# Patient Record
Sex: Female | Born: 1935 | Race: White | Hispanic: No | Smoking: Former smoker
Health system: Southern US, Community
[De-identification: ages and names within clinical notes are randomized; demographics above are authoritative.]

## PROBLEM LIST (undated history)

## (undated) DIAGNOSIS — H547 Unspecified visual loss: Secondary | ICD-10-CM

## (undated) DIAGNOSIS — K219 Gastro-esophageal reflux disease without esophagitis: Secondary | ICD-10-CM

## (undated) DIAGNOSIS — Z8719 Personal history of other diseases of the digestive system: Secondary | ICD-10-CM

## (undated) DIAGNOSIS — U071 COVID-19: Secondary | ICD-10-CM

## (undated) DIAGNOSIS — I209 Angina pectoris, unspecified: Secondary | ICD-10-CM

## (undated) DIAGNOSIS — H9190 Unspecified hearing loss, unspecified ear: Secondary | ICD-10-CM

## (undated) DIAGNOSIS — H548 Legal blindness, as defined in USA: Secondary | ICD-10-CM

## (undated) DIAGNOSIS — I10 Essential (primary) hypertension: Secondary | ICD-10-CM

## (undated) DIAGNOSIS — J449 Chronic obstructive pulmonary disease, unspecified: Secondary | ICD-10-CM

---

## 2001-05-17 ENCOUNTER — Ambulatory Visit (HOSPITAL_COMMUNITY): Admission: RE | Admit: 2001-05-17 | Discharge: 2001-05-17 | Payer: Self-pay | Admitting: Family Medicine

## 2001-05-17 ENCOUNTER — Encounter: Payer: Self-pay | Admitting: Family Medicine

## 2002-05-09 ENCOUNTER — Ambulatory Visit (HOSPITAL_COMMUNITY): Admission: RE | Admit: 2002-05-09 | Discharge: 2002-05-09 | Payer: Self-pay | Admitting: Ophthalmology

## 2002-06-29 ENCOUNTER — Encounter: Payer: Self-pay | Admitting: Family Medicine

## 2002-06-29 ENCOUNTER — Ambulatory Visit (HOSPITAL_COMMUNITY): Admission: RE | Admit: 2002-06-29 | Discharge: 2002-06-29 | Payer: Self-pay | Admitting: Family Medicine

## 2002-09-06 ENCOUNTER — Ambulatory Visit (HOSPITAL_COMMUNITY): Admission: RE | Admit: 2002-09-06 | Discharge: 2002-09-06 | Payer: Self-pay | Admitting: Family Medicine

## 2002-09-06 ENCOUNTER — Encounter: Payer: Self-pay | Admitting: Family Medicine

## 2003-07-19 ENCOUNTER — Ambulatory Visit (HOSPITAL_COMMUNITY): Admission: RE | Admit: 2003-07-19 | Discharge: 2003-07-19 | Payer: Self-pay | Admitting: Family Medicine

## 2003-07-19 ENCOUNTER — Encounter: Payer: Self-pay | Admitting: Family Medicine

## 2003-07-31 ENCOUNTER — Ambulatory Visit (HOSPITAL_COMMUNITY): Admission: RE | Admit: 2003-07-31 | Discharge: 2003-07-31 | Payer: Self-pay | Admitting: Family Medicine

## 2003-07-31 ENCOUNTER — Encounter: Payer: Self-pay | Admitting: Family Medicine

## 2003-10-09 ENCOUNTER — Ambulatory Visit (HOSPITAL_COMMUNITY): Admission: RE | Admit: 2003-10-09 | Discharge: 2003-10-09 | Payer: Self-pay | Admitting: Family Medicine

## 2004-10-21 ENCOUNTER — Ambulatory Visit (HOSPITAL_COMMUNITY): Admission: RE | Admit: 2004-10-21 | Discharge: 2004-10-21 | Payer: Self-pay | Admitting: Family Medicine

## 2004-12-31 ENCOUNTER — Ambulatory Visit (HOSPITAL_COMMUNITY): Admission: RE | Admit: 2004-12-31 | Discharge: 2004-12-31 | Payer: Self-pay | Admitting: Family Medicine

## 2005-10-28 ENCOUNTER — Ambulatory Visit (HOSPITAL_COMMUNITY): Admission: RE | Admit: 2005-10-28 | Discharge: 2005-10-28 | Payer: Self-pay | Admitting: Family Medicine

## 2005-11-04 ENCOUNTER — Ambulatory Visit (HOSPITAL_COMMUNITY): Payer: Self-pay | Admitting: Oncology

## 2005-11-04 ENCOUNTER — Encounter: Admission: RE | Admit: 2005-11-04 | Discharge: 2005-11-04 | Payer: Self-pay | Admitting: Oncology

## 2005-11-04 ENCOUNTER — Encounter (HOSPITAL_COMMUNITY): Admission: RE | Admit: 2005-11-04 | Discharge: 2005-12-04 | Payer: Self-pay | Admitting: Oncology

## 2005-11-10 ENCOUNTER — Encounter (HOSPITAL_COMMUNITY): Payer: Self-pay | Admitting: Oncology

## 2005-11-13 ENCOUNTER — Ambulatory Visit (HOSPITAL_COMMUNITY): Admission: RE | Admit: 2005-11-13 | Discharge: 2005-11-13 | Payer: Self-pay | Admitting: Oncology

## 2005-12-22 ENCOUNTER — Ambulatory Visit (HOSPITAL_COMMUNITY): Admission: RE | Admit: 2005-12-22 | Discharge: 2005-12-22 | Payer: Self-pay | Admitting: Family Medicine

## 2006-02-08 ENCOUNTER — Ambulatory Visit (HOSPITAL_COMMUNITY): Admission: RE | Admit: 2006-02-08 | Discharge: 2006-02-08 | Payer: Self-pay | Admitting: Family Medicine

## 2006-02-23 ENCOUNTER — Encounter (HOSPITAL_COMMUNITY): Admission: RE | Admit: 2006-02-23 | Discharge: 2006-03-25 | Payer: Self-pay | Admitting: Oncology

## 2006-02-23 ENCOUNTER — Encounter: Admission: RE | Admit: 2006-02-23 | Discharge: 2006-02-23 | Payer: Self-pay | Admitting: Oncology

## 2006-02-23 ENCOUNTER — Ambulatory Visit (HOSPITAL_COMMUNITY): Payer: Self-pay | Admitting: Oncology

## 2006-05-17 ENCOUNTER — Encounter: Admission: RE | Admit: 2006-05-17 | Discharge: 2006-05-17 | Payer: Self-pay | Admitting: Oncology

## 2006-05-17 ENCOUNTER — Ambulatory Visit (HOSPITAL_COMMUNITY): Payer: Self-pay | Admitting: Oncology

## 2006-05-17 ENCOUNTER — Encounter (HOSPITAL_COMMUNITY): Admission: RE | Admit: 2006-05-17 | Discharge: 2006-06-16 | Payer: Self-pay | Admitting: Oncology

## 2006-11-22 ENCOUNTER — Ambulatory Visit (HOSPITAL_COMMUNITY): Payer: Self-pay | Admitting: Oncology

## 2006-11-22 ENCOUNTER — Encounter (HOSPITAL_COMMUNITY): Admission: RE | Admit: 2006-11-22 | Discharge: 2006-12-22 | Payer: Self-pay | Admitting: Oncology

## 2006-12-23 ENCOUNTER — Ambulatory Visit (HOSPITAL_COMMUNITY): Admission: RE | Admit: 2006-12-23 | Discharge: 2006-12-23 | Payer: Self-pay | Admitting: Family Medicine

## 2007-05-18 ENCOUNTER — Encounter (HOSPITAL_COMMUNITY): Admission: RE | Admit: 2007-05-18 | Discharge: 2007-06-17 | Payer: Self-pay | Admitting: Oncology

## 2007-05-18 ENCOUNTER — Ambulatory Visit (HOSPITAL_COMMUNITY): Payer: Self-pay | Admitting: Oncology

## 2007-11-16 ENCOUNTER — Ambulatory Visit (HOSPITAL_COMMUNITY): Payer: Self-pay | Admitting: Oncology

## 2007-11-16 ENCOUNTER — Encounter (HOSPITAL_COMMUNITY): Admission: RE | Admit: 2007-11-16 | Discharge: 2007-12-16 | Payer: Self-pay | Admitting: Oncology

## 2007-12-26 ENCOUNTER — Ambulatory Visit (HOSPITAL_COMMUNITY): Admission: RE | Admit: 2007-12-26 | Discharge: 2007-12-26 | Payer: Self-pay | Admitting: Family Medicine

## 2008-03-06 ENCOUNTER — Ambulatory Visit (HOSPITAL_COMMUNITY): Admission: RE | Admit: 2008-03-06 | Discharge: 2008-03-06 | Payer: Self-pay | Admitting: Family Medicine

## 2008-03-25 ENCOUNTER — Emergency Department (HOSPITAL_COMMUNITY): Admission: EM | Admit: 2008-03-25 | Discharge: 2008-03-26 | Payer: Self-pay | Admitting: Emergency Medicine

## 2008-03-29 ENCOUNTER — Ambulatory Visit (HOSPITAL_COMMUNITY): Admission: RE | Admit: 2008-03-29 | Discharge: 2008-03-29 | Payer: Self-pay | Admitting: Urology

## 2008-04-01 ENCOUNTER — Inpatient Hospital Stay (HOSPITAL_COMMUNITY): Admission: EM | Admit: 2008-04-01 | Discharge: 2008-04-03 | Payer: Self-pay | Admitting: Emergency Medicine

## 2008-04-12 ENCOUNTER — Ambulatory Visit (HOSPITAL_BASED_OUTPATIENT_CLINIC_OR_DEPARTMENT_OTHER): Admission: RE | Admit: 2008-04-12 | Discharge: 2008-04-12 | Payer: Self-pay | Admitting: Urology

## 2008-05-15 ENCOUNTER — Encounter (HOSPITAL_COMMUNITY): Admission: RE | Admit: 2008-05-15 | Discharge: 2008-06-14 | Payer: Self-pay | Admitting: Oncology

## 2008-05-15 ENCOUNTER — Ambulatory Visit (HOSPITAL_COMMUNITY): Payer: Self-pay | Admitting: Oncology

## 2008-10-30 ENCOUNTER — Ambulatory Visit (HOSPITAL_COMMUNITY): Payer: Self-pay | Admitting: Oncology

## 2008-10-30 ENCOUNTER — Encounter (HOSPITAL_COMMUNITY): Admission: RE | Admit: 2008-10-30 | Discharge: 2008-11-29 | Payer: Self-pay | Admitting: Oncology

## 2008-12-31 ENCOUNTER — Ambulatory Visit (HOSPITAL_COMMUNITY): Admission: RE | Admit: 2008-12-31 | Discharge: 2008-12-31 | Payer: Self-pay | Admitting: Family Medicine

## 2009-05-22 ENCOUNTER — Ambulatory Visit (HOSPITAL_COMMUNITY): Admission: RE | Admit: 2009-05-22 | Discharge: 2009-05-22 | Payer: Self-pay | Admitting: Family Medicine

## 2009-05-27 ENCOUNTER — Ambulatory Visit (HOSPITAL_COMMUNITY): Admission: RE | Admit: 2009-05-27 | Discharge: 2009-05-27 | Payer: Self-pay | Admitting: Family Medicine

## 2009-07-03 ENCOUNTER — Encounter (INDEPENDENT_AMBULATORY_CARE_PROVIDER_SITE_OTHER): Payer: Self-pay | Admitting: *Deleted

## 2009-07-11 ENCOUNTER — Encounter (INDEPENDENT_AMBULATORY_CARE_PROVIDER_SITE_OTHER): Payer: Self-pay | Admitting: *Deleted

## 2010-01-02 ENCOUNTER — Ambulatory Visit (HOSPITAL_COMMUNITY): Admission: RE | Admit: 2010-01-02 | Discharge: 2010-01-02 | Payer: Self-pay | Admitting: Urology

## 2010-01-14 ENCOUNTER — Ambulatory Visit (HOSPITAL_COMMUNITY): Admission: RE | Admit: 2010-01-14 | Discharge: 2010-01-14 | Payer: Self-pay | Admitting: Family Medicine

## 2010-04-22 ENCOUNTER — Ambulatory Visit (HOSPITAL_COMMUNITY): Admission: RE | Admit: 2010-04-22 | Discharge: 2010-04-22 | Payer: Self-pay | Admitting: Family Medicine

## 2010-12-29 ENCOUNTER — Other Ambulatory Visit (HOSPITAL_COMMUNITY): Payer: Self-pay | Admitting: Family Medicine

## 2010-12-29 DIAGNOSIS — Z139 Encounter for screening, unspecified: Secondary | ICD-10-CM

## 2011-01-25 LAB — CREATININE, SERUM: Creatinine, Ser: 0.69 mg/dL (ref 0.4–1.2)

## 2011-02-03 ENCOUNTER — Ambulatory Visit (HOSPITAL_COMMUNITY)
Admission: RE | Admit: 2011-02-03 | Discharge: 2011-02-03 | Disposition: A | Discharge status: Home / Self Care | Payer: Medicare Other | Source: Ambulatory Visit | Attending: Family Medicine | Admitting: Family Medicine

## 2011-02-03 DIAGNOSIS — Z1231 Encounter for screening mammogram for malignant neoplasm of breast: Secondary | ICD-10-CM | POA: Insufficient documentation

## 2011-02-03 DIAGNOSIS — Z139 Encounter for screening, unspecified: Secondary | ICD-10-CM

## 2011-02-06 ENCOUNTER — Other Ambulatory Visit: Payer: Self-pay | Admitting: Family Medicine

## 2011-02-06 DIAGNOSIS — R928 Other abnormal and inconclusive findings on diagnostic imaging of breast: Secondary | ICD-10-CM

## 2011-02-11 ENCOUNTER — Other Ambulatory Visit: Payer: Self-pay | Admitting: Family Medicine

## 2011-02-11 DIAGNOSIS — R928 Other abnormal and inconclusive findings on diagnostic imaging of breast: Secondary | ICD-10-CM

## 2011-02-17 ENCOUNTER — Other Ambulatory Visit (HOSPITAL_COMMUNITY): Payer: Self-pay | Admitting: Family Medicine

## 2011-02-17 DIAGNOSIS — R928 Other abnormal and inconclusive findings on diagnostic imaging of breast: Secondary | ICD-10-CM

## 2011-02-18 ENCOUNTER — Ambulatory Visit (HOSPITAL_COMMUNITY)
Admission: RE | Admit: 2011-02-18 | Discharge: 2011-02-18 | Disposition: A | Discharge status: Home / Self Care | Payer: Medicare Other | Source: Ambulatory Visit | Attending: Family Medicine | Admitting: Family Medicine

## 2011-02-18 DIAGNOSIS — R928 Other abnormal and inconclusive findings on diagnostic imaging of breast: Secondary | ICD-10-CM | POA: Insufficient documentation

## 2011-03-17 NOTE — Group Therapy Note (Signed)
[name], ROSELAND                ACCOUNT NO.:  [id number]   MEDICAL RECORD NO.:  [id number]          PATIENT TYPE:  INP   LOCATION:  A312                          FACILITY:  [facility]   PHYSICIAN:  Margaretmary Dys, M.D.DATE OF BIRTH:  Mar 20, 1926   DATE OF PROCEDURE:  04/02/2008  DATE OF DISCHARGE:                                 PROGRESS NOTE   SUBJECTIVE:  Patient remains stable.   She says her cough is much better.  Her shortness of breath is better.  The patient continues to smoke and said she clearly will not be able to  stop smoking.  She has no fevers or chills.   OBJECTIVE:  Conscious, alert, comfortable not in acute distress.  VITAL SIGNS:  Blood pressure is 140/57 with a pulse of 66, respirations  18, temperature 97.5, oxygen saturation was 95% on room air.  HEENT EXAM:  Normocephalic, atraumatic.  Oral mucosa was moist with no  exudates.  NECK:  Supple.  No JVD, no lymphadenopathy.  LUNGS:  Reduced air entry bilaterally with occasional wheezing at the  bases.  HEART:  S1-S2 regular.  No S3, S4, gallops or rubs.  ABDOMEN:  Was soft, nontender.  Bowel sounds positive.  No masses  palpable.  EXTREMITIES:  No pitting pedal edema.  No calf induration or tenderness  was noted.   LABORATORY/DIAGNOSTIC DATA:  White blood cell count 15.3, hemoglobin of  12.1, hematocrit 35.2, platelet count 406 with 91% neutrophils.  Sodium  142, potassium 3.4, chloride 108, CO2 28, glucose 123, BUN 15,  creatinine was 0.90.  Blood cultures and urine cultures remain negative  thus far.   ASSESSMENT/PLAN:  1. Acute chronic obstructive pulmonary disease exacerbation with      probable pneumonia.  The patient continues to improve.  The patient      is not hypoxic.  Will continue nebulizer treatment, Solu-Medrol,      and antibiotics.  2. History of hypertension.  The patient's blood pressure is stable at      this time.  3. History of congestive heart failure.  The patient is not in any  evidence of pulmonary edema.   DISPOSITION:  The patient will likely be discharged home tomorrow on a  rapid taper of steroids and also on oral antibiotic therapy.      Margaretmary Dys, M.D.  Electronically Signed     AM/MEDQ  D:  04/02/2008  T:  04/02/2008  Job:  [id number]

## 2011-03-17 NOTE — Discharge Summary (Signed)
[name], [name]                ACCOUNT NO.:  [id number]   MEDICAL RECORD NO.:  [id number]          PATIENT TYPE:  INP   LOCATION:  A312                          FACILITY:  [facility]   PHYSICIAN:  Margaretmary Dys, M.D.DATE OF BIRTH:  08/19/1926   DATE OF ADMISSION:  03/31/2008  DATE OF DISCHARGE:  06/02/2009LH                               DISCHARGE SUMMARY   DISCHARGE DIAGNOSES:  1. Acute chronic obstructive pulmonary disease exacerbation.  2. Pneumonia, possibly right lower lobe pneumonia.   OTHER DIAGNOSES OF NOTE:  1. Chronic chronic obstructive pulmonary disease.  2. Congestive heart failure.  3. Hypertension.  4. Anemia.  5. Coronary artery disease.  6. Glaucoma.  7. History of kidney stones.  8. Macular degeneration.  9. Osteoporosis.  10.Urinary tract infection.  11.Vitamin B12 deficiency.   DISCHARGE MEDICATIONS:  1. Levaquin 750 mg p.o. once a day for the next 5 days.  2. Albuterol inhaler 2.5 mg q.4 h.  3. Aspirin 81 mg p.o. once a day.  4. Diltiazem 180 mg p.o. once a day.  5. Hydrochlorothiazide 5 mg p.o. once a day.  6. Atrovent 0.5 mg q.4 h. p.r.n. as needed.  7. Alphagan ophthalmic drops 0.15% twice a day.  8. Calcium 600 mg p.o. daily.  9. Clarinex 5 mg daily.  10.Lasix 20 mg daily.  11.Vitamin B12 specialized dosing.  12.Centrum tablet once a day.  13.Betimol 0.5 mg eye drops.  14.Timolol drop 1 b.i.d.   CONSULTATIONS OBTAINED:  None.   PERTINENT LABORATORY DATA ON ADMISSION:  The patient's white blood cell  count is 11.6.  Hemoglobin of 11.5.  Hematocrit of 33.  Platelet count  was 406.  Blood cultures were negative.  Sodium was 135, potassium 3.7.  Chloride was 103.  CO2 was 26.  Glucose 111.  BUN of 17.  Creatinine of  0.9.  Chest x-ray with peribronchial thickening and interstitial  prominence.  There was bibasilar atelectasis on infiltrate, small  bilateral effusions and evidence of chronic COPD   HOSPITAL COURSE:  [name] is a  77 year old female who presented to  the emergency room with complaints of cough and sputum production.  The  patient reports that it has progressively gotten severe over the last  few days.  She denies any fevers or chills.  She had no pleuritic chest  pain.   The patient was subsequently admitted, based on the evaluation and  laboratory data mentioned above for suspected chronic obstructive  pulmonary disease.  The patient had bilateral wheezing with rhonchi.  The patient was given nebulizers and also started on steroids, with  improvement in her symptoms.  I saw the patient on April 02, 2008.  The  patient was doing much better.  White count had remained stable.  She  had no evidence of congestive heart failure.  On November 04, 2007 the  patient was seen and did fairly well, was subsequently discharged home  in satisfactory state.   DISPOSITION:  Discharge home.   FOLLOWUP:  The patient is to follow up with her primary care physician,  Dr. Nobie Putnam in the next  3-4 weeks.      Margaretmary Dys, M.D.  Electronically Signed     AM/MEDQ  D:  05/23/2008  T:  05/23/2008  Job:  [id number]

## 2011-03-17 NOTE — H&P (Signed)
[name], [name]                ACCOUNT NO.:  [id number]   MEDICAL RECORD NO.:  [id number]          PATIENT TYPE:  INP   LOCATION:  A312                          FACILITY:  [facility]   PHYSICIAN:  Skeet Latch, DO    DATE OF BIRTH:  Sep 14, 1926   DATE OF ADMISSION:  03/31/2008  DATE OF DISCHARGE:  LH                              HISTORY & PHYSICAL   PRIMARY CARE PHYSICIAN:  Dr. Nobie Putnam   CHIEF COMPLAINT:  Cough.   HISTORY OF PRESENT ILLNESS:  This is a 77 year old Caucasian female who  presents with complaint of cough.  The patient states for last few days  she started having an off-and-on productive cough.  The patient states  that it has become severe in nature and decided to come to the emergency  room to be evaluated.  The patient denies any chest pain, nausea,  vomiting, diarrhea or abdominal discomfort.  The patient does have a  history of CHF and COPD.   PAST MEDICAL HISTORY:  Includes:  1. CHF.  2. COPD.  3. Hypertension.  4. Anemia.  5. CAD.  6. Glaucoma.  7. Kidney stones.  8. Macular degeneration.  9. Osteoporosis.  10.Urinary tract infections.  11.Vitamin B12 deficiency.   PAST SURGICAL HISTORY:  Positive for breast lumpectomy and lithotripsy.   SOCIAL HISTORY:  Nondrinker, no history of illicit drug use.  The  patient has been a 60-plus-year one-pack-per-day smoker, states that she  cut down to approximately five to six cigarettes per day over the last  few months.  The patient does live alone.   ALLERGIES:  1. PENICILLIN.  2. SULFA.  3. BONIVA.  4. STRAWBERRIES.  5. NITROFURANTOIN.   HOME MEDICATIONS:  1. Albuterol inhaler as needed.  2. Aspirin 81 mg daily.  3. Diltiazem 180 mg daily.  4. Hydrochlorothiazide 25 mg daily.  5. Ipratropium bromide inhaler as needed.  6. Alphagan ophthalmic drops 0.15% twice a day.  7. Calcium 600 mg daily.  8. Clarinex 5 mg daily.  9. Lasix 20 mg daily.  10.Vitamin B12 specialized dosing.  11.Cipro 500 mg twice  a day.  12.PreserVision tablets p.o. daily.  13.Centrum tablet daily.  14.Refresh P.M. at night.  15.Soothe XP.  16.Betimol 0.5% drops.  17.Timolol maleate one drop twice a day.   REVIEW OF SYSTEMS:  CONSTITUTIONAL:  No weight gain, weight loss, fever,  chills.  HEENT:  Unremarkable.  CARDIOVASCULAR:  No palpitations, chest  pain.  RESPIRATORY:  Positive for cough and some shortness of breath.  GASTROINTESTINAL:  No nausea, vomiting, diarrhea, abdominal pain, bloody  stools.  GENITOURINARY:  Unremarkable.  MUSCULOSKELETAL:  Unremarkable.  SKIN:  Unremarkable.  NEUROLOGIC:  Unremarkable.  PSYCHIATRIC:  Unremarkable.   PHYSICAL EXAMINATION:  VITAL SIGNS:  Temperature is 97.6, pulse 83,  respirations 20, blood pressure 127/58, saturating 93% on room air.  GENERAL:  Well-developed, well-nourished, well-hydrated, in no acute  distress.  HEENT:  Head is atraumatic, normocephalic.  Eyes:  PERRL.  EOMI.  Neck:  Soft, supple, nontender, nondistended.  She does have some redness  around both eyes with slight  scleral injection noted.  CARDIOVASCULAR:  Regular rate and rhythm.  No murmurs, rubs, gallops.  RESPIRATORY:  She has rales, positive wheezing bilaterally with rhonchi.  ABDOMEN:  Soft, nontender, nondistended.  Positive bowel sounds.  No  rigidity or guarding.  EXTREMITIES:  No clubbing, cyanosis or edema.  NEUROLOGIC:  Cranial nerves II-XII grossly intact.  SKIN:  Warm, good color.   LABORATORIES:  PTT is 38, PT 15.5, INR is 1.2.  White count 11.6,  hemoglobin 11.5, hematocrit 33.0, platelets 406.  So far, blood cultures  are negative.  Sodium 135, potassium 3.7, chloride 103, CO2 26, glucose  111, BUN 17, creatinine 0.9.   RADIOLOGIC STUDIES:  Chest x-ray shows:  1. Stable cardiomegaly, peribronchial thickening, and interstitial      prominence.  2. Bibasilar atelectasis or infiltrates, right greater than left.  3. Small bilateral effusions.  4. COPD.   ASSESSMENT:  1.  Pneumonia.  2. History chronic obstructive pulmonary disease.  3. History of congestive heart failure.  4. History of hypertension.  5. History of anemia.  6. History of coronary artery disease.  7. History of glaucoma.   PLAN:  1. The patient be admitted to the service of [facility].  2. For her pneumonia, the patient will be placed on IV antibiotics.      Will get sputum cultures and sensitivities.  Will also add blood      cultures x2.  The patient will receive breathing treatments every 4      hours with Xopenex and Atrovent and as needed.  We will place      incentive spirometry next to the patient's bedside.  3. Will add some IV steroids to her regimen, low-dose at this time, to      see if this improves some of her breathing difficulties.  4. For her other medical problems which include her CHF, hypertension      and CAD, the patient will placed on her home medications at this      time.  5. Lastly, the patient will placed on DVT as well as GI prophylaxis.      Skeet Latch, DO  Electronically Signed     SM/MEDQ  D:  04/01/2008  T:  04/01/2008  Job:  [id number]   cc:   Patrica Duel, M.D.  Fax: [phone]

## 2011-03-17 NOTE — Group Therapy Note (Signed)
NAMEMICHELYN, [name]                ACCOUNT NO.:  [id number]   MEDICAL RECORD NO.:  [id number]          PATIENT TYPE:  INP   LOCATION:  A312                          FACILITY:  [facility]   PHYSICIAN:  Lucita Ferrara, MD         DATE OF BIRTH:  1926-04-06   DATE OF PROCEDURE:  04/01/2008  DATE OF DISCHARGE:                                 PROGRESS NOTE   Patient examined by bedside.  Full history and physical examination by  Dr. Skeet Latch reviewed.  The patient was admitted last night with  cough, shortness of breath.  Her symptoms started after she had some  sort of anaphylactic reaction or allergic reaction to Macrodantin.  Note, she has had recent urological procedures for stone.  She had  lithotripsy.  She apparently had some hydronephrosis, she denies any  urinary symptoms as of now.   OBJECTIVE:  VITALS:  Temperature 97.5, pulse 76, respirations 22, blood  pressure 122/59.  HEENT:  Normocephalic, atraumatic.  Sclerae anicteric.  NECK:  Supple, no JVD, no carotid bruits.  CARDIOVASCULAR:  S1, S2, regular rate, rhythm.  No murmurs, rubs or  clicks.  ABDOMEN:  Soft, nontender, nondistended, positive bowel sounds.  LUNGS:  Clear to auscultation bilaterally.  No rhonchi, rales or  wheezes.  EXTREMITIES:  No clubbing, cyanosis or edema.   LABORATORY DATA:  BMET within normal limits.  INR 1.2.  CBC hemoglobin  11.5, hematocrit 33.   ASSESSMENT/PLAN:  Admitted with questionable pneumonia and chronic  obstructive pulmonary disease exacerbation.  Her chest x-ray did show  bilateral atelectasis or infiltrates, right greater than left, there is  peribronchial thickening and interstitial prominence.  Will continue  current treatment with nebulizer treatment, Solu-Medrol, continue  antibiotics.  Sputum cultures and sensitivities pending.  Continue  Xopenex and Solu-Medrol.  It seems that her congestive heart failure is  stable.  Will get beta-natriuretic peptide regardless.  In addition,  will watch her renal function, stable now; per family member, she does  have a hydronephrosis.  Will monitor her renal function and if it  deteriorates,  she is requesting to be transferred to [facility] for  treatment.      Lucita Ferrara, MD  Electronically Signed     RR/MEDQ  D:  04/01/2008  T:  04/01/2008  Job:  [id number]

## 2011-03-17 NOTE — Op Note (Signed)
NAMEJODILYN, [name]                ACCOUNT NO.:  [id number]   MEDICAL RECORD NO.:  [id number]          PATIENT TYPE:  AMB   LOCATION:  NESC                         FACILITY:  [facility]   PHYSICIAN:  Heloise Purpura, MD      DATE OF BIRTH:  1926/03/10   DATE OF PROCEDURE:  04/12/2008  DATE OF DISCHARGE:                               OPERATIVE REPORT   PREOPERATIVE DIAGNOSIS:  Left ureteral calculus.   POSTOPERATIVE DIAGNOSIS:  Left ureteral calculus.   PROCEDURES:  1. Cystoscopy.  2. Left retrograde pyelography.  3. Left ureteroscopy with laser lithotripsy and stone removal.  4. Left ureteral stent placement (6 x 24).   SURGEON:  Heloise Purpura, MD.   ANESTHESIA:  General.   COMPLICATIONS:  None.   INDICATIONS FOR PROCEDURE:  [name] is a 77 year old female who was  found to have a left ureteral calculus on CT imaging after presenting  with a left-sided abdominal pain.  She underwent ESWL of her stone at  the beginning of May.  She has had some persistent pain, although has  passed some fragments.  She underwent a repeat CT scan which  demonstrated at least one small persistent distal left ureteral calculus  and a possible large calcification either within the ureter or adjacent  pelvic vasculature.  After discussing management options, she elected to  proceed with ureteroscopic laser lithotripsy and the above procedures.  Potential risks, complications, and alternative options were discussed  with the patient and informed consent was obtained.  In addition, the  patient was recently admitted to the hospital for pulmonary edema and  possible pneumonia.  She was treated for this and did receive medical  clearance prior to her procedure.   DESCRIPTION OF PROCEDURE:  She was taken to the operating room and a  general anesthetic was administered.  She was given preoperative  antibiotics, placed in the dorsal lithotomy position, and prepped and  draped in the usual sterile fashion.   Next, a preoperative time-out was  performed.  Cystourethroscopy was then performed which demonstrated a  normal bladder with the ureteral orifices in the normal anatomic  position.  There was no evidence for any bladder tumors, stones, or  other mucosal pathology.  Attention then turned to the left ureteral  orifice and a 6-French ureteral catheter was used to intubate the left  ureteral orifice and obturate was injected.  This demonstrated a large  filling defect in the mid ureter with proximal dilation of the ureter  and renal pelvis.  A 0.038 sensor guidewire was then advanced through  the ureteral catheter past the stone and up into the renal pelvis with  relative ease.  The cystoscope was removed and the 6-French ureteroscope  was advanced next to the wire and the filling defect did correspond to a  large mid ureteral calculus.  Holmium laser was then used to fragment  the stone at a setting of 0.8 joules and 8 Hz.  Once the stone was  adequately fragmented, all stone fragments were removed via the nitinol  basket.  The patient's bladder was then emptied  and stenoses the wire  was backloaded over the cystoscope.  A 6 x 24 double-J ureteral stent  was then advanced over the wire using Seldinger technique and  appropriately positioned under fluoroscopic and cystoscopic guidance.  The wire was removed and a  good curl was noted in the renal pelvis as well as in the bladder.  A  string tether was left in place.  The patient appeared to tolerate the  procedure well without complications.  She was able to be awakened and  transferred to the recovery unit in satisfactory condition.      Heloise Purpura, MD  Electronically Signed     LB/MEDQ  D:  04/12/2008  T:  04/12/2008  Job:  [id number]

## 2011-04-07 ENCOUNTER — Inpatient Hospital Stay (HOSPITAL_COMMUNITY)
Admission: EM | Admit: 2011-04-07 | Discharge: 2011-04-09 | DRG: 311 | Disposition: A | Discharge status: Home / Self Care | Payer: Medicare Other | Attending: Internal Medicine | Admitting: Internal Medicine

## 2011-04-07 ENCOUNTER — Emergency Department (HOSPITAL_COMMUNITY): Payer: Medicare Other

## 2011-04-07 DIAGNOSIS — I200 Unstable angina: Principal | ICD-10-CM | POA: Diagnosis present

## 2011-04-07 DIAGNOSIS — I10 Essential (primary) hypertension: Secondary | ICD-10-CM | POA: Diagnosis present

## 2011-04-07 DIAGNOSIS — J449 Chronic obstructive pulmonary disease, unspecified: Secondary | ICD-10-CM | POA: Diagnosis present

## 2011-04-07 DIAGNOSIS — H409 Unspecified glaucoma: Secondary | ICD-10-CM | POA: Diagnosis present

## 2011-04-07 DIAGNOSIS — F17200 Nicotine dependence, unspecified, uncomplicated: Secondary | ICD-10-CM | POA: Diagnosis present

## 2011-04-07 DIAGNOSIS — J4489 Other specified chronic obstructive pulmonary disease: Secondary | ICD-10-CM | POA: Diagnosis present

## 2011-04-07 DIAGNOSIS — I2510 Atherosclerotic heart disease of native coronary artery without angina pectoris: Secondary | ICD-10-CM | POA: Diagnosis present

## 2011-04-07 DIAGNOSIS — M810 Age-related osteoporosis without current pathological fracture: Secondary | ICD-10-CM | POA: Diagnosis present

## 2011-04-07 DIAGNOSIS — H3530 Unspecified macular degeneration: Secondary | ICD-10-CM | POA: Diagnosis present

## 2011-04-07 DIAGNOSIS — J8410 Pulmonary fibrosis, unspecified: Secondary | ICD-10-CM | POA: Diagnosis present

## 2011-04-07 DIAGNOSIS — J189 Pneumonia, unspecified organism: Secondary | ICD-10-CM | POA: Diagnosis present

## 2011-04-07 HISTORY — DX: Essential (primary) hypertension: I10

## 2011-04-07 LAB — CBC
HCT: 41.8 % (ref 36.0–46.0)
MCH: 27.1 pg (ref 26.0–34.0)
MCHC: 32.3 g/dL (ref 30.0–36.0)
MCV: 83.8 fL (ref 78.0–100.0)
Platelets: 419 10*3/uL — ABNORMAL HIGH (ref 150–400)
RDW: 14.9 % (ref 11.5–15.5)

## 2011-04-07 LAB — CARDIAC PANEL(CRET KIN+CKTOT+MB+TROPI)
CK, MB: 3 ng/mL (ref 0.3–4.0)
Relative Index: INVALID (ref 0.0–2.5)
Total CK: 36 U/L (ref 7–177)

## 2011-04-07 LAB — BASIC METABOLIC PANEL
CO2: 23 mEq/L (ref 19–32)
Chloride: 104 mEq/L (ref 96–112)
GFR calc non Af Amer: >60 mL/min (ref >60)
Glucose, Bld: 100 mg/dL — ABNORMAL HIGH (ref 70–99)
Potassium: 3.6 mEq/L (ref 3.5–5.1)
Sodium: 138 mEq/L (ref 135–145)

## 2011-04-07 LAB — DIFFERENTIAL
Eosinophils Relative: 2 % (ref 0–5)
Lymphocytes Relative: 33 % (ref 12–46)
Lymphs Abs: 3 10*3/uL (ref 0.7–4.0)
Neutro Abs: 5.1 10*3/uL (ref 1.7–7.7)

## 2011-04-08 ENCOUNTER — Inpatient Hospital Stay (HOSPITAL_COMMUNITY): Payer: Medicare Other

## 2011-04-08 ENCOUNTER — Encounter (HOSPITAL_COMMUNITY): Payer: Self-pay | Admitting: Radiology

## 2011-04-08 DIAGNOSIS — R079 Chest pain, unspecified: Secondary | ICD-10-CM

## 2011-04-08 DIAGNOSIS — I369 Nonrheumatic tricuspid valve disorder, unspecified: Secondary | ICD-10-CM

## 2011-04-08 LAB — CBC
Hemoglobin: 12.3 g/dL (ref 12.0–15.0)
MCHC: 33 g/dL (ref 30.0–36.0)
RDW: 14.7 % (ref 11.5–15.5)
WBC: 9 10*3/uL (ref 4.0–10.5)

## 2011-04-08 LAB — COMPREHENSIVE METABOLIC PANEL
CO2: 25 mEq/L (ref 19–32)
Calcium: 9.2 mg/dL (ref 8.4–10.5)
Creatinine, Ser: 0.63 mg/dL (ref 0.4–1.2)
GFR calc Af Amer: >60 mL/min (ref >60)
GFR calc non Af Amer: >60 mL/min (ref >60)
Glucose, Bld: 84 mg/dL (ref 70–99)

## 2011-04-08 LAB — CARDIAC PANEL(CRET KIN+CKTOT+MB+TROPI)
Relative Index: INVALID (ref 0.0–2.5)
Relative Index: INVALID (ref 0.0–2.5)
Total CK: 31 U/L (ref 7–177)
Total CK: 32 U/L (ref 7–177)
Troponin I: <0.3 ng/mL (ref <0.30)

## 2011-04-08 LAB — DIFFERENTIAL
Basophils Absolute: 0.1 10*3/uL (ref 0.0–0.1)
Basophils Relative: 1 % (ref 0–1)
Monocytes Absolute: 1 10*3/uL (ref 0.1–1.0)
Neutro Abs: 4.3 10*3/uL (ref 1.7–7.7)
Neutrophils Relative %: 47 % (ref 43–77)

## 2011-04-08 LAB — LIPID PANEL: VLDL: 15 mg/dL (ref 0–40)

## 2011-04-08 LAB — D-DIMER, QUANTITATIVE: D-Dimer, Quant: 1.46 ug/mL-FEU — ABNORMAL HIGH (ref 0.00–0.48)

## 2011-04-08 MED ORDER — IOHEXOL 350 MG/ML SOLN
100.0000 mL | Freq: Once | INTRAVENOUS | Status: AC | PRN
  Administered 2011-04-08: 100 mL via INTRAVENOUS

## 2011-04-09 ENCOUNTER — Inpatient Hospital Stay (HOSPITAL_COMMUNITY): Payer: Medicare Other

## 2011-04-09 ENCOUNTER — Other Ambulatory Visit (HOSPITAL_COMMUNITY): Payer: Medicare Other

## 2011-04-09 DIAGNOSIS — R072 Precordial pain: Secondary | ICD-10-CM

## 2011-04-09 LAB — BASIC METABOLIC PANEL
Chloride: 107 mEq/L (ref 96–112)
GFR calc Af Amer: >60 mL/min (ref >60)
Potassium: 3.4 mEq/L — ABNORMAL LOW (ref 3.5–5.1)
Sodium: 137 mEq/L (ref 135–145)

## 2011-04-09 NOTE — Consult Note (Signed)
NAMEMANDI, [name] NO.:  [id number]  MEDICAL RECORD NO.:  [id number]  LOCATION:  A330                          FACILITY:  [facility]  PHYSICIAN:  Dawn Sidle, MD DATE OF BIRTH:  [birth year]  DATE OF CONSULTATION: DATE OF DISCHARGE:                                CONSULTATION   ADDENDUM:  PRIMARY CARE PHYSICIAN:  [name], M.D.  REQUESTING SERVICE:  [facility] hospitalist team.  Please see the full dictated cardiology consultation by Wende Bushy, [facility].  SUMMARY:  [name] is an 77 year old woman with possible history of coronary artery disease based on limited information, perhaps diagnosed with testing in [place], [place] approximately 10 years ago, although with cardiac catheterization in [place] from the late 1980s demonstrating normal coronary arteries per the patient's daughter. Additional problems include COPD with ongoing tobacco abuse of one-half to one pack per day over the last 60 years, hypertension, osteoporosis, glaucoma, and macular degeneration.  She is admitted to the hospital presenting with a recent episode of chest pain.  She states that she felt a fullness and pulling sensation in her chest after working in the garden, radiating up to the neck.  She has also had some recent coughing, intermittently productive, although no fevers or chills. Chest x-ray from June 5 showed bronchitic changes with bibasilar scarring versus atelectasis, borderline increase in heart size, although no effusions or infiltrates.  ECG shows abnormal findings with inferior and anterolateral T-wave inversions noted on initial tracing, subsequently resolved in followup.  Cardiac markers have been reassuring with troponin-I levels less than 0.30, peak CK-MB of 3.5, BNP was 279. D-dimer was mildly increased at 1.4, and a CT angiogram is pending.  We have been consulted to assist with her management.  ALLERGIES:  PENICILLIN, SULFA DRUGS,  NITROFURANTOIN, AND PREDNISONE.  PRESENT MEDICATIONS: 1. Aspirin 81 mg p.o. daily. 2. Alphagan eyedrops twice daily. 3. Cardizem CD 180 mg p.o. daily. 4. Benadryl 50 mg p.o. daily. 5. Trusopt eyedrops b.i.d. 6. Lovenox 30 mg subcu q.24 h. 7. Nicotine patch 24 mg daily. 8. Timoptic eyedrops b.i.d.  PHYSICAL EXAMINATION:  VITAL SIGNS:  Recent vital signs show temperature 97.8 degrees, heart rate 68 and regular, respirations 18, blood pressure 106/65, ox saturation is 93% on room air. GENERAL:  This is a somewhat frail, chronically ill-appearing elderly woman, in no acute distress without active chest pain. HEENT:  Conjunctiva and lids are normal.  Oropharynx is clear with poor dentition. NECK:  Supple.  No elevated JVP.  No carotid bruits. LUNGS:  Exhibit diminished breath sounds throughout.  No active wheezing or labored breathing. CARDIAC:  Reveals a regular rate and rhythm, soft systolic murmur at the apex, no S3 gallop or pericardial rub. ABDOMEN:  Soft, nontender.  Bowel sounds are present. EXTREMITIES:  Exhibit no pitting edema.  Distal pulses are 1 to 2+.SKIN:  Warm and dry. MUSCULOSKELETAL:  Kyphosis is noted. NEUROPSYCHIATRIC:  The patient is alert and oriented x3.  Affect is grossly appropriate.  LABORATORY DATA:  WBCs 9.0, hemoglobin 12.3, hematocrit 37.3, platelets 350, D-dimer 1.4.  Sodium 139, potassium 3.6, chloride 109, bicarb 25, glucose 84, BUN 17, creatinine 0.6, AST 15, ALT 9.  IMPRESSION: 1. Presentation with recent chest pain, consistent with angina.  The     patient in retrospect has had similar episodes in the past over the     last few years.  Cardiac markers argue against an acute coronary     syndrome.  She does have ECG abnormalities as noted that are     concerning for underlying ischemic heart disease. 2. Hypertension. 3. Chronic obstructive pulmonary disease with longstanding tobacco     abuse history that continues.  RECOMMENDATIONS:   Situation was discussed with the patient and her daughter.  [name] is not inclined to pursue invasive cardiac evaluation, specifically declining cardiac catheterization, and is also concerned about considering a Myoview study as a "chemical" stress evaluation.  After discussing a variety of options, the plan at this point is to proceed with an exercise echocardiogram for risk stratification, with fairly high suspicion that she does have underlying CAD.  It is likely that medical therapy will be pursued long-term, unless she has particularly high risk features, and agrees to proceed with further invasive testing.  Smoking cessation was discussed, although she is not interested in smoking cessation at this time. Follow-up lipid panel is pending.  We will follow with you.     Dawn Sidle, MD     SGM/MEDQ  D:  04/08/2011  T:  04/08/2011  Job:  [id number]  cc:   [facility]  [name], M.D. Fax: [phone]  Electronically Signed by Nona Dell MD on 04/09/2011 08:41:15 AM

## 2011-04-10 NOTE — Discharge Summary (Signed)
[name], [name]                ACCOUNT NO.:  [id number]  MEDICAL RECORD NO.:  [id number]  LOCATION:  A330                          FACILITY:  [facility]  PHYSICIAN:  Isidor Holts, M.D.  DATE OF BIRTH:  05/15/1926  DATE OF ADMISSION:  04/07/2011 DATE OF DISCHARGE:  06/07/2012LH                              DISCHARGE SUMMARY   PRIMARY MD:  Kirk Ruths, MD  DISCHARGE DIAGNOSES: 1. Acute coronary syndrome, status post exercise echocardiogram on     April 09, 2011. 2. Left lower lobe "walking" pneumonia.  Incidental finding on chest     CT angiogram on April 08, 2011. 3. Chronic obstructive pulmonary disease/pulmonary fibrosis. 4. Smoking history. 5. Hypertension. 6. Glaucoma/macular degeneration. 7. Osteoporosis.  DISCHARGE MEDICATIONS: 1. Avelox 400 mg p.o. daily for 7 days. 2. Nitroglycerin 0.4 mg 1 tablet sublingually p.r.n. q.5 minutes x3     for chest pain. 3. Albuterol nebulizer 1 treatment p.r.n. q.i.d. for shortness of     breath. 4. Alphagan ophthalmic solution 1 drop each eye t.i.d. 5. Artificial tears 1 drop each eye p.r.n. for dry eyes. 6. Enteric-coated aspirin 81 mg p.o. daily. 7. Calcium tablets/vitamin D 600 mg 1 tablet p.o. b.i.d. 8. Centrum multivitamins 1 tablet p.o. daily. 9. Clarinex 5 mg p.o. p.r.n. daily for allergies. 10.Diltiazem CD 180 mg p.o. daily. 11.Ipratropium Nebulizer 1 treatment p.r.n. q.i.d. for wheeze. 12.Lasix 20 mg p.o. p.r.n. daily. 13.PreserVision multivitamins 1 tablet p.o. daily. 14.Timolol/dorzolamide 1 drop each eye b.i.d. 15.Vitamin B12 in preadmission dosage.  PROCEDURES: 1. Chest x-ray on April 07, 2011.  This showed bronchitic changes.     There was bibasilar scarring atelectasis borderline heart size. 2. Chest CT angiogram on April 08, 2011.  There were no CT findings for     pulmonary embolism.  There was marked tortuosity ectasia and     atherosclerotic change involving the thoracic aorta, but no focal     aneurysm or  dissection, stable emphysematous changes in areas of     pulmonary fibrosis.  Peribronchial thickening and bronchial debris     in left lower lobe infiltrate.  Extensive coronary artery     calcifications are noted.  CONSULTATIONS:  Dr. Nona Dell, cardiologist.  ADMISSION HISTORY:  See H and P notes of April 07, 2011, dictated by Dr. Lilly Cove.  However, in brief, this is an 77 year old female, with known history of coronary artery disease, previous congestive heart failure, COPD, glaucoma, hypertension, macular degeneration, osteoporosis, history of pernicious anemia/Vitamin B12 deficiency, smoking history, presenting with sudden onset of central chest pain radiating to neck, 4 hours prior to presentation, not associated with diaphoresis, nausea, or dyspnea.  She was subsequently admitted for further evaluation, investigation, and management.  CLINICAL COURSE: 1. Chest pain.  The patient does indeed have risk factors for coronary     artery disease, including hypertension and smoking history.  She was     placed on telemetric monitoring, had no recurrences of chest pain.     Cardiac enzymes were cycled and remained unelevated.  There were no     acute ischemic changes on 12-lead EKG.  D-dimer was found to be  elevated at 1.46, necessitating arranging a chest CT angiogram, which     was carried out on April 08, 2011, showed no evidence of pulmonary     embolism, but did demonstrate left lower lobe infiltrate consistent     with pneumonia.  She also did have extensive coronary artery     calcifications and atherosclerotic aortic disease.  Cardiology     consultation was kindly provided by Dr. Nona Dell, who     persuaded the patient to have an exercise echocardiogram on April 09, 2011. Details of findings were pending at the time of this     dictation, however, per my discussion with Dr. Diona Browner, she     showed no evidence of overt inducible ischemia, although  findings     were somewhat equivocal.  He has recommended continued low-dose     aspirin, calcium-channel blocker and p.r.n. nitroglycerin.  He     will arrange outpatient followup with at his office, in due     course.  2. Left lower lobe pneumonia.  As described above, this was an     incidental finding on chest CT angiogram of April 08, 2011.  The     patient had no pyrexia.  WBC was within normal limits.  She had no     productive cough.  This may be a "walking" pneumonia.  She has been     placed on a 7-day course of oral Avelox.  3. COPD/pulmonary fibrosis.  This was stable.  There were no problems     referable to this.  4. Smoking history.  The patient smokes half packet to one packet of     cigarettes per day.  She has been counseled appropriately, but     seems extremely reluctant to quit.  5. Hypertension.  This was controlled during the course of the     patient's hospitalization.  DISPOSITION:  The patient as of April 09, 2011, was asymptomatic, very keen to be discharged when there is no new issues.  She was therefore discharged accordingly.  ACTIVITY:  As tolerated.  DIET:  Heart healthy.  FOLLOWUP INSTRUCTIONS:  The patient is to follow up routinely with her primary MD, Dr. Karleen Hampshire, per prior scheduled appointment.  She will follow up with Dr. Nona Dell, [facility] and date to be determined, in 3 to 4 weeks.  Dr. Ival Bible office will contact the patient to schedule an appointment.     Isidor Holts, M.D.     CO/MEDQ  D:  04/09/2011  T:  04/10/2011  Job:  [id number]  cc:   Kirk Ruths, M.D. Fax: [phone]  Jonelle Sidle, MD [phone] [address] [place], [place] [address]  Electronically Signed by Isidor Holts M.D. on 04/10/2011 03:05:20 PM

## 2011-04-10 NOTE — H&P (Signed)
NAMELITTLE, BASHORE                ACCOUNT NO.:  [id number]  MEDICAL RECORD NO.:  [id number]  LOCATION:  A330                          FACILITY:  [facility]  PHYSICIAN:  Wilson Singer, M.D.DATE OF BIRTH:  16-Jul-1926  DATE OF ADMISSION:  04/07/2011 DATE OF DISCHARGE:  LH                             HISTORY & PHYSICAL   CHIEF COMPLAINT:  Chest pain.  HISTORY OF PRESENT ILLNESS:  This is a very pleasant 77 year old lady who had sudden onset of central chest pain which radiated to her neck four and half hour ago.  This was at noon today.  The pain was aching in nature, and she describes it as 6/10.  It was not associated with any sweating, nausea, vomiting, or dyspnea.  She denies any injury to her chest.  She was using the hose to water her plants in her yard at that time.  By the time, she presented to the emergency room, approximately 1 hour later, the pain had eased off somewhat.  At this point, an electrocardiogram was done, and I have personally reviewed this and it is entirely normal with no acute ST-T wave changes.  The pain is now gone.  She has not had a cough or fever or any hemoptysis.  She is a smoker.  There is a history of coronary artery disease per patient, but this was very remote, more than 10 years ago.  I cannot find any evidence in eChart regarding her health issues because most of her problems were in [place], [place].  PAST MEDICAL HISTORY:  Apparent coronary artery disease many years ago, previous history of congestive heart failure, COPD, glaucoma, hypertension, macular degeneration, osteoporosis, history of pernicious anemia, history of vitamin B12 deficiency.  SOCIAL HISTORY:  She lives alone.  She had been divorced, and her ex- husband I believe has passed away.  She continues to smoke half to one pack of cigarettes per day.  She does not drink alcohol.  She is retired.  FAMILY HISTORY:  Noncontributory.  ALLERGIES:  PENICILLIN,  SULFA.  MEDICATIONS:  Aspirin 81 mg daily, diltiazem 180 mg daily.  She has several eyedrops and will need to reconcile her medications.  REVIEW OF SYSTEMS:  Apart from the symptoms mentioned above, there are no other symptoms referable to all systems reviewed.  PHYSICAL EXAMINATION:  VITAL SIGNS:  Temperature 97.9, blood pressure 129/66, pulse 69, saturation 99% on room air, respiratory rate 12-14. GENERAL:  She looks systemically well and is not in any acute pain. There is no peripheral or central cyanosis.  She is not clinically anemic.  There is no clubbing.  She is not jaundiced. CARDIOVASCULAR:  Heart sounds are present without a gallop rhythm. There are no murmurs.  Jugular venous pressure is not raised. RESPIRATORY:  Lung fields show bilateral equal air entry with reduced breath sounds.  There are no crackles or wheezes.  There is no bronchial breathing.  There is no pleural rub.  Chest wall; there is a degree of tenderness in the lower chest wall at the site of her original pain, and she is not sure whether this is the same pain or not. ABDOMEN:  Soft, nontender with no  hepatosplenomegaly.  There are no masses felt. NEUROLOGIC:  She is alert and oriented without any focal neurologic signs. SKIN:  There is no obvious skin lesions or rashes.  INVESTIGATIONS:  Electrocardiogram as I mentioned above shows normal sinus rhythm with no acute ST-T wave changes.  Chest x-ray is unremarkable also with some bronchitic changes and bibasilar scarring or atelectasis.  Lab work shows a hemoglobin of 13.5, white blood cell count 9.1, platelets 419.  Sodium 138, potassium 3.6, bicarbonate 23, BUN 18, creatinine 0.61, glucose 100, troponin is less than 0.3.  CPK 46.  Pro BNP is normal at 279.2.  PROBLEM LIST: 1. Chest pain, possibly cardiac by description. 2. Remote history of congestive heart failure, compensated. 3. Hypertension. 4. COPD. 5. Tobacco abuse. 6. Macular  degeneration. 7. Glaucoma. 8. Osteoporosis.  PLAN: 1. Admit to telemetry. 2. Serial cardiac enzymes and ECGs. 3. Cardiology consultation.  I think that this lady may well benefit     from a stress test.  She has a good quality of life and if     cardiology feels cardiac catheterization is appropriate.  I think     this would be also appropriate in the current setting, especially     if the stress test is positive.  Further recommendations will depend on the patient's hospital progress.     Wilson Singer, M.D.     NCG/MEDQ  D:  04/07/2011  T:  04/07/2011  Job:  [id number]  cc:   Kirk Ruths, M.D. Fax: [phone]  Electronically Signed by Lilly Cove M.D. on 04/10/2011 10:08:51 AM

## 2011-07-22 LAB — DIFFERENTIAL
Eosinophils Absolute: 0
Eosinophils Relative: 0
Lymphs Abs: 2.6
Monocytes Relative: 6
Neutrophils Relative %: 69

## 2011-07-22 LAB — CBC
HCT: 41.1
MCV: 84
RBC: 4.9
WBC: 11.1 — ABNORMAL HIGH

## 2011-07-29 LAB — CBC
HCT: 36.2
Hemoglobin: 11.5 — ABNORMAL LOW
Hemoglobin: 12.4
MCHC: 34.3
MCV: 83.3
RDW: 15.3
RDW: 15.8 — ABNORMAL HIGH

## 2011-07-29 LAB — BASIC METABOLIC PANEL
CO2: 26
Calcium: 8.2 — ABNORMAL LOW
Calcium: 8.3 — ABNORMAL LOW
GFR calc Af Amer: >60
GFR calc non Af Amer: >60
Glucose, Bld: 111 — ABNORMAL HIGH
Potassium: 3.6
Sodium: 135
Sodium: 143

## 2011-07-29 LAB — DIFFERENTIAL
Basophils Absolute: 0.1
Basophils Relative: 0
Basophils Relative: 0
Eosinophils Absolute: 1.1 — ABNORMAL HIGH
Eosinophils Absolute: 1.3 — ABNORMAL HIGH
Eosinophils Relative: 10 — ABNORMAL HIGH
Eosinophils Relative: 9 — ABNORMAL HIGH
Lymphs Abs: 2.3
Monocytes Absolute: 1.6 — ABNORMAL HIGH
Monocytes Relative: 14 — ABNORMAL HIGH
Neutro Abs: 6.5
Neutro Abs: 7.9 — ABNORMAL HIGH

## 2011-07-29 LAB — PROTIME-INR: INR: 1.2

## 2011-07-29 LAB — CULTURE, BLOOD (ROUTINE X 2)
Culture: NO GROWTH
Report Status: 6062009

## 2011-07-30 LAB — BASIC METABOLIC PANEL
Chloride: 107
Creatinine, Ser: 0.92
GFR calc Af Amer: >60
Sodium: 139

## 2011-07-30 LAB — DIFFERENTIAL
Basophils Absolute: 0.1
Eosinophils Absolute: 0
Eosinophils Absolute: 0
Lymphocytes Relative: 7 — ABNORMAL LOW
Lymphocytes Relative: 25
Lymphs Abs: 0.9
Lymphs Abs: 1
Monocytes Absolute: 0.6
Monocytes Relative: 2 — ABNORMAL LOW
Monocytes Relative: 3
Neutro Abs: 14 — ABNORMAL HIGH
Neutro Abs: 16 — ABNORMAL HIGH
Neutro Abs: 7.2
Neutrophils Relative %: 91 — ABNORMAL HIGH
Neutrophils Relative %: 92 — ABNORMAL HIGH

## 2011-07-30 LAB — COMPREHENSIVE METABOLIC PANEL
BUN: 15
CO2: 28
Calcium: 9.1
Creatinine, Ser: 0.9
GFR calc non Af Amer: >60
Glucose, Bld: 123 — ABNORMAL HIGH
Total Protein: 6.3

## 2011-07-30 LAB — B-NATRIURETIC PEPTIDE (CONVERTED LAB): Pro B Natriuretic peptide (BNP): <30

## 2011-07-30 LAB — CBC
Hemoglobin: 12.1
Hemoglobin: 12.1
MCHC: 34.2
MCV: 83.5
MCV: 83.9
Platelets: 412 — ABNORMAL HIGH
RBC: 4.14
RBC: 4.2
RDW: 15.5
RDW: 15.6 — ABNORMAL HIGH
WBC: 17.5 — ABNORMAL HIGH

## 2011-07-30 LAB — POCT I-STAT 4, (NA,K, GLUC, HGB,HCT)
HCT: 47 — ABNORMAL HIGH
Hemoglobin: 16 — ABNORMAL HIGH
Potassium: 4
Sodium: 139

## 2011-07-30 LAB — FERRITIN: Ferritin: 53 (ref 10–291)

## 2011-08-06 LAB — DIFFERENTIAL
Basophils Absolute: 0.1 10*3/uL (ref 0.0–0.1)
Lymphocytes Relative: 30 % (ref 12–46)
Monocytes Absolute: 0.8 10*3/uL (ref 0.1–1.0)
Monocytes Relative: 8 % (ref 3–12)
Neutro Abs: 6 10*3/uL (ref 1.7–7.7)

## 2011-08-06 LAB — CBC
Hemoglobin: 13.6 g/dL (ref 12.0–15.0)
RBC: 4.92 MIL/uL (ref 3.87–5.11)
WBC: 10 10*3/uL (ref 4.0–10.5)

## 2011-08-17 LAB — CBC
HCT: 39.1
Hemoglobin: 13.1
MCHC: 33.5
RDW: 14.5 — ABNORMAL HIGH

## 2011-08-17 LAB — DIFFERENTIAL
Basophils Absolute: 0.1
Eosinophils Relative: 1
Lymphocytes Relative: 26
Monocytes Absolute: 0.9 — ABNORMAL HIGH

## 2012-01-11 ENCOUNTER — Other Ambulatory Visit (HOSPITAL_COMMUNITY): Payer: Self-pay | Admitting: Family Medicine

## 2012-01-11 DIAGNOSIS — Z139 Encounter for screening, unspecified: Secondary | ICD-10-CM

## 2012-02-19 ENCOUNTER — Ambulatory Visit (HOSPITAL_COMMUNITY)
Admission: RE | Admit: 2012-02-19 | Discharge: 2012-02-19 | Disposition: A | Discharge status: Home / Self Care | Payer: Medicare Other | Source: Ambulatory Visit | Attending: Family Medicine | Admitting: Family Medicine

## 2012-02-19 DIAGNOSIS — Z139 Encounter for screening, unspecified: Secondary | ICD-10-CM

## 2012-02-19 DIAGNOSIS — N63 Unspecified lump in unspecified breast: Secondary | ICD-10-CM | POA: Insufficient documentation

## 2012-02-19 DIAGNOSIS — Z1231 Encounter for screening mammogram for malignant neoplasm of breast: Secondary | ICD-10-CM | POA: Insufficient documentation

## 2012-02-25 ENCOUNTER — Other Ambulatory Visit: Payer: Self-pay | Admitting: Family Medicine

## 2012-02-25 DIAGNOSIS — R928 Other abnormal and inconclusive findings on diagnostic imaging of breast: Secondary | ICD-10-CM

## 2012-03-09 ENCOUNTER — Ambulatory Visit (HOSPITAL_COMMUNITY)
Admission: RE | Admit: 2012-03-09 | Discharge: 2012-03-09 | Disposition: A | Discharge status: Home / Self Care | Payer: Medicare Other | Source: Ambulatory Visit | Attending: Family Medicine | Admitting: Family Medicine

## 2012-03-09 DIAGNOSIS — R928 Other abnormal and inconclusive findings on diagnostic imaging of breast: Secondary | ICD-10-CM | POA: Insufficient documentation

## 2013-01-25 ENCOUNTER — Other Ambulatory Visit (HOSPITAL_COMMUNITY): Payer: Self-pay | Admitting: Family Medicine

## 2013-01-25 DIAGNOSIS — Z139 Encounter for screening, unspecified: Secondary | ICD-10-CM

## 2013-02-23 ENCOUNTER — Ambulatory Visit (HOSPITAL_COMMUNITY)
Admission: RE | Admit: 2013-02-23 | Discharge: 2013-02-23 | Disposition: A | Discharge status: Home / Self Care | Payer: Medicare Other | Source: Ambulatory Visit | Attending: Family Medicine | Admitting: Family Medicine

## 2013-02-23 DIAGNOSIS — Z139 Encounter for screening, unspecified: Secondary | ICD-10-CM

## 2013-02-23 DIAGNOSIS — Z1231 Encounter for screening mammogram for malignant neoplasm of breast: Secondary | ICD-10-CM | POA: Insufficient documentation

## 2014-02-16 ENCOUNTER — Other Ambulatory Visit (HOSPITAL_COMMUNITY): Payer: Self-pay | Admitting: Family Medicine

## 2014-02-16 DIAGNOSIS — Z1231 Encounter for screening mammogram for malignant neoplasm of breast: Secondary | ICD-10-CM

## 2014-02-26 ENCOUNTER — Ambulatory Visit (HOSPITAL_COMMUNITY): Payer: Medicare Other

## 2014-02-27 ENCOUNTER — Other Ambulatory Visit (HOSPITAL_COMMUNITY): Payer: Self-pay | Admitting: Family Medicine

## 2014-02-27 ENCOUNTER — Ambulatory Visit (HOSPITAL_COMMUNITY)
Admission: RE | Admit: 2014-02-27 | Discharge: 2014-02-27 | Disposition: A | Discharge status: Home / Self Care | Payer: Medicare Other | Source: Ambulatory Visit | Attending: Family Medicine | Admitting: Family Medicine

## 2014-02-27 ENCOUNTER — Encounter (INDEPENDENT_AMBULATORY_CARE_PROVIDER_SITE_OTHER): Payer: Self-pay

## 2014-02-27 DIAGNOSIS — R05 Cough: Secondary | ICD-10-CM | POA: Insufficient documentation

## 2014-02-27 DIAGNOSIS — J449 Chronic obstructive pulmonary disease, unspecified: Secondary | ICD-10-CM

## 2014-02-27 DIAGNOSIS — J209 Acute bronchitis, unspecified: Secondary | ICD-10-CM

## 2014-02-27 DIAGNOSIS — R059 Cough, unspecified: Secondary | ICD-10-CM

## 2014-02-27 DIAGNOSIS — J438 Other emphysema: Secondary | ICD-10-CM | POA: Insufficient documentation

## 2014-02-27 DIAGNOSIS — J4489 Other specified chronic obstructive pulmonary disease: Secondary | ICD-10-CM

## 2014-05-23 ENCOUNTER — Other Ambulatory Visit (INDEPENDENT_AMBULATORY_CARE_PROVIDER_SITE_OTHER): Payer: Self-pay | Admitting: Otolaryngology

## 2014-05-23 DIAGNOSIS — R221 Localized swelling, mass and lump, neck: Secondary | ICD-10-CM

## 2014-05-28 ENCOUNTER — Ambulatory Visit (HOSPITAL_COMMUNITY)
Admission: RE | Admit: 2014-05-28 | Discharge: 2014-05-28 | Disposition: A | Discharge status: Home / Self Care | Payer: Medicare Other | Source: Ambulatory Visit | Attending: Otolaryngology | Admitting: Otolaryngology

## 2014-05-28 DIAGNOSIS — R221 Localized swelling, mass and lump, neck: Secondary | ICD-10-CM

## 2014-05-30 MED ORDER — DIPHENHYDRAMINE HCL 50 MG/ML IJ SOLN: 50.0000 mg | Freq: Once | INTRAMUSCULAR | Status: DC

## 2014-05-30 MED ORDER — HYDROCORTISONE NA SUCCINATE PF 250 MG IJ SOLR
200.0000 mg | Freq: Once | INTRAMUSCULAR | Status: AC
  Administered 2014-06-01: 200 mg via INTRAVENOUS
  Filled 2014-05-30: qty 200

## 2014-06-01 ENCOUNTER — Ambulatory Visit (HOSPITAL_COMMUNITY)
Admission: RE | Admit: 2014-06-01 | Discharge: 2014-06-01 | Disposition: A | Discharge status: Home / Self Care | Payer: Medicare Other | Source: Ambulatory Visit | Attending: Otolaryngology | Admitting: Otolaryngology

## 2014-06-01 ENCOUNTER — Encounter (HOSPITAL_COMMUNITY): Payer: Self-pay

## 2014-06-01 DIAGNOSIS — R220 Localized swelling, mass and lump, head: Secondary | ICD-10-CM | POA: Insufficient documentation

## 2014-06-01 DIAGNOSIS — M47812 Spondylosis without myelopathy or radiculopathy, cervical region: Secondary | ICD-10-CM | POA: Diagnosis not present

## 2014-06-01 DIAGNOSIS — R221 Localized swelling, mass and lump, neck: Secondary | ICD-10-CM

## 2014-06-01 LAB — POCT I-STAT CREATININE: Creatinine, Ser: 0.8 mg/dL (ref 0.50–1.10)

## 2014-06-01 MED ORDER — IOHEXOL 300 MG/ML  SOLN
75.0000 mL | Freq: Once | INTRAMUSCULAR | Status: AC | PRN
  Administered 2014-06-01: 75 mL via INTRAVENOUS

## 2014-06-14 ENCOUNTER — Ambulatory Visit (INDEPENDENT_AMBULATORY_CARE_PROVIDER_SITE_OTHER): Payer: Medicare Other | Admitting: Otolaryngology

## 2014-06-14 ENCOUNTER — Other Ambulatory Visit (INDEPENDENT_AMBULATORY_CARE_PROVIDER_SITE_OTHER): Payer: Self-pay | Admitting: Otolaryngology

## 2014-06-14 DIAGNOSIS — R1312 Dysphagia, oropharyngeal phase: Secondary | ICD-10-CM

## 2014-06-14 DIAGNOSIS — K219 Gastro-esophageal reflux disease without esophagitis: Secondary | ICD-10-CM

## 2014-06-14 DIAGNOSIS — R1310 Dysphagia, unspecified: Secondary | ICD-10-CM

## 2014-06-19 ENCOUNTER — Ambulatory Visit (HOSPITAL_COMMUNITY)
Admission: RE | Admit: 2014-06-19 | Discharge: 2014-06-19 | Disposition: A | Discharge status: Home / Self Care | Payer: Medicare Other | Source: Ambulatory Visit | Attending: Otolaryngology | Admitting: Otolaryngology

## 2014-06-19 DIAGNOSIS — K449 Diaphragmatic hernia without obstruction or gangrene: Secondary | ICD-10-CM | POA: Diagnosis not present

## 2014-06-19 DIAGNOSIS — R1310 Dysphagia, unspecified: Secondary | ICD-10-CM | POA: Diagnosis present

## 2014-06-19 DIAGNOSIS — K224 Dyskinesia of esophagus: Secondary | ICD-10-CM | POA: Insufficient documentation

## 2014-07-26 ENCOUNTER — Ambulatory Visit (INDEPENDENT_AMBULATORY_CARE_PROVIDER_SITE_OTHER): Payer: Medicare Other | Admitting: Otolaryngology

## 2014-07-26 DIAGNOSIS — K219 Gastro-esophageal reflux disease without esophagitis: Secondary | ICD-10-CM

## 2014-07-26 DIAGNOSIS — R490 Dysphonia: Secondary | ICD-10-CM

## 2014-10-18 ENCOUNTER — Ambulatory Visit (INDEPENDENT_AMBULATORY_CARE_PROVIDER_SITE_OTHER): Payer: Medicare Other | Admitting: Otolaryngology

## 2014-10-18 DIAGNOSIS — R490 Dysphonia: Secondary | ICD-10-CM

## 2014-10-18 DIAGNOSIS — R1312 Dysphagia, oropharyngeal phase: Secondary | ICD-10-CM

## 2015-02-28 ENCOUNTER — Ambulatory Visit (INDEPENDENT_AMBULATORY_CARE_PROVIDER_SITE_OTHER): Payer: Medicare Other | Admitting: Otolaryngology

## 2015-02-28 DIAGNOSIS — R490 Dysphonia: Secondary | ICD-10-CM

## 2015-02-28 DIAGNOSIS — R1312 Dysphagia, oropharyngeal phase: Secondary | ICD-10-CM

## 2015-03-08 DIAGNOSIS — H3531 Nonexudative age-related macular degeneration: Secondary | ICD-10-CM | POA: Diagnosis not present

## 2015-05-14 DIAGNOSIS — H4011X3 Primary open-angle glaucoma, severe stage: Secondary | ICD-10-CM | POA: Diagnosis not present

## 2015-05-16 DIAGNOSIS — H4011X3 Primary open-angle glaucoma, severe stage: Secondary | ICD-10-CM | POA: Diagnosis not present

## 2015-07-23 ENCOUNTER — Other Ambulatory Visit (HOSPITAL_COMMUNITY): Payer: Self-pay | Admitting: Family Medicine

## 2015-07-23 DIAGNOSIS — Z713 Dietary counseling and surveillance: Secondary | ICD-10-CM | POA: Diagnosis not present

## 2015-07-23 DIAGNOSIS — Z0000 Encounter for general adult medical examination without abnormal findings: Secondary | ICD-10-CM | POA: Diagnosis not present

## 2015-07-23 DIAGNOSIS — Z681 Body mass index (BMI) 19 or less, adult: Secondary | ICD-10-CM | POA: Diagnosis not present

## 2015-07-23 DIAGNOSIS — Z23 Encounter for immunization: Secondary | ICD-10-CM | POA: Diagnosis not present

## 2015-07-23 DIAGNOSIS — Z1231 Encounter for screening mammogram for malignant neoplasm of breast: Secondary | ICD-10-CM

## 2015-07-23 DIAGNOSIS — J449 Chronic obstructive pulmonary disease, unspecified: Secondary | ICD-10-CM | POA: Diagnosis not present

## 2015-07-23 DIAGNOSIS — R739 Hyperglycemia, unspecified: Secondary | ICD-10-CM | POA: Diagnosis not present

## 2015-07-23 DIAGNOSIS — E782 Mixed hyperlipidemia: Secondary | ICD-10-CM | POA: Diagnosis not present

## 2015-07-23 DIAGNOSIS — Z1389 Encounter for screening for other disorder: Secondary | ICD-10-CM | POA: Diagnosis not present

## 2015-07-23 DIAGNOSIS — I10 Essential (primary) hypertension: Secondary | ICD-10-CM | POA: Diagnosis not present

## 2015-07-29 ENCOUNTER — Ambulatory Visit (HOSPITAL_COMMUNITY)
Admission: RE | Admit: 2015-07-29 | Discharge: 2015-07-29 | Disposition: A | Discharge status: Home / Self Care | Payer: Medicare Other | Source: Ambulatory Visit | Attending: Family Medicine | Admitting: Family Medicine

## 2015-07-29 ENCOUNTER — Other Ambulatory Visit (HOSPITAL_COMMUNITY): Payer: Self-pay | Admitting: Family Medicine

## 2015-07-29 DIAGNOSIS — Z1231 Encounter for screening mammogram for malignant neoplasm of breast: Secondary | ICD-10-CM

## 2015-07-29 DIAGNOSIS — Z0000 Encounter for general adult medical examination without abnormal findings: Secondary | ICD-10-CM

## 2015-07-29 DIAGNOSIS — Z1239 Encounter for other screening for malignant neoplasm of breast: Secondary | ICD-10-CM

## 2015-08-29 ENCOUNTER — Ambulatory Visit (INDEPENDENT_AMBULATORY_CARE_PROVIDER_SITE_OTHER): Payer: Medicare Other | Admitting: Otolaryngology

## 2015-08-29 DIAGNOSIS — R490 Dysphonia: Secondary | ICD-10-CM

## 2015-08-29 DIAGNOSIS — R1312 Dysphagia, oropharyngeal phase: Secondary | ICD-10-CM | POA: Diagnosis not present

## 2015-09-19 DIAGNOSIS — H353130 Nonexudative age-related macular degeneration, bilateral, stage unspecified: Secondary | ICD-10-CM | POA: Diagnosis not present

## 2015-11-18 DIAGNOSIS — H401133 Primary open-angle glaucoma, bilateral, severe stage: Secondary | ICD-10-CM | POA: Diagnosis not present

## 2015-12-09 ENCOUNTER — Other Ambulatory Visit
Admission: RE | Admit: 2015-12-09 | Discharge: 2015-12-09 | Disposition: A | Discharge status: Home / Self Care | Payer: Medicare Other | Source: Ambulatory Visit | Attending: Ophthalmology | Admitting: Ophthalmology

## 2015-12-09 DIAGNOSIS — H16002 Unspecified corneal ulcer, left eye: Secondary | ICD-10-CM | POA: Insufficient documentation

## 2015-12-10 DIAGNOSIS — H16002 Unspecified corneal ulcer, left eye: Secondary | ICD-10-CM | POA: Diagnosis not present

## 2015-12-11 DIAGNOSIS — H16002 Unspecified corneal ulcer, left eye: Secondary | ICD-10-CM | POA: Diagnosis not present

## 2015-12-12 DIAGNOSIS — H16002 Unspecified corneal ulcer, left eye: Secondary | ICD-10-CM | POA: Diagnosis not present

## 2015-12-13 DIAGNOSIS — H16002 Unspecified corneal ulcer, left eye: Secondary | ICD-10-CM | POA: Diagnosis not present

## 2015-12-13 LAB — WOUND CULTURE

## 2015-12-16 DIAGNOSIS — H16002 Unspecified corneal ulcer, left eye: Secondary | ICD-10-CM | POA: Diagnosis not present

## 2015-12-19 DIAGNOSIS — H16002 Unspecified corneal ulcer, left eye: Secondary | ICD-10-CM | POA: Diagnosis not present

## 2015-12-27 DIAGNOSIS — H16002 Unspecified corneal ulcer, left eye: Secondary | ICD-10-CM | POA: Diagnosis not present

## 2016-01-03 DIAGNOSIS — H169 Unspecified keratitis: Secondary | ICD-10-CM | POA: Diagnosis not present

## 2016-01-03 DIAGNOSIS — H02053 Trichiasis without entropian right eye, unspecified eyelid: Secondary | ICD-10-CM | POA: Diagnosis not present

## 2016-01-17 DIAGNOSIS — H16002 Unspecified corneal ulcer, left eye: Secondary | ICD-10-CM | POA: Diagnosis not present

## 2016-01-22 DIAGNOSIS — M546 Pain in thoracic spine: Secondary | ICD-10-CM | POA: Diagnosis not present

## 2016-01-22 DIAGNOSIS — Z1389 Encounter for screening for other disorder: Secondary | ICD-10-CM | POA: Diagnosis not present

## 2016-01-22 DIAGNOSIS — M545 Low back pain: Secondary | ICD-10-CM | POA: Diagnosis not present

## 2016-01-22 DIAGNOSIS — L989 Disorder of the skin and subcutaneous tissue, unspecified: Secondary | ICD-10-CM | POA: Diagnosis not present

## 2016-01-22 DIAGNOSIS — L8990 Pressure ulcer of unspecified site, unspecified stage: Secondary | ICD-10-CM | POA: Diagnosis not present

## 2016-02-06 DIAGNOSIS — Z85828 Personal history of other malignant neoplasm of skin: Secondary | ICD-10-CM | POA: Diagnosis not present

## 2016-02-06 DIAGNOSIS — L820 Inflamed seborrheic keratosis: Secondary | ICD-10-CM | POA: Diagnosis not present

## 2016-02-06 DIAGNOSIS — Z08 Encounter for follow-up examination after completed treatment for malignant neoplasm: Secondary | ICD-10-CM | POA: Diagnosis not present

## 2016-03-03 DIAGNOSIS — H401133 Primary open-angle glaucoma, bilateral, severe stage: Secondary | ICD-10-CM | POA: Diagnosis not present

## 2016-05-18 DIAGNOSIS — H401133 Primary open-angle glaucoma, bilateral, severe stage: Secondary | ICD-10-CM | POA: Diagnosis not present

## 2016-05-25 DIAGNOSIS — H353133 Nonexudative age-related macular degeneration, bilateral, advanced atrophic without subfoveal involvement: Secondary | ICD-10-CM | POA: Diagnosis not present

## 2016-09-07 DIAGNOSIS — Z1389 Encounter for screening for other disorder: Secondary | ICD-10-CM | POA: Diagnosis not present

## 2016-09-07 DIAGNOSIS — R7309 Other abnormal glucose: Secondary | ICD-10-CM | POA: Diagnosis not present

## 2016-09-07 DIAGNOSIS — D509 Iron deficiency anemia, unspecified: Secondary | ICD-10-CM | POA: Diagnosis not present

## 2016-09-07 DIAGNOSIS — I2510 Atherosclerotic heart disease of native coronary artery without angina pectoris: Secondary | ICD-10-CM | POA: Diagnosis not present

## 2016-09-07 DIAGNOSIS — Z23 Encounter for immunization: Secondary | ICD-10-CM | POA: Diagnosis not present

## 2016-09-07 DIAGNOSIS — I10 Essential (primary) hypertension: Secondary | ICD-10-CM | POA: Diagnosis not present

## 2016-09-07 DIAGNOSIS — I509 Heart failure, unspecified: Secondary | ICD-10-CM | POA: Diagnosis not present

## 2016-09-07 DIAGNOSIS — Z0000 Encounter for general adult medical examination without abnormal findings: Secondary | ICD-10-CM | POA: Diagnosis not present

## 2016-09-21 DIAGNOSIS — H353133 Nonexudative age-related macular degeneration, bilateral, advanced atrophic without subfoveal involvement: Secondary | ICD-10-CM | POA: Diagnosis not present

## 2016-09-29 DIAGNOSIS — S0501XD Injury of conjunctiva and corneal abrasion without foreign body, right eye, subsequent encounter: Secondary | ICD-10-CM | POA: Diagnosis not present

## 2017-01-18 DIAGNOSIS — R350 Frequency of micturition: Secondary | ICD-10-CM | POA: Diagnosis not present

## 2017-01-18 DIAGNOSIS — N390 Urinary tract infection, site not specified: Secondary | ICD-10-CM | POA: Diagnosis not present

## 2017-03-08 DIAGNOSIS — H18831 Recurrent erosion of cornea, right eye: Secondary | ICD-10-CM | POA: Diagnosis not present

## 2017-03-16 DIAGNOSIS — H2000 Unspecified acute and subacute iridocyclitis: Secondary | ICD-10-CM | POA: Diagnosis not present

## 2017-04-30 DIAGNOSIS — H353133 Nonexudative age-related macular degeneration, bilateral, advanced atrophic without subfoveal involvement: Secondary | ICD-10-CM | POA: Diagnosis not present

## 2017-08-03 DIAGNOSIS — H18893 Other specified disorders of cornea, bilateral: Secondary | ICD-10-CM | POA: Diagnosis not present

## 2017-08-06 DIAGNOSIS — H18831 Recurrent erosion of cornea, right eye: Secondary | ICD-10-CM | POA: Diagnosis not present

## 2017-08-16 DIAGNOSIS — H18893 Other specified disorders of cornea, bilateral: Secondary | ICD-10-CM | POA: Diagnosis not present

## 2017-08-23 DIAGNOSIS — H18831 Recurrent erosion of cornea, right eye: Secondary | ICD-10-CM | POA: Diagnosis not present

## 2017-08-25 DIAGNOSIS — I10 Essential (primary) hypertension: Secondary | ICD-10-CM | POA: Diagnosis not present

## 2017-08-25 DIAGNOSIS — Z23 Encounter for immunization: Secondary | ICD-10-CM | POA: Diagnosis not present

## 2017-08-25 DIAGNOSIS — Z0000 Encounter for general adult medical examination without abnormal findings: Secondary | ICD-10-CM | POA: Diagnosis not present

## 2017-08-31 DIAGNOSIS — H18831 Recurrent erosion of cornea, right eye: Secondary | ICD-10-CM | POA: Diagnosis not present

## 2017-09-14 DIAGNOSIS — H18893 Other specified disorders of cornea, bilateral: Secondary | ICD-10-CM | POA: Diagnosis not present

## 2017-11-30 DIAGNOSIS — J209 Acute bronchitis, unspecified: Secondary | ICD-10-CM | POA: Diagnosis not present

## 2017-11-30 DIAGNOSIS — Z23 Encounter for immunization: Secondary | ICD-10-CM | POA: Diagnosis not present

## 2017-11-30 DIAGNOSIS — J22 Unspecified acute lower respiratory infection: Secondary | ICD-10-CM | POA: Diagnosis not present

## 2017-11-30 DIAGNOSIS — Z0000 Encounter for general adult medical examination without abnormal findings: Secondary | ICD-10-CM | POA: Diagnosis not present

## 2017-11-30 DIAGNOSIS — Z1389 Encounter for screening for other disorder: Secondary | ICD-10-CM | POA: Diagnosis not present

## 2017-12-16 DIAGNOSIS — H353133 Nonexudative age-related macular degeneration, bilateral, advanced atrophic without subfoveal involvement: Secondary | ICD-10-CM | POA: Diagnosis not present

## 2018-03-01 DIAGNOSIS — R600 Localized edema: Secondary | ICD-10-CM | POA: Diagnosis not present

## 2018-03-01 DIAGNOSIS — Z1389 Encounter for screening for other disorder: Secondary | ICD-10-CM | POA: Diagnosis not present

## 2018-03-01 DIAGNOSIS — I10 Essential (primary) hypertension: Secondary | ICD-10-CM | POA: Diagnosis not present

## 2018-04-15 DIAGNOSIS — H401133 Primary open-angle glaucoma, bilateral, severe stage: Secondary | ICD-10-CM | POA: Diagnosis not present

## 2018-08-16 DIAGNOSIS — H353132 Nonexudative age-related macular degeneration, bilateral, intermediate dry stage: Secondary | ICD-10-CM | POA: Diagnosis not present

## 2018-09-09 DIAGNOSIS — H18893 Other specified disorders of cornea, bilateral: Secondary | ICD-10-CM | POA: Diagnosis not present

## 2018-09-19 DIAGNOSIS — I10 Essential (primary) hypertension: Secondary | ICD-10-CM | POA: Diagnosis not present

## 2018-09-19 DIAGNOSIS — Z0000 Encounter for general adult medical examination without abnormal findings: Secondary | ICD-10-CM | POA: Diagnosis not present

## 2018-09-19 DIAGNOSIS — Z23 Encounter for immunization: Secondary | ICD-10-CM | POA: Diagnosis not present

## 2018-09-19 DIAGNOSIS — Z1389 Encounter for screening for other disorder: Secondary | ICD-10-CM | POA: Diagnosis not present

## 2018-12-08 DIAGNOSIS — H169 Unspecified keratitis: Secondary | ICD-10-CM | POA: Diagnosis not present

## 2018-12-26 DIAGNOSIS — H18893 Other specified disorders of cornea, bilateral: Secondary | ICD-10-CM | POA: Diagnosis not present

## 2019-03-30 DIAGNOSIS — H353134 Nonexudative age-related macular degeneration, bilateral, advanced atrophic with subfoveal involvement: Secondary | ICD-10-CM | POA: Diagnosis not present

## 2019-04-04 DIAGNOSIS — M150 Primary generalized (osteo)arthritis: Secondary | ICD-10-CM | POA: Diagnosis not present

## 2019-04-04 DIAGNOSIS — Z1389 Encounter for screening for other disorder: Secondary | ICD-10-CM | POA: Diagnosis not present

## 2019-04-04 DIAGNOSIS — I10 Essential (primary) hypertension: Secondary | ICD-10-CM | POA: Diagnosis not present

## 2019-04-04 DIAGNOSIS — E441 Mild protein-calorie malnutrition: Secondary | ICD-10-CM | POA: Diagnosis not present

## 2019-04-04 DIAGNOSIS — Z0001 Encounter for general adult medical examination with abnormal findings: Secondary | ICD-10-CM | POA: Diagnosis not present

## 2019-08-01 DIAGNOSIS — R609 Edema, unspecified: Secondary | ICD-10-CM | POA: Diagnosis not present

## 2019-08-01 DIAGNOSIS — Z23 Encounter for immunization: Secondary | ICD-10-CM | POA: Diagnosis not present

## 2019-08-04 DIAGNOSIS — M150 Primary generalized (osteo)arthritis: Secondary | ICD-10-CM | POA: Diagnosis not present

## 2019-08-04 DIAGNOSIS — E441 Mild protein-calorie malnutrition: Secondary | ICD-10-CM | POA: Diagnosis not present

## 2019-09-01 DIAGNOSIS — I110 Hypertensive heart disease with heart failure: Secondary | ICD-10-CM | POA: Diagnosis not present

## 2019-09-01 DIAGNOSIS — Z720 Tobacco use: Secondary | ICD-10-CM | POA: Diagnosis not present

## 2019-09-01 DIAGNOSIS — J449 Chronic obstructive pulmonary disease, unspecified: Secondary | ICD-10-CM | POA: Diagnosis not present

## 2019-09-01 DIAGNOSIS — I509 Heart failure, unspecified: Secondary | ICD-10-CM | POA: Diagnosis not present

## 2019-09-04 DIAGNOSIS — E441 Mild protein-calorie malnutrition: Secondary | ICD-10-CM | POA: Diagnosis not present

## 2019-09-04 DIAGNOSIS — M150 Primary generalized (osteo)arthritis: Secondary | ICD-10-CM | POA: Diagnosis not present

## 2019-10-04 DIAGNOSIS — M150 Primary generalized (osteo)arthritis: Secondary | ICD-10-CM | POA: Diagnosis not present

## 2019-10-04 DIAGNOSIS — E441 Mild protein-calorie malnutrition: Secondary | ICD-10-CM | POA: Diagnosis not present

## 2019-11-04 DIAGNOSIS — E441 Mild protein-calorie malnutrition: Secondary | ICD-10-CM | POA: Diagnosis not present

## 2019-11-04 DIAGNOSIS — M150 Primary generalized (osteo)arthritis: Secondary | ICD-10-CM | POA: Diagnosis not present

## 2019-11-07 DIAGNOSIS — Z79899 Other long term (current) drug therapy: Secondary | ICD-10-CM | POA: Diagnosis not present

## 2019-11-07 DIAGNOSIS — R300 Dysuria: Secondary | ICD-10-CM | POA: Diagnosis not present

## 2019-11-07 DIAGNOSIS — I10 Essential (primary) hypertension: Secondary | ICD-10-CM | POA: Diagnosis not present

## 2019-11-07 DIAGNOSIS — R81 Glycosuria: Secondary | ICD-10-CM | POA: Diagnosis not present

## 2019-11-07 DIAGNOSIS — M40209 Unspecified kyphosis, site unspecified: Secondary | ICD-10-CM | POA: Diagnosis not present

## 2019-11-07 DIAGNOSIS — E7849 Other hyperlipidemia: Secondary | ICD-10-CM | POA: Diagnosis not present

## 2019-11-16 DIAGNOSIS — H353131 Nonexudative age-related macular degeneration, bilateral, early dry stage: Secondary | ICD-10-CM | POA: Diagnosis not present

## 2019-11-16 DIAGNOSIS — H401133 Primary open-angle glaucoma, bilateral, severe stage: Secondary | ICD-10-CM | POA: Diagnosis not present

## 2019-12-05 DIAGNOSIS — E441 Mild protein-calorie malnutrition: Secondary | ICD-10-CM | POA: Diagnosis not present

## 2019-12-05 DIAGNOSIS — M150 Primary generalized (osteo)arthritis: Secondary | ICD-10-CM | POA: Diagnosis not present

## 2019-12-31 DIAGNOSIS — I110 Hypertensive heart disease with heart failure: Secondary | ICD-10-CM | POA: Diagnosis not present

## 2019-12-31 DIAGNOSIS — I509 Heart failure, unspecified: Secondary | ICD-10-CM | POA: Diagnosis not present

## 2019-12-31 DIAGNOSIS — Z720 Tobacco use: Secondary | ICD-10-CM | POA: Diagnosis not present

## 2019-12-31 DIAGNOSIS — J449 Chronic obstructive pulmonary disease, unspecified: Secondary | ICD-10-CM | POA: Diagnosis not present

## 2020-01-02 DIAGNOSIS — M150 Primary generalized (osteo)arthritis: Secondary | ICD-10-CM | POA: Diagnosis not present

## 2020-01-02 DIAGNOSIS — E441 Mild protein-calorie malnutrition: Secondary | ICD-10-CM | POA: Diagnosis not present

## 2020-01-10 DIAGNOSIS — Z1389 Encounter for screening for other disorder: Secondary | ICD-10-CM | POA: Diagnosis not present

## 2020-01-10 DIAGNOSIS — Z0001 Encounter for general adult medical examination with abnormal findings: Secondary | ICD-10-CM | POA: Diagnosis not present

## 2020-02-02 DIAGNOSIS — E441 Mild protein-calorie malnutrition: Secondary | ICD-10-CM | POA: Diagnosis not present

## 2020-02-02 DIAGNOSIS — M150 Primary generalized (osteo)arthritis: Secondary | ICD-10-CM | POA: Diagnosis not present

## 2020-02-05 ENCOUNTER — Other Ambulatory Visit
Admission: RE | Admit: 2020-02-05 | Discharge: 2020-02-05 | Disposition: A | Discharge status: Home / Self Care | Payer: Medicare Other | Source: Ambulatory Visit | Attending: Ophthalmology | Admitting: Ophthalmology

## 2020-02-05 DIAGNOSIS — H16001 Unspecified corneal ulcer, right eye: Secondary | ICD-10-CM | POA: Insufficient documentation

## 2020-02-06 DIAGNOSIS — H16001 Unspecified corneal ulcer, right eye: Secondary | ICD-10-CM | POA: Diagnosis not present

## 2020-02-07 DIAGNOSIS — H16001 Unspecified corneal ulcer, right eye: Secondary | ICD-10-CM | POA: Diagnosis not present

## 2020-02-08 LAB — EYE CULTURE

## 2020-02-09 DIAGNOSIS — T1501XA Foreign body in cornea, right eye, initial encounter: Secondary | ICD-10-CM | POA: Diagnosis not present

## 2020-02-09 DIAGNOSIS — H16001 Unspecified corneal ulcer, right eye: Secondary | ICD-10-CM | POA: Diagnosis not present

## 2020-02-12 DIAGNOSIS — H16001 Unspecified corneal ulcer, right eye: Secondary | ICD-10-CM | POA: Diagnosis not present

## 2020-02-14 DIAGNOSIS — H16001 Unspecified corneal ulcer, right eye: Secondary | ICD-10-CM | POA: Diagnosis not present

## 2020-02-16 DIAGNOSIS — H16001 Unspecified corneal ulcer, right eye: Secondary | ICD-10-CM | POA: Diagnosis not present

## 2020-02-16 DIAGNOSIS — H02053 Trichiasis without entropian right eye, unspecified eyelid: Secondary | ICD-10-CM | POA: Diagnosis not present

## 2020-02-19 DIAGNOSIS — H16001 Unspecified corneal ulcer, right eye: Secondary | ICD-10-CM | POA: Diagnosis not present

## 2020-02-21 DIAGNOSIS — H16001 Unspecified corneal ulcer, right eye: Secondary | ICD-10-CM | POA: Diagnosis not present

## 2020-02-27 DIAGNOSIS — H16001 Unspecified corneal ulcer, right eye: Secondary | ICD-10-CM | POA: Diagnosis not present

## 2020-02-28 LAB — CULTURE, FUNGUS WITHOUT SMEAR

## 2020-03-03 DIAGNOSIS — M150 Primary generalized (osteo)arthritis: Secondary | ICD-10-CM | POA: Diagnosis not present

## 2020-03-03 DIAGNOSIS — E441 Mild protein-calorie malnutrition: Secondary | ICD-10-CM | POA: Diagnosis not present

## 2020-03-05 DIAGNOSIS — H16001 Unspecified corneal ulcer, right eye: Secondary | ICD-10-CM | POA: Diagnosis not present

## 2020-03-14 DIAGNOSIS — H16001 Unspecified corneal ulcer, right eye: Secondary | ICD-10-CM | POA: Diagnosis not present

## 2020-03-20 DIAGNOSIS — H16001 Unspecified corneal ulcer, right eye: Secondary | ICD-10-CM | POA: Diagnosis not present

## 2020-03-28 DIAGNOSIS — H16001 Unspecified corneal ulcer, right eye: Secondary | ICD-10-CM | POA: Diagnosis not present

## 2020-04-03 DIAGNOSIS — E441 Mild protein-calorie malnutrition: Secondary | ICD-10-CM | POA: Diagnosis not present

## 2020-04-03 DIAGNOSIS — M150 Primary generalized (osteo)arthritis: Secondary | ICD-10-CM | POA: Diagnosis not present

## 2020-04-17 DIAGNOSIS — H16001 Unspecified corneal ulcer, right eye: Secondary | ICD-10-CM | POA: Diagnosis not present

## 2020-05-03 DIAGNOSIS — M150 Primary generalized (osteo)arthritis: Secondary | ICD-10-CM | POA: Diagnosis not present

## 2020-05-03 DIAGNOSIS — E441 Mild protein-calorie malnutrition: Secondary | ICD-10-CM | POA: Diagnosis not present

## 2020-05-17 DIAGNOSIS — H401133 Primary open-angle glaucoma, bilateral, severe stage: Secondary | ICD-10-CM | POA: Diagnosis not present

## 2020-06-10 DIAGNOSIS — J22 Unspecified acute lower respiratory infection: Secondary | ICD-10-CM | POA: Diagnosis not present

## 2020-07-02 DIAGNOSIS — I509 Heart failure, unspecified: Secondary | ICD-10-CM | POA: Diagnosis not present

## 2020-07-02 DIAGNOSIS — I110 Hypertensive heart disease with heart failure: Secondary | ICD-10-CM | POA: Diagnosis not present

## 2020-07-02 DIAGNOSIS — Z720 Tobacco use: Secondary | ICD-10-CM | POA: Diagnosis not present

## 2020-07-02 DIAGNOSIS — J449 Chronic obstructive pulmonary disease, unspecified: Secondary | ICD-10-CM | POA: Diagnosis not present

## 2020-07-16 DIAGNOSIS — M1991 Primary osteoarthritis, unspecified site: Secondary | ICD-10-CM | POA: Diagnosis not present

## 2020-07-16 DIAGNOSIS — Z681 Body mass index (BMI) 19 or less, adult: Secondary | ICD-10-CM | POA: Diagnosis not present

## 2020-07-16 DIAGNOSIS — Z23 Encounter for immunization: Secondary | ICD-10-CM | POA: Diagnosis not present

## 2020-07-16 DIAGNOSIS — I10 Essential (primary) hypertension: Secondary | ICD-10-CM | POA: Diagnosis not present

## 2020-07-16 DIAGNOSIS — J449 Chronic obstructive pulmonary disease, unspecified: Secondary | ICD-10-CM | POA: Diagnosis not present

## 2020-07-22 DIAGNOSIS — H353131 Nonexudative age-related macular degeneration, bilateral, early dry stage: Secondary | ICD-10-CM | POA: Diagnosis not present

## 2020-08-12 DIAGNOSIS — H353131 Nonexudative age-related macular degeneration, bilateral, early dry stage: Secondary | ICD-10-CM | POA: Diagnosis not present

## 2020-08-12 DIAGNOSIS — H18893 Other specified disorders of cornea, bilateral: Secondary | ICD-10-CM | POA: Diagnosis not present

## 2020-08-14 ENCOUNTER — Emergency Department (HOSPITAL_COMMUNITY): Payer: Medicare Other

## 2020-08-14 ENCOUNTER — Inpatient Hospital Stay (HOSPITAL_COMMUNITY)
Admission: EM | Admit: 2020-08-14 | Discharge: 2020-08-20 | DRG: 177 | Disposition: A | Discharge status: Home / Self Care | Payer: Medicare Other | Attending: Internal Medicine | Admitting: Internal Medicine

## 2020-08-14 ENCOUNTER — Other Ambulatory Visit: Payer: Self-pay

## 2020-08-14 DIAGNOSIS — Z743 Need for continuous supervision: Secondary | ICD-10-CM | POA: Diagnosis not present

## 2020-08-14 DIAGNOSIS — Z880 Allergy status to penicillin: Secondary | ICD-10-CM

## 2020-08-14 DIAGNOSIS — U071 COVID-19: Secondary | ICD-10-CM | POA: Diagnosis present

## 2020-08-14 DIAGNOSIS — F17210 Nicotine dependence, cigarettes, uncomplicated: Secondary | ICD-10-CM | POA: Diagnosis not present

## 2020-08-14 DIAGNOSIS — L8995 Pressure ulcer of unspecified site, unstageable: Secondary | ICD-10-CM | POA: Diagnosis not present

## 2020-08-14 DIAGNOSIS — R069 Unspecified abnormalities of breathing: Secondary | ICD-10-CM | POA: Diagnosis not present

## 2020-08-14 DIAGNOSIS — Z681 Body mass index (BMI) 19 or less, adult: Secondary | ICD-10-CM

## 2020-08-14 DIAGNOSIS — Z66 Do not resuscitate: Secondary | ICD-10-CM | POA: Diagnosis present

## 2020-08-14 DIAGNOSIS — Z881 Allergy status to other antibiotic agents status: Secondary | ICD-10-CM | POA: Diagnosis not present

## 2020-08-14 DIAGNOSIS — R059 Cough, unspecified: Secondary | ICD-10-CM | POA: Diagnosis not present

## 2020-08-14 DIAGNOSIS — I10 Essential (primary) hypertension: Secondary | ICD-10-CM | POA: Diagnosis present

## 2020-08-14 DIAGNOSIS — B9620 Unspecified Escherichia coli [E. coli] as the cause of diseases classified elsewhere: Secondary | ICD-10-CM | POA: Diagnosis present

## 2020-08-14 DIAGNOSIS — E440 Moderate protein-calorie malnutrition: Secondary | ICD-10-CM | POA: Diagnosis present

## 2020-08-14 DIAGNOSIS — N3000 Acute cystitis without hematuria: Secondary | ICD-10-CM | POA: Diagnosis not present

## 2020-08-14 DIAGNOSIS — R0602 Shortness of breath: Secondary | ICD-10-CM | POA: Diagnosis not present

## 2020-08-14 DIAGNOSIS — L8990 Pressure ulcer of unspecified site, unspecified stage: Secondary | ICD-10-CM | POA: Diagnosis present

## 2020-08-14 DIAGNOSIS — J9601 Acute respiratory failure with hypoxia: Secondary | ICD-10-CM | POA: Diagnosis present

## 2020-08-14 DIAGNOSIS — J1282 Pneumonia due to coronavirus disease 2019: Secondary | ICD-10-CM | POA: Diagnosis not present

## 2020-08-14 DIAGNOSIS — R0902 Hypoxemia: Secondary | ICD-10-CM | POA: Diagnosis not present

## 2020-08-14 DIAGNOSIS — L89102 Pressure ulcer of unspecified part of back, stage 2: Secondary | ICD-10-CM | POA: Diagnosis not present

## 2020-08-14 DIAGNOSIS — Z87440 Personal history of urinary (tract) infections: Secondary | ICD-10-CM

## 2020-08-14 DIAGNOSIS — N390 Urinary tract infection, site not specified: Secondary | ICD-10-CM | POA: Diagnosis present

## 2020-08-14 DIAGNOSIS — Z8249 Family history of ischemic heart disease and other diseases of the circulatory system: Secondary | ICD-10-CM

## 2020-08-14 DIAGNOSIS — H9190 Unspecified hearing loss, unspecified ear: Secondary | ICD-10-CM | POA: Diagnosis present

## 2020-08-14 DIAGNOSIS — H5461 Unqualified visual loss, right eye, normal vision left eye: Secondary | ICD-10-CM | POA: Diagnosis not present

## 2020-08-14 DIAGNOSIS — Z888 Allergy status to other drugs, medicaments and biological substances status: Secondary | ICD-10-CM | POA: Diagnosis not present

## 2020-08-14 DIAGNOSIS — Z91041 Radiographic dye allergy status: Secondary | ICD-10-CM | POA: Diagnosis not present

## 2020-08-14 DIAGNOSIS — R0689 Other abnormalities of breathing: Secondary | ICD-10-CM | POA: Diagnosis not present

## 2020-08-14 DIAGNOSIS — Z882 Allergy status to sulfonamides status: Secondary | ICD-10-CM

## 2020-08-14 DIAGNOSIS — R5381 Other malaise: Secondary | ICD-10-CM | POA: Diagnosis present

## 2020-08-14 DIAGNOSIS — R0600 Dyspnea, unspecified: Secondary | ICD-10-CM | POA: Diagnosis not present

## 2020-08-14 DIAGNOSIS — L8992 Pressure ulcer of unspecified site, stage 2: Secondary | ICD-10-CM | POA: Diagnosis not present

## 2020-08-14 HISTORY — DX: Unspecified hearing loss, unspecified ear: H91.90

## 2020-08-14 HISTORY — DX: Unspecified visual loss: H54.7

## 2020-08-14 NOTE — ED Triage Notes (Signed)
Patient here for breathing problems and generalized weakness. EMS gave a duo-neb and 125 mg of solu-medrol. CBG 93. Patient has been having generalized weakness over the last few days.

## 2020-08-15 ENCOUNTER — Encounter (HOSPITAL_COMMUNITY): Payer: Self-pay | Admitting: Family Medicine

## 2020-08-15 DIAGNOSIS — R0902 Hypoxemia: Secondary | ICD-10-CM | POA: Diagnosis not present

## 2020-08-15 DIAGNOSIS — J9601 Acute respiratory failure with hypoxia: Secondary | ICD-10-CM | POA: Diagnosis present

## 2020-08-15 DIAGNOSIS — N390 Urinary tract infection, site not specified: Secondary | ICD-10-CM | POA: Diagnosis not present

## 2020-08-15 DIAGNOSIS — Z91041 Radiographic dye allergy status: Secondary | ICD-10-CM | POA: Diagnosis not present

## 2020-08-15 DIAGNOSIS — U071 COVID-19: Principal | ICD-10-CM | POA: Diagnosis present

## 2020-08-15 DIAGNOSIS — R0602 Shortness of breath: Secondary | ICD-10-CM | POA: Diagnosis not present

## 2020-08-15 DIAGNOSIS — H9190 Unspecified hearing loss, unspecified ear: Secondary | ICD-10-CM | POA: Diagnosis not present

## 2020-08-15 DIAGNOSIS — N3000 Acute cystitis without hematuria: Secondary | ICD-10-CM | POA: Insufficient documentation

## 2020-08-15 DIAGNOSIS — J1282 Pneumonia due to coronavirus disease 2019: Secondary | ICD-10-CM | POA: Diagnosis present

## 2020-08-15 DIAGNOSIS — F17210 Nicotine dependence, cigarettes, uncomplicated: Secondary | ICD-10-CM | POA: Diagnosis not present

## 2020-08-15 DIAGNOSIS — Z8249 Family history of ischemic heart disease and other diseases of the circulatory system: Secondary | ICD-10-CM | POA: Diagnosis not present

## 2020-08-15 DIAGNOSIS — Z888 Allergy status to other drugs, medicaments and biological substances status: Secondary | ICD-10-CM | POA: Diagnosis not present

## 2020-08-15 DIAGNOSIS — Z881 Allergy status to other antibiotic agents status: Secondary | ICD-10-CM | POA: Diagnosis not present

## 2020-08-15 DIAGNOSIS — R069 Unspecified abnormalities of breathing: Secondary | ICD-10-CM | POA: Diagnosis not present

## 2020-08-15 DIAGNOSIS — Z681 Body mass index (BMI) 19 or less, adult: Secondary | ICD-10-CM | POA: Diagnosis not present

## 2020-08-15 DIAGNOSIS — H5461 Unqualified visual loss, right eye, normal vision left eye: Secondary | ICD-10-CM | POA: Diagnosis present

## 2020-08-15 DIAGNOSIS — R5381 Other malaise: Secondary | ICD-10-CM | POA: Diagnosis not present

## 2020-08-15 DIAGNOSIS — Z880 Allergy status to penicillin: Secondary | ICD-10-CM | POA: Diagnosis not present

## 2020-08-15 DIAGNOSIS — E440 Moderate protein-calorie malnutrition: Secondary | ICD-10-CM | POA: Diagnosis not present

## 2020-08-15 DIAGNOSIS — L8995 Pressure ulcer of unspecified site, unstageable: Secondary | ICD-10-CM | POA: Diagnosis not present

## 2020-08-15 DIAGNOSIS — L8992 Pressure ulcer of unspecified site, stage 2: Secondary | ICD-10-CM | POA: Diagnosis not present

## 2020-08-15 DIAGNOSIS — Z87440 Personal history of urinary (tract) infections: Secondary | ICD-10-CM | POA: Diagnosis not present

## 2020-08-15 DIAGNOSIS — R059 Cough, unspecified: Secondary | ICD-10-CM | POA: Diagnosis not present

## 2020-08-15 DIAGNOSIS — R0600 Dyspnea, unspecified: Secondary | ICD-10-CM | POA: Diagnosis not present

## 2020-08-15 DIAGNOSIS — B9620 Unspecified Escherichia coli [E. coli] as the cause of diseases classified elsewhere: Secondary | ICD-10-CM | POA: Diagnosis present

## 2020-08-15 DIAGNOSIS — Z66 Do not resuscitate: Secondary | ICD-10-CM | POA: Diagnosis present

## 2020-08-15 DIAGNOSIS — L89102 Pressure ulcer of unspecified part of back, stage 2: Secondary | ICD-10-CM | POA: Diagnosis present

## 2020-08-15 DIAGNOSIS — I10 Essential (primary) hypertension: Secondary | ICD-10-CM | POA: Diagnosis not present

## 2020-08-15 DIAGNOSIS — Z882 Allergy status to sulfonamides status: Secondary | ICD-10-CM | POA: Diagnosis not present

## 2020-08-15 LAB — URINALYSIS, COMPLETE (UACMP) WITH MICROSCOPIC
Bilirubin Urine: NEGATIVE
Glucose, UA: NEGATIVE mg/dL
Ketones, ur: NEGATIVE mg/dL
Nitrite: POSITIVE — AB
Protein, ur: 100 mg/dL — AB
Specific Gravity, Urine: 1.01 (ref 1.005–1.030)
WBC, UA: >50 WBC/hpf — ABNORMAL HIGH (ref 0–5)
pH: 6 (ref 5.0–8.0)

## 2020-08-15 LAB — CBC WITH DIFFERENTIAL/PLATELET
Abs Immature Granulocytes: 0.04 10*3/uL (ref 0.00–0.07)
Basophils Absolute: 0 10*3/uL (ref 0.0–0.1)
Basophils Relative: 0 %
Eosinophils Absolute: 0 10*3/uL (ref 0.0–0.5)
Eosinophils Relative: 0 %
HCT: 38 % (ref 36.0–46.0)
Hemoglobin: 12.2 g/dL (ref 12.0–15.0)
Immature Granulocytes: 1 %
Lymphocytes Relative: 6 %
Lymphs Abs: 0.4 10*3/uL — ABNORMAL LOW (ref 0.7–4.0)
MCH: 24.8 pg — ABNORMAL LOW (ref 26.0–34.0)
MCHC: 32.1 g/dL (ref 30.0–36.0)
MCV: 77.2 fL — ABNORMAL LOW (ref 80.0–100.0)
Monocytes Absolute: 0.3 10*3/uL (ref 0.1–1.0)
Monocytes Relative: 5 %
Neutro Abs: 5.4 10*3/uL (ref 1.7–7.7)
Neutrophils Relative %: 88 %
Platelets: 388 10*3/uL (ref 150–400)
RBC: 4.92 MIL/uL (ref 3.87–5.11)
RDW: 17.4 % — ABNORMAL HIGH (ref 11.5–15.5)
WBC: 6.1 10*3/uL (ref 4.0–10.5)
nRBC: 0 % (ref 0.0–0.2)

## 2020-08-15 LAB — COMPREHENSIVE METABOLIC PANEL
ALT: 11 U/L (ref 0–44)
AST: 19 U/L (ref 15–41)
Albumin: 2.9 g/dL — ABNORMAL LOW (ref 3.5–5.0)
Alkaline Phosphatase: 77 U/L (ref 38–126)
Anion gap: 9 (ref 5–15)
BUN: 23 mg/dL (ref 8–23)
CO2: 22 mmol/L (ref 22–32)
Calcium: 7.9 mg/dL — ABNORMAL LOW (ref 8.9–10.3)
Chloride: 103 mmol/L (ref 98–111)
Creatinine, Ser: 0.63 mg/dL (ref 0.44–1.00)
GFR, Estimated: >60 mL/min (ref >60)
Glucose, Bld: 98 mg/dL (ref 70–99)
Potassium: 3.9 mmol/L (ref 3.5–5.1)
Sodium: 134 mmol/L — ABNORMAL LOW (ref 135–145)
Total Bilirubin: 0.5 mg/dL (ref 0.3–1.2)
Total Protein: 6.8 g/dL (ref 6.5–8.1)

## 2020-08-15 LAB — RESPIRATORY PANEL BY RT PCR (FLU A&B, COVID)
Influenza A by PCR: NEGATIVE
Influenza B by PCR: NEGATIVE
SARS Coronavirus 2 by RT PCR: POSITIVE — AB

## 2020-08-15 LAB — C-REACTIVE PROTEIN: CRP: 1.4 mg/dL — ABNORMAL HIGH (ref <1.0)

## 2020-08-15 LAB — D-DIMER, QUANTITATIVE: D-Dimer, Quant: 0.59 ug/mL-FEU — ABNORMAL HIGH (ref 0.00–0.50)

## 2020-08-15 LAB — FIBRINOGEN: Fibrinogen: 481 mg/dL — ABNORMAL HIGH (ref 210–475)

## 2020-08-15 LAB — PROCALCITONIN: Procalcitonin: <0.1 ng/mL

## 2020-08-15 LAB — LACTATE DEHYDROGENASE: LDH: 127 U/L (ref 98–192)

## 2020-08-15 LAB — TROPONIN I (HIGH SENSITIVITY)
Troponin I (High Sensitivity): 6 ng/L (ref <18)
Troponin I (High Sensitivity): 6 ng/L (ref <18)

## 2020-08-15 LAB — AMMONIA: Ammonia: 21 umol/L (ref 9–35)

## 2020-08-15 LAB — FERRITIN: Ferritin: 51 ng/mL (ref 11–307)

## 2020-08-15 LAB — ABO/RH: ABO/RH(D): AB NEG

## 2020-08-15 MED ORDER — ACETAMINOPHEN 325 MG PO TABS
650.0000 mg | ORAL_TABLET | Freq: Four times a day (QID) | ORAL | Status: DC | PRN
  Administered 2020-08-16: 650 mg via ORAL
  Filled 2020-08-15: qty 2

## 2020-08-15 MED ORDER — SODIUM CHLORIDE 0.9 % IV SOLN
100.0000 mg | INTRAVENOUS | Status: AC
  Administered 2020-08-15 (×2): 100 mg via INTRAVENOUS
  Filled 2020-08-15: qty 20

## 2020-08-15 MED ORDER — GUAIFENESIN-DM 100-10 MG/5ML PO SYRP: 10.0000 mL | ORAL_SOLUTION | ORAL | Status: DC | PRN

## 2020-08-15 MED ORDER — PANTOPRAZOLE SODIUM 40 MG PO TBEC
40.0000 mg | DELAYED_RELEASE_TABLET | Freq: Every day | ORAL | Status: DC
  Administered 2020-08-15 – 2020-08-19 (×5): 40 mg via ORAL
  Filled 2020-08-15 (×5): qty 1

## 2020-08-15 MED ORDER — SODIUM CHLORIDE 0.9 % IV SOLN
100.0000 mg | Freq: Every day | INTRAVENOUS | Status: AC
  Administered 2020-08-16 – 2020-08-19 (×4): 100 mg via INTRAVENOUS
  Filled 2020-08-15 (×4): qty 20

## 2020-08-15 MED ORDER — ADULT MULTIVITAMIN W/MINERALS CH
1.0000 | ORAL_TABLET | Freq: Every day | ORAL | Status: DC
  Administered 2020-08-15 – 2020-08-19 (×5): 1 via ORAL
  Filled 2020-08-15 (×5): qty 1

## 2020-08-15 MED ORDER — SODIUM CHLORIDE 0.9 % IV SOLN: 1.0000 g | INTRAVENOUS | Status: DC

## 2020-08-15 MED ORDER — ENSURE ENLIVE PO LIQD
237.0000 mL | Freq: Two times a day (BID) | ORAL | Status: DC
  Administered 2020-08-16 – 2020-08-19 (×7): 237 mL via ORAL
  Filled 2020-08-15 (×4): qty 237

## 2020-08-15 MED ORDER — SODIUM CHLORIDE 0.9 % IV SOLN: 500.0000 mg | INTRAVENOUS | Status: DC

## 2020-08-15 MED ORDER — SODIUM CHLORIDE 0.9 % IV SOLN
1.0000 g | Freq: Once | INTRAVENOUS | Status: AC
  Administered 2020-08-15: 1 g via INTRAVENOUS
  Filled 2020-08-15: qty 10

## 2020-08-15 MED ORDER — NEOMYCIN-POLYMYXIN-DEXAMETH 3.5-10000-0.1 OP SUSP
1.0000 [drp] | Freq: Two times a day (BID) | OPHTHALMIC | Status: DC
  Administered 2020-08-15 – 2020-08-19 (×10): 1 [drp] via OPHTHALMIC
  Filled 2020-08-15 (×2): qty 5

## 2020-08-15 MED ORDER — ASCORBIC ACID 500 MG PO TABS
500.0000 mg | ORAL_TABLET | Freq: Every day | ORAL | Status: DC
  Administered 2020-08-15 – 2020-08-19 (×5): 500 mg via ORAL
  Filled 2020-08-15 (×5): qty 1

## 2020-08-15 MED ORDER — ZINC SULFATE 220 (50 ZN) MG PO CAPS
220.0000 mg | ORAL_CAPSULE | Freq: Every day | ORAL | Status: DC
  Administered 2020-08-15 – 2020-08-19 (×5): 220 mg via ORAL
  Filled 2020-08-15 (×5): qty 1

## 2020-08-15 MED ORDER — ASPIRIN EC 81 MG PO TBEC
81.0000 mg | DELAYED_RELEASE_TABLET | Freq: Every day | ORAL | Status: DC
  Administered 2020-08-15 – 2020-08-19 (×5): 81 mg via ORAL
  Filled 2020-08-15 (×5): qty 1

## 2020-08-15 MED ORDER — DILTIAZEM HCL ER COATED BEADS 180 MG PO CP24
180.0000 mg | ORAL_CAPSULE | Freq: Every day | ORAL | Status: DC
  Administered 2020-08-15 – 2020-08-19 (×5): 180 mg via ORAL
  Filled 2020-08-15 (×6): qty 1

## 2020-08-15 MED ORDER — POLYETHYLENE GLYCOL 3350 17 G PO PACK: 17.0000 g | PACK | Freq: Every day | ORAL | Status: DC | PRN

## 2020-08-15 MED ORDER — ALBUTEROL SULFATE HFA 108 (90 BASE) MCG/ACT IN AERS
2.0000 | INHALATION_SPRAY | Freq: Four times a day (QID) | RESPIRATORY_TRACT | Status: DC
  Administered 2020-08-15 – 2020-08-19 (×18): 2 via RESPIRATORY_TRACT
  Filled 2020-08-15 (×3): qty 6.7

## 2020-08-15 MED ORDER — DORZOLAMIDE HCL-TIMOLOL MAL 2-0.5 % OP SOLN
1.0000 [drp] | Freq: Two times a day (BID) | OPHTHALMIC | Status: DC
  Administered 2020-08-15 – 2020-08-19 (×10): 1 [drp] via OPHTHALMIC
  Filled 2020-08-15 (×2): qty 10

## 2020-08-15 MED ORDER — DEXAMETHASONE SODIUM PHOSPHATE 10 MG/ML IJ SOLN
6.0000 mg | INTRAMUSCULAR | Status: DC
  Administered 2020-08-16 – 2020-08-18 (×4): 6 mg via INTRAVENOUS
  Filled 2020-08-15 (×5): qty 1

## 2020-08-15 MED ORDER — SODIUM CHLORIDE 0.9 % IV SOLN
500.0000 mg | Freq: Once | INTRAVENOUS | Status: AC
  Administered 2020-08-15: 500 mg via INTRAVENOUS
  Filled 2020-08-15: qty 500

## 2020-08-15 MED ORDER — FOSFOMYCIN TROMETHAMINE 3 G PO PACK
3.0000 g | PACK | Freq: Once | ORAL | Status: AC
  Administered 2020-08-15: 3 g via ORAL
  Filled 2020-08-15: qty 3

## 2020-08-15 MED ORDER — ONDANSETRON HCL 4 MG/2ML IJ SOLN: 4.0000 mg | Freq: Four times a day (QID) | INTRAMUSCULAR | Status: DC | PRN

## 2020-08-15 MED ORDER — BRIMONIDINE TARTRATE 0.15 % OP SOLN
1.0000 [drp] | Freq: Two times a day (BID) | OPHTHALMIC | Status: DC
  Administered 2020-08-15 – 2020-08-19 (×10): 1 [drp] via OPHTHALMIC
  Filled 2020-08-15 (×2): qty 5

## 2020-08-15 MED ORDER — SODIUM CHLORIDE (HYPERTONIC) 5 % OP SOLN: 1.0000 [drp] | OPHTHALMIC | Status: DC | PRN

## 2020-08-15 MED ORDER — DEXAMETHASONE SODIUM PHOSPHATE 10 MG/ML IJ SOLN
10.0000 mg | Freq: Once | INTRAMUSCULAR | Status: AC
  Administered 2020-08-15: 10 mg via INTRAVENOUS
  Filled 2020-08-15: qty 1

## 2020-08-15 MED ORDER — CALCIUM CARBONATE-VITAMIN D 500-200 MG-UNIT PO TABS
1.0000 | ORAL_TABLET | Freq: Every day | ORAL | Status: DC
  Administered 2020-08-15 – 2020-08-19 (×5): 1 via ORAL
  Filled 2020-08-15 (×6): qty 1

## 2020-08-15 MED ORDER — HEPARIN SODIUM (PORCINE) 5000 UNIT/ML IJ SOLN
5000.0000 [IU] | Freq: Three times a day (TID) | INTRAMUSCULAR | Status: DC
  Administered 2020-08-15 – 2020-08-16 (×6): 5000 [IU] via SUBCUTANEOUS
  Filled 2020-08-15 (×9): qty 1

## 2020-08-15 MED ORDER — NICOTINE 14 MG/24HR TD PT24: 14.0000 mg | MEDICATED_PATCH | Freq: Every day | TRANSDERMAL | Status: DC | PRN

## 2020-08-15 MED ORDER — HYDROCOD POLST-CPM POLST ER 10-8 MG/5ML PO SUER: 5.0000 mL | Freq: Two times a day (BID) | ORAL | Status: DC | PRN

## 2020-08-15 MED ORDER — ONDANSETRON HCL 4 MG PO TABS: 4.0000 mg | ORAL_TABLET | Freq: Four times a day (QID) | ORAL | Status: DC | PRN

## 2020-08-15 NOTE — ED Provider Notes (Signed)
[facility] EMERGENCY DEPARTMENT Provider Note   CSN: [id number] Arrival date & time: 08/14/20  2233     History Chief Complaint  Patient presents with  . Cough    congestion, weakness    [name] is a 86 y.o. female.  Patient is here for some altered mental status and confusion.  Not able to obtain a good history from her is obtained from her daughter, Joyce Gross, who is her power of attorney and is mother brought her in today.  The daughter states that the patient is normally clear of mind and takes care of her self.  Over the last few days she is on multiple symptoms.  Includes weakness, change in her sleep schedule, cough, confusion.  No fevers.  She has a history of urinary tract infections but does not know of her have any urinary symptoms recently.  She also has a history of having pneumonia and therefore this might be was going on.  She is exposed to family but no one else.  No known Covid contacts.  Patient is vaccinated and finished those in March.  Her daughter K states that she has a living will stating DNR status and K is her power of attorney and confirms this.  Have not seen by her tried thing for the symptoms yet brought here because of the confusion and sleepiness. Of note patient has a known significant decrease in her vision.  Also is hard of hearing.   Cough      Past Medical History:  Diagnosis Date  . Hypertension     Patient Active Problem List   Diagnosis Date Noted  . Pneumonia due to COVID-19 virus 08/15/2020    No past surgical history on file.   OB History   No obstetric history on file.     No family history on file.  Social History   Tobacco Use  . Smoking status: Not on file  Substance Use Topics  . Alcohol use: Not on file  . Drug use: Not on file    Home Medications Prior to Admission medications   Not on File    Allergies    Iohexol, Nitrofurantoin, Penicillins, Prednisone, and Sulfa antibiotics  Review of Systems     Review of Systems  Respiratory: Positive for cough.   All other systems reviewed and are negative.   Physical Exam Updated Vital Signs BP (!) 153/89   Pulse 97   Temp 99.1 F (37.3 C) (Rectal)   Resp 19   Ht 5\' 5"  (1.651 m)   Wt 39.9 kg   SpO2 100%   BMI 14.64 kg/m   Physical Exam Vitals and nursing note reviewed.  Constitutional:      Appearance: She is well-developed.  HENT:     Head: Normocephalic and atraumatic.     Nose: No congestion or rhinorrhea.     Mouth/Throat:     Mouth: Mucous membranes are moist.     Pharynx: Oropharynx is clear.  Cardiovascular:     Rate and Rhythm: Normal rate and regular rhythm.  Pulmonary:     Effort: No respiratory distress.     Breath sounds: No stridor.     Comments: Hypoxia and tachypnea Abdominal:     General: There is no distension.  Musculoskeletal:        General: No swelling or tenderness. Normal range of motion.     Cervical back: Normal range of motion.  Skin:    General: Skin is warm and dry.  Neurological:     General: No focal deficit present.     Mental Status: She is alert.     ED Results / Procedures / Treatments   Labs (all labs ordered are listed, but only abnormal results are displayed) Labs Reviewed  RESPIRATORY PANEL BY RT PCR (FLU A&B, COVID) - Abnormal; Notable for the following components:      Result Value   SARS Coronavirus 2 by RT PCR POSITIVE (*)    All other components within normal limits  COMPREHENSIVE METABOLIC PANEL - Abnormal; Notable for the following components:   Sodium 134 (*)    Calcium 7.9 (*)    Albumin 2.9 (*)    All other components within normal limits  CBC WITH DIFFERENTIAL/PLATELET - Abnormal; Notable for the following components:   MCV 77.2 (*)    MCH 24.8 (*)    RDW 17.4 (*)    Lymphs Abs 0.4 (*)    All other components within normal limits  URINALYSIS, COMPLETE (UACMP) WITH MICROSCOPIC - Abnormal; Notable for the following components:   APPearance CLOUDY (*)     Hgb urine dipstick MODERATE (*)    Protein, ur 100 (*)    Nitrite POSITIVE (*)    Leukocytes,Ua LARGE (*)    WBC, UA >50 (*)    Bacteria, UA MANY (*)    All other components within normal limits  C-REACTIVE PROTEIN - Abnormal; Notable for the following components:   CRP 1.4 (*)    All other components within normal limits  D-DIMER, QUANTITATIVE (NOT AT [facility]) - Abnormal; Notable for the following components:   D-Dimer, Quant 0.59 (*)    All other components within normal limits  FIBRINOGEN - Abnormal; Notable for the following components:   Fibrinogen 481 (*)    All other components within normal limits  CULTURE, BLOOD (ROUTINE X 2)  CULTURE, BLOOD (ROUTINE X 2)  URINE CULTURE  EXPECTORATED SPUTUM ASSESSMENT W REFEX TO RESP CULTURE  AMMONIA  FERRITIN  LACTATE DEHYDROGENASE  PROCALCITONIN  CBG MONITORING, ED  ABO/RH  TROPONIN I (HIGH SENSITIVITY)  TROPONIN I (HIGH SENSITIVITY)    EKG None  Radiology DG Chest Portable 1 View  Result Date: 08/15/2020 CLINICAL DATA:  Cough, dyspnea EXAM: PORTABLE CHEST 1 VIEW COMPARISON:  02/27/2014 FINDINGS: Lung volumes are small and pulmonary insufflation has diminished since prior examination, though this is in part related to increasing thoracic kyphosis. There is superimposed asymmetric airspace infiltrate within the right mid lung zone, likely infectious in the appropriate clinical setting. No pneumothorax or pleural effusion. Cardiac size within normal limits. No acute bone abnormality. IMPRESSION: Right mid lung zone focal pulmonary infiltrate, likely infectious in the acute setting. Electronically Signed   By: Helyn Numbers MD   On: 08/15/2020 00:37    Procedures .Critical Care Performed by: Marily Memos, MD Authorized by: Marily Memos, MD   Critical care provider statement:    Critical care time (minutes):  45   Critical care was necessary to treat or prevent imminent or life-threatening deterioration of the following  conditions:  Respiratory failure   Critical care was time spent personally by me on the following activities:  Discussions with consultants, evaluation of patient's response to treatment, examination of patient, ordering and performing treatments and interventions, ordering and review of laboratory studies, ordering and review of radiographic studies, pulse oximetry, re-evaluation of patient's condition, obtaining history from patient or surrogate and review of old charts   (including critical care time)  Medications Ordered in ED  Medications  remdesivir 100 mg in sodium chloride 0.9 % 100 mL IVPB (0 mg Intravenous Stopped 08/15/20 0335)    Followed by  remdesivir 100 mg in sodium chloride 0.9 % 100 mL IVPB (has no administration in time range)  dexamethasone (DECADRON) injection 6 mg (has no administration in time range)  heparin injection 5,000 Units (has no administration in time range)  feeding supplement (ENSURE ENLIVE / ENSURE PLUS) liquid 237 mL (has no administration in time range)  albuterol (VENTOLIN HFA) 108 (90 Base) MCG/ACT inhaler 2 puff (has no administration in time range)  guaiFENesin-dextromethorphan (ROBITUSSIN DM) 100-10 MG/5ML syrup 10 mL (has no administration in time range)  chlorpheniramine-HYDROcodone (TUSSIONEX) 10-8 MG/5ML suspension 5 mL (has no administration in time range)  ascorbic acid (VITAMIN C) tablet 500 mg (has no administration in time range)  zinc sulfate capsule 220 mg (has no administration in time range)  acetaminophen (TYLENOL) tablet 650 mg (has no administration in time range)  polyethylene glycol (MIRALAX / GLYCOLAX) packet 17 g (has no administration in time range)  ondansetron (ZOFRAN) tablet 4 mg (has no administration in time range)    Or  ondansetron (ZOFRAN) injection 4 mg (has no administration in time range)  cefTRIAXone (ROCEPHIN) 1 g in sodium chloride 0.9 % 100 mL IVPB (0 g Intravenous Stopped 08/15/20 0140)  azithromycin (ZITHROMAX)  500 mg in sodium chloride 0.9 % 250 mL IVPB (0 mg Intravenous Stopped 08/15/20 0245)  dexamethasone (DECADRON) injection 10 mg (10 mg Intravenous Given 08/15/20 0245)    ED Course  I have reviewed the triage vital signs and the nursing notes.  Pertinent labs & imaging results that were available during my care of the patient were reviewed by me and considered in my medical decision making (see chart for details).    MDM Rules/Calculators/A&P                          Patient initially thought to have likely infectious cause for symptoms but also check for metabolic causes.  Initially her chest x-ray was concerning for a lobar pneumonia so Rocephin and azithromycin were given as she does have a history of this and was hypoxic with a cough.  Next her u urinalysis revealed likely urinary tract infection however she is asymptomatic.  Urine culture will be sent.  If this happens to be positive she is already been treated with Rocephin for now.  Her Covid test also came back positive.  For the most part the rest of her work-up was unremarkable.  Remdesivir ordered.  Decadron ordered.  Family updated.  Patient will be admitted for hypoxic respiratory failure in the setting of Covid. Discussed with hospitalist.    Final Clinical Impression(s) / ED Diagnoses Final diagnoses:  Cough  COVID  Acute cystitis without hematuria    Rx / DC Orders ED Discharge Orders    None       Aaliyah Gavel, Barbara Cower, MD 08/15/20 [phone]

## 2020-08-15 NOTE — ED Notes (Signed)
Several of pt's 1000 medication still aren't available from pharmacy. Pharmacy has been notified. Next shift nurse, Arther Abbott, RN made aware of pt's need for medications once they are delivered to ED.

## 2020-08-15 NOTE — Progress Notes (Addendum)
ASSUMPTION OF CARE NOTE   08/15/2020 11:50 AM  [name] was seen and examined.  The H&P by the admitting provider, orders, imaging was reviewed.  Please see new orders.  Will continue to follow.  Pt remains on 2L/min .  Ordered nicotine patch.  Pt to transfer to [facility] due to no beds available at [facility] (administrative decision).   Pt stable to transfer.  PT evaluation recommending SNF.  Order placed for [facility] for SNF placement.  Fosfomycin 3 g x 1 dose given.    Vitals:   08/15/20 1030 08/15/20 1100  BP: 140/79 137/76  Pulse: 86 88  Resp: (!) 28 (!) 26  Temp:    SpO2: 99% 100%    Results for orders placed or performed during the hospital encounter of 08/14/20  Respiratory Panel by RT PCR (Flu A&B, Covid) - Nasopharyngeal Swab   Specimen: Nasopharyngeal Swab  Result Value Ref Range   SARS Coronavirus 2 by RT PCR POSITIVE (A) NEGATIVE   Influenza A by PCR NEGATIVE NEGATIVE   Influenza B by PCR NEGATIVE NEGATIVE  Blood culture (routine x 2)   Specimen: BLOOD  Result Value Ref Range   Specimen Description BLOOD RIGHT ANTECUBITAL    Special Requests      BOTTLES DRAWN AEROBIC AND ANAEROBIC Blood Culture adequate volume   Culture      NO GROWTH < 12 HOURS Performed at [facility], [address], [place], [place] [address]    Report Status PENDING   Blood culture (routine x 2)   Specimen: BLOOD  Result Value Ref Range   Specimen Description BLOOD RIGHT ANTECUBITAL    Special Requests      BOTTLES DRAWN AEROBIC AND ANAEROBIC Blood Culture adequate volume   Culture      NO GROWTH < 12 HOURS Performed at [facility], [address] [place], [place] [address]    Report Status PENDING   Comprehensive metabolic panel  Result Value Ref Range   Sodium 134 (L) 135 - 145 mmol/L   Potassium 3.9 3.5 - 5.1 mmol/L   Chloride 103 98 - 111 mmol/L   CO2 22 22 - 32 mmol/L   Glucose, Bld 98 70 - 99 mg/dL   BUN 23 8 - 23 mg/dL   Creatinine, Ser 4.19 0.44 - 1.00 mg/dL   Calcium 7.9 (L) 8.9  - 10.3 mg/dL   Total Protein 6.8 6.5 - 8.1 g/dL   Albumin 2.9 (L) 3.5 - 5.0 g/dL   AST 19 15 - 41 U/L   ALT 11 0 - 44 U/L   Alkaline Phosphatase 77 38 - 126 U/L   Total Bilirubin 0.5 0.3 - 1.2 mg/dL   GFR, Estimated >37 >90 mL/min   Anion gap 9 5 - 15  CBC WITH DIFFERENTIAL  Result Value Ref Range   WBC 6.1 4.0 - 10.5 K/uL   RBC 4.92 3.87 - 5.11 MIL/uL   Hemoglobin 12.2 12.0 - 15.0 g/dL   HCT 24.0 36 - 46 %   MCV 77.2 (L) 80.0 - 100.0 fL   MCH 24.8 (L) 26.0 - 34.0 pg   MCHC 32.1 30.0 - 36.0 g/dL   RDW 97.3 (H) 53.2 - 99.2 %   Platelets 388 150 - 400 K/uL   nRBC 0.0 0.0 - 0.2 %   Neutrophils Relative % 88 %   Neutro Abs 5.4 1.7 - 7.7 K/uL   Lymphocytes Relative 6 %   Lymphs Abs 0.4 (L) 0.7 - 4.0 K/uL  Monocytes Relative 5 %   Monocytes Absolute 0.3 0.1 - 1.0 K/uL   Eosinophils Relative 0 %   Eosinophils Absolute 0.0 0.0 - 0.5 K/uL   Basophils Relative 0 %   Basophils Absolute 0.0 0.0 - 0.1 K/uL   Immature Granulocytes 1 %   Abs Immature Granulocytes 0.04 0.00 - 0.07 K/uL  Urinalysis, Complete w Microscopic Urine, Catheterized  Result Value Ref Range   Color, Urine YELLOW YELLOW   APPearance CLOUDY (A) CLEAR   Specific Gravity, Urine 1.010 1.005 - 1.030   pH 6.0 5.0 - 8.0   Glucose, UA NEGATIVE NEGATIVE mg/dL   Hgb urine dipstick MODERATE (A) NEGATIVE   Bilirubin Urine NEGATIVE NEGATIVE   Ketones, ur NEGATIVE NEGATIVE mg/dL   Protein, ur 485 (A) NEGATIVE mg/dL   Nitrite POSITIVE (A) NEGATIVE   Leukocytes,Ua LARGE (A) NEGATIVE   RBC / HPF 21-50 0 - 5 RBC/hpf   WBC, UA >50 (H) 0 - 5 WBC/hpf   Bacteria, UA MANY (A) NONE SEEN   WBC Clumps PRESENT    Mucus PRESENT    Budding Yeast PRESENT   Ammonia  Result Value Ref Range   Ammonia 21 9 - 35 umol/L  C-reactive protein  Result Value Ref Range   CRP 1.4 (H) <1.0 mg/dL  D-dimer, quantitative (not at [facility])  Result Value Ref Range   D-Dimer, Quant 0.59 (H) 0.00 - 0.50 ug/mL-FEU  Ferritin  Result Value Ref Range    Ferritin 51 11 - 307 ng/mL  Fibrinogen  Result Value Ref Range   Fibrinogen 481 (H) 210 - 475 mg/dL  Lactate dehydrogenase  Result Value Ref Range   LDH 127 98 - 192 U/L  Procalcitonin  Result Value Ref Range   Procalcitonin <0.10 ng/mL  ABO/Rh  Result Value Ref Range   ABO/RH(D)      AB NEG Performed at [facility], [address], [place], [place] [address]   Troponin I (High Sensitivity)  Result Value Ref Range   Troponin I (High Sensitivity) 6 <18 ng/L  Troponin I (High Sensitivity)  Result Value Ref Range   Troponin I (High Sensitivity) 6 <18 ng/L   C. Laural Benes, MD [facility]   08/14/2020 10:33 PM How to contact the [facility] Attending or Consulting provider 7A - 7P or covering provider during after hours 7P -7A, for this patient?  1. Check the care team in [facility] and look for a) attending/consulting TRH provider listed and b) the [facility] team listed 2. Log into [email or web address] and use [facility] universal password to access. If you do not have the password, please contact the hospital operator. 3. Locate the [facility] provider you are looking for under [facility] and page to a number that you can be directly reached. 4. If you still have difficulty reaching the provider, please page the [facility] (Director on Call) for the Hospitalists listed on amion for assistance.

## 2020-08-15 NOTE — ED Notes (Signed)
Attempted to help pt reposition in bed, but pt was very insistent she didn't need help and could do it independently.

## 2020-08-15 NOTE — ED Notes (Signed)
Pt ate approximately 75% of meal and of liquid.

## 2020-08-15 NOTE — TOC Initial Note (Signed)
Transition of Care [facility]) - Initial/Assessment Note    Patient Details  Name: [name] MRN: [id number] Date of Birth: [birth year]  [facility] CM/SW Contact:    Annice Needy, LCSW Phone Number: 08/15/2020, 1:14 PM  Clinical Narrative:                 Patient from home alone. Admitted COVID+. Independent at baseline. Uses cane, [place], and wheelchair. Has raised toilet. PT recommendation for SNF discussed with daughter, Ms. Cobb, who states that patient will likely decline, however they want to wait until she is closer to discharge before a definite discharge plan is made.  TOC will continue to follow throughout hospital course.   Expected Discharge Plan: Home w Home Health Services Barriers to Discharge: Continued Medical Work up   Patient Goals and CMS Choice Patient states their goals for this hospitalization and ongoing recovery are:: home      Expected Discharge Plan and Services Expected Discharge Plan: Home w Home Health Services       Living arrangements for the past 2 months: Single Family Home                                      Prior Living Arrangements/Services Living arrangements for the past 2 months: Single Family Home Lives with:: Self Patient language and need for interpreter reviewed:: Yes Do you feel safe going back to the place where you live?: Yes      Need for Family Participation in Patient Care: Yes (Comment) Care giver support system in place?: Yes (comment) Current home services: DME Criminal Activity/Legal Involvement Pertinent to Current Situation/Hospitalization: No - Comment as needed  Activities of Daily Living      Permission Sought/Granted Permission sought to share information with : Family Supports    Share Information with NAME: Particia Jasper     Permission granted to share info w Relationship: daughter     Emotional Assessment Appearance:: Appears stated age   Affect (typically observed): Unable  to Assess   Alcohol / Substance Use: Not Applicable Psych Involvement: No (comment)  Admission diagnosis:  Cough [R05.9] Acute cystitis without hematuria [N30.00] COVID [U07.1] Pneumonia due to COVID-19 virus [U07.1, J12.82] Patient Active Problem List   Diagnosis Date Noted  . Pneumonia due to COVID-19 virus 08/15/2020  . Acute cystitis without hematuria   . Cough    PCP:  Assunta Found, MD Pharmacy:  No Pharmacies Listed    Social Determinants of Health (SDOH) Interventions    Readmission Risk Interventions No flowsheet data found.

## 2020-08-15 NOTE — H&P (Signed)
TRH H&P    Patient Demographics:    [name], is a 86 y.o. female  MRN: [id number]  DOB - Apr 08, 1926  Admit Date - 08/14/2020  Referring MD/NP/PA: [place]  Outpatient Primary MD for the patient is Assunta Found, MD  Patient coming from: Home  Chief complaint-occultly breathing   HPI:    [name]  is a 86 y.o. female, with history of hypertension presents to the ED chief complaint of difficulty breathing.  Patient reports that started less than 1 week ago.  She has had an associated productive cough with white sputum.  She has had no fevers.  She does report body aches especially in her back.  She has lost of taste and smell as well.  She has not had any diarrhea, nausea vomiting, chest pains.  She does report she was vaccinated for Covid.  She is a current smoker and she smokes half a pack per day.  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  In the ED Temperature 99.1, heart rate 78, respiratory rate 20, blood pressure 135/69, satting at 95% on 2 L nasal cannula Shows a sodium of 134, calcium 7.9, albumin 2.9 Laboratory markers reveal an LDH of 127, ferritin of 51, CRP 1.4, pro-Cal is less than 0.10, D-dimer 0.59, fibrinogen is 41 Troponins are negative x2 White blood cell count is 6.1 Chest x-ray shows mid right lung pneumonia Covid positive Started on remdesivir and Decadron Admission requested for further management    Review of systems:    Review of Systems  Constitutional: Positive for malaise/fatigue. Negative for chills and fever.  HENT: Negative for congestion, sinus pain and sore throat.   Eyes: Negative for pain, discharge and redness.  Respiratory: Positive for cough, sputum production and shortness of breath. Negative for hemoptysis and wheezing.   Cardiovascular: Negative for chest pain, palpitations, orthopnea and leg swelling.  Gastrointestinal:  Negative for abdominal pain, constipation, diarrhea, nausea and vomiting.  Genitourinary: Negative for dysuria, frequency and urgency.  Musculoskeletal: Positive for back pain and myalgias.  Skin: Negative for rash.  Neurological: Positive for weakness. Negative for focal weakness.  Endo/Heme/Allergies: Does not bruise/bleed easily.  Psychiatric/Behavioral: Negative for substance abuse.    All other systems reviewed and are negative.    Past History of the following :    Past Medical History:  Diagnosis Date  . Hypertension       No past surgical history on file.    Social History:      Social History   Tobacco Use  . Smoking status: Not on file  Substance Use Topics  . Alcohol use: Not on file       Family History :    No family history on file. Family history of hypertension   Home Medications:   Prior to Admission medications   Not on File     Allergies:     Allergies  Allergen Reactions  . Iohexol Itching, Swelling and Rash    pt allergic to prednisone she needs  IV premeds  . Nitrofurantoin   . Penicillins   . Prednisone   . Sulfa Antibiotics      Physical Exam:   Vitals  Blood pressure 135/69, pulse 78, temperature 99.1 F (37.3 C), temperature source Rectal, resp. rate 20, height  (1.651 m), weight 39.9 kg, SpO2 95 %.  1.  General: Lying supine in bed sleeping  2. Psychiatric: Mood and behavior normal for situation  3. Neurologic: Cranial nerves II through XII are grossly intact, no focal deficit on limited exam  4. HEENMT:  Head is atraumatic, normocephalic, right eye with cataracts, neck is cachectic, trachea is midline, mucous membranes are moist  5. Respiratory : Lungs are diminished in the lower lung fields  6. Cardiovascular : Heart rate is normal, rhythm is regular, no murmurs rubs or gallops, no peripheral edema  7. Gastrointestinal:  Abdomen is soft, nondistended, nontender to palpation  8. Skin:  No acute  lesions on limited skin exam  9.Musculoskeletal:  No acute deformity or peripheral edema    Data Review:    CBC Recent Labs  Lab 08/15/20 0042  WBC 6.1  HGB 12.2  HCT 38.0  PLT 388  MCV 77.2*  MCH 24.8*  MCHC 32.1  RDW 17.4*  LYMPHSABS 0.4*  MONOABS 0.3  EOSABS 0.0  BASOSABS 0.0   ------------------------------------------------------------------------------------------------------------------  Results for orders placed or performed during the hospital encounter of 08/14/20 (from the past 48 hour(s))  Respiratory Panel by RT PCR (Flu A&B, Covid) - Nasopharyngeal Swab     Status: Abnormal   Collection Time: 08/14/20 11:45 PM   Specimen: Nasopharyngeal Swab  Result Value Ref Range   SARS Coronavirus 2 by RT PCR POSITIVE (A) NEGATIVE    Comment: RESULT CALLED TO, READ BACK BY AND VERIFIED WITH: C TURNER,RN@[id number]  08/15/20 MKELLY (NOTE) SARS-CoV-2 target nucleic acids are DETECTED.  SARS-CoV-2 RNA is generally detectable in upper respiratory specimens  during the acute phase of infection. Positive results are indicative of the presence of the identified virus, but do not rule out bacterial infection or co-infection with other pathogens not detected by the test. Clinical correlation with patient history and other diagnostic information is necessary to determine patient infection status. The expected result is Negative.  Fact Sheet for Patients:  https://www.moore.com/  Fact Sheet for Healthcare Providers: https://www.young.biz/  This test is not yet approved or cleared by the [place] FDA and  has been authorized for detection and/or diagnosis of SARS-CoV-2 by FDA under an Emergency Use Authorization (EUA).  This EUA will remain in effect (meaning this test can be [place] ed) for the duration of  the COVID-19 declaration under Section 564(b)(1) of the Act, 21 U.S.C. section 360bbb-3(b)(1), unless the authorization is terminated  or revoked sooner.      Influenza A by PCR NEGATIVE NEGATIVE   Influenza B by PCR NEGATIVE NEGATIVE    Comment: (NOTE) The Xpert Xpress SARS-CoV-2/FLU/RSV assay is intended as an aid in  the diagnosis of influenza from Nasopharyngeal swab specimens and  should not be used as a sole basis for treatment. Nasal washings and  aspirates are unacceptable for Xpert Xpress SARS-CoV-2/FLU/RSV  testing.  Fact Sheet for Patients: https://www.moore.com/  Fact Sheet for Healthcare Providers: https://www.young.biz/  This test is not yet approved or cleared by the [place] FDA and  has been authorized for detection and/or diagnosis of SARS-CoV-2 by  FDA under an Emergency Use Authorization (EUA). This EUA will remain  in effect (meaning this test can be  used) for the duration of the  Covid-19 declaration under Section 564(b)(1) of the Act, 21  U.S.C. section 360bbb-3(b)(1), unless the authorization is  terminated or revoked. Performed at [facility], [address], [place], [place] [address]   Urinalysis, Complete w Microscopic Urine, Catheterized     Status: Abnormal   Collection Time: 08/15/20 12:00 AM  Result Value Ref Range   Color, Urine YELLOW YELLOW   APPearance CLOUDY (A) CLEAR   Specific Gravity, Urine 1.010 1.005 - 1.030   pH 6.0 5.0 - 8.0   Glucose, UA NEGATIVE NEGATIVE mg/dL   Hgb urine dipstick MODERATE (A) NEGATIVE   Bilirubin Urine NEGATIVE NEGATIVE   Ketones, ur NEGATIVE NEGATIVE mg/dL   Protein, ur 122 (A) NEGATIVE mg/dL   Nitrite POSITIVE (A) NEGATIVE   Leukocytes,Ua LARGE (A) NEGATIVE   RBC / HPF 21-50 0 - 5 RBC/hpf   WBC, UA >50 (H) 0 - 5 WBC/hpf   Bacteria, UA MANY (A) NONE SEEN   WBC Clumps PRESENT    Mucus PRESENT    Budding Yeast PRESENT     Comment: Performed at [facility], [address], [place], [place] [address]  Comprehensive metabolic panel     Status: Abnormal   Collection Time: 08/15/20 12:42 AM  Result  Value Ref Range   Sodium 134 (L) 135 - 145 mmol/L   Potassium 3.9 3.5 - 5.1 mmol/L   Chloride 103 98 - 111 mmol/L   CO2 22 22 - 32 mmol/L   Glucose, Bld 98 70 - 99 mg/dL    Comment: Glucose reference range applies only to samples taken after fasting for at least 8 hours.   BUN 23 8 - 23 mg/dL   Creatinine, Ser 3.00 0.44 - 1.00 mg/dL   Calcium 7.9 (L) 8.9 - 10.3 mg/dL   Total Protein 6.8 6.5 - 8.1 g/dL   Albumin 2.9 (L) 3.5 - 5.0 g/dL   AST 19 15 - 41 U/L   ALT 11 0 - 44 U/L   Alkaline Phosphatase 77 38 - 126 U/L   Total Bilirubin 0.5 0.3 - 1.2 mg/dL   GFR, Estimated >51 >10 mL/min   Anion gap 9 5 - 15    Comment: Performed at [facility], [address] [place], [place] [address]  CBC WITH DIFFERENTIAL     Status: Abnormal   Collection Time: 08/15/20 12:42 AM  Result Value Ref Range   WBC 6.1 4.0 - 10.5 K/uL   RBC 4.92 3.87 - 5.11 MIL/uL   Hemoglobin 12.2 12.0 - 15.0 g/dL   HCT 35.6 36 - 46 %   MCV 77.2 (L) 80.0 - 100.0 fL   MCH 24.8 (L) 26.0 - 34.0 pg   MCHC 32.1 30.0 - 36.0 g/dL   RDW 70.1 (H) 41.0 - 30.1 %   Platelets 388 150 - 400 K/uL   nRBC 0.0 0.0 - 0.2 %   Neutrophils Relative % 88 %   Neutro Abs 5.4 1.7 - 7.7 K/uL   Lymphocytes Relative 6 %   Lymphs Abs 0.4 (L) 0.7 - 4.0 K/uL   Monocytes Relative 5 %   Monocytes Absolute 0.3 0.1 - 1.0 K/uL   Eosinophils Relative 0 %   Eosinophils Absolute 0.0 0.0 - 0.5 K/uL   Basophils Relative 0 %   Basophils Absolute 0.0 0.0 - 0.1 K/uL   Immature Granulocytes 1 %   Abs Immature Granulocytes 0.04 0.00 - 0.07 K/uL    Comment: Performed at [facility],  [address] [place], [place] [address]  Ammonia     Status: None   Collection Time: 08/15/20 12:42 AM  Result Value Ref Range   Ammonia 21 9 - 35 umol/L    Comment: Performed at [facility], [address] [place], [place] [address]  Troponin I (High Sensitivity)     Status: None   Collection Time: 08/15/20 12:42 AM  Result Value Ref Range   Troponin I (High  Sensitivity) 6 <18 ng/L    Comment: (NOTE) Elevated high sensitivity troponin I (hsTnI) values and significant  changes across serial measurements may suggest ACS but many other  chronic and acute conditions are known to elevate hsTnI results.  Refer to the "Links" section for chest pain algorithms and additional  guidance. Performed at [facility], [address] [place], [place] [address]   ABO/Rh     Status: None   Collection Time: 08/15/20 12:42 AM  Result Value Ref Range   ABO/RH(D)      AB NEG Performed at [facility], [address] [place], [place] [address]   C-reactive protein     Status: Abnormal   Collection Time: 08/15/20 12:42 AM  Result Value Ref Range   CRP 1.4 (H) <1.0 mg/dL    Comment: Performed at [facility], [address] [place], [place] [address]  D-dimer, quantitative (not at [facility])     Status: Abnormal   Collection Time: 08/15/20 12:42 AM  Result Value Ref Range   D-Dimer, Quant 0.59 (H) 0.00 - 0.50 ug/mL-FEU    Comment: (NOTE) At the manufacturer cut-off value of 0.5 g/mL FEU, this assay has a negative predictive value of 95-100%.This assay is intended for use in conjunction with a clinical pretest probability (PTP) assessment model to exclude pulmonary embolism (PE) and deep venous thrombosis (DVT) in outpatients suspected of PE or DVT. Results should be correlated with clinical presentation. Performed at [facility], [address] [place], [place] [address]   Ferritin     Status: None   Collection Time: 08/15/20 12:42 AM  Result Value Ref Range   Ferritin 51 11 - 307 ng/mL    Comment: Performed at [facility], [address] [place], [place] [address]  Fibrinogen     Status: Abnormal   Collection Time: 08/15/20 12:42 AM  Result Value Ref Range   Fibrinogen 481 (H) 210 - 475 mg/dL    Comment: Performed at [facility], [address] [place], [place] [address]  Lactate dehydrogenase     Status: None   Collection Time: 08/15/20 12:42 AM    Result Value Ref Range   LDH 127 98 - 192 U/L    Comment: Performed at [facility], [address] [place], [place] [address]  Procalcitonin     Status: None   Collection Time: 08/15/20 12:42 AM  Result Value Ref Range   Procalcitonin <0.10 ng/mL    Comment:        Interpretation: PCT (Procalcitonin) <= 0.5 ng/mL: Systemic infection (sepsis) is not likely. Local bacterial infection is possible. (NOTE)       Sepsis PCT Algorithm           Lower Respiratory Tract                                      Infection PCT Algorithm    ----------------------------     ----------------------------  PCT < 0.25 ng/mL                PCT < 0.10 ng/mL          Strongly encourage             Strongly discourage   discontinuation of antibiotics    initiation of antibiotics    ----------------------------     -----------------------------       PCT 0.25 - 0.50 ng/mL            PCT 0.10 - 0.25 ng/mL               OR       >80% decrease in PCT            Discourage initiation of                                            antibiotics      Encourage discontinuation           of antibiotics    ----------------------------     -----------------------------         PCT >= 0.50 ng/mL              PCT 0.26 - 0.50 ng/mL               AND        <80% decrease in PCT             Encourage initiation of                                             antibiotics       Encourage continuation           of antibiotics    ----------------------------     -----------------------------        PCT >= 0.50 ng/mL                  PCT > 0.50 ng/mL               AND         increase in PCT                  Strongly encourage                                      initiation of antibiotics    Strongly encourage escalation           of antibiotics                                     -----------------------------                                           PCT <= 0.25 ng/mL  OR                                        > 80% decrease in PCT                                      Discontinue / Do not initiate                                             antibiotics  Performed at [facility], [address] [place], [place] [address]   Troponin I (High Sensitivity)     Status: None   Collection Time: 08/15/20  2:59 AM  Result Value Ref Range   Troponin I (High Sensitivity) 6 <18 ng/L    Comment: (NOTE) Elevated high sensitivity troponin I (hsTnI) values and significant  changes across serial measurements may suggest ACS but many other  chronic and acute conditions are known to elevate hsTnI results.  Refer to the "Links" section for chest pain algorithms and additional  guidance. Performed at [facility], [address], [place], [place] [address]     Chemistries  Recent Labs  Lab 08/15/20 0042  NA 134*  K 3.9  CL 103  CO2 22  GLUCOSE 98  BUN 23  CREATININE 0.63  CALCIUM 7.9*  AST 19  ALT 11  ALKPHOS 77  BILITOT 0.5   ------------------------------------------------------------------------------------------------------------------  ------------------------------------------------------------------------------------------------------------------ GFR: Estimated Creatinine Clearance: 28.3 mL/min (by C-G formula based on SCr of 0.63 mg/dL). Liver Function Tests: Recent Labs  Lab 08/15/20 0042  AST 19  ALT 11  ALKPHOS 77  BILITOT 0.5  PROT 6.8  ALBUMIN 2.9*   No results for input(s): LIPASE, AMYLASE in the last 168 hours. Recent Labs  Lab 08/15/20 0042  AMMONIA 21   Coagulation Profile: No results for input(s): INR, PROTIME in the last 168 hours. Cardiac Enzymes: No results for input(s): CKTOTAL, CKMB, CKMBINDEX, TROPONINI in the last 168 hours. BNP (last 3 results) No results for input(s): PROBNP in the last 8760 hours. HbA1C: No results for input(s): HGBA1C in the last 72 hours. CBG: No results for input(s): GLUCAP in the  last 168 hours. Lipid Profile: No results for input(s): CHOL, HDL, LDLCALC, TRIG, CHOLHDL, LDLDIRECT in the last 72 hours. Thyroid Function Tests: No results for input(s): TSH, T4TOTAL, FREET4, T3FREE, THYROIDAB in the last 72 hours. Anemia Panel: Recent Labs    08/15/20 0042  FERRITIN 51    --------------------------------------------------------------------------------------------------------------- Urine analysis:    Component Value Date/Time   COLORURINE YELLOW 08/15/2020 0000   APPEARANCEUR CLOUDY (A) 08/15/2020 0000   LABSPEC 1.010 08/15/2020 0000   PHURINE 6.0 08/15/2020 0000   GLUCOSEU NEGATIVE 08/15/2020 0000   HGBUR MODERATE (A) 08/15/2020 0000   BILIRUBINUR NEGATIVE 08/15/2020 0000   KETONESUR NEGATIVE 08/15/2020 0000   PROTEINUR 100 (A) 08/15/2020 0000   NITRITE POSITIVE (A) 08/15/2020 0000   LEUKOCYTESUR LARGE (A) 08/15/2020 0000      Imaging Results:    DG Chest Portable 1 View  Result Date: 08/15/2020 CLINICAL DATA:  Cough, dyspnea EXAM: PORTABLE CHEST 1 VIEW COMPARISON:  02/27/2014 FINDINGS: Lung volumes are small and pulmonary insufflation has diminished since prior examination, though this is  in part related to increasing thoracic kyphosis. There is superimposed asymmetric airspace infiltrate within the right mid lung zone, likely infectious in the appropriate clinical setting. No pneumothorax or pleural effusion. Cardiac size within normal limits. No acute bone abnormality. IMPRESSION: Right mid lung zone focal pulmonary infiltrate, likely infectious in the acute setting. Electronically Signed   By: Helyn Numbers MD   On: 08/15/2020 00:37      Assessment & Plan:    Active Problems:   Pneumonia due to COVID-19 virus   1. Acute hypoxic respiratory failure 1. Requiring 2 L nasal cannula 2. Oxygen at baseline 3. Oxygen sats reportedly were lower than 90% prior to arrival 4. Lowest oxygen sat recorded here 90% 5. Secondary to Covid 19 and lobar  pneumonia 6. Wean off O2 as tolerated 7. Troponins negative x2 8. Chest x-ray shows lobar pneumonia 9. Continue to monitor 2. COVID-19 infection 1. Covid positive 2. Continue remdesivir and Decadron 3. Continue inhalers and antitussives 4. Patient was vaccinated for Covid 5. Trend inflammatory markers 6. Continue to monitor 7. Chest x-ray shows possible lobar pneumonia superimposed, but pro-Cal less than 0.100 superimposed bacterial infection is less likely 3. Hypertension 1. Continue to monitor 4. Protein calorie malnutrition 1. Protein shakes   DVT Prophylaxis-Heparin and SCDs  AM Labs Ordered, also please review Full Orders  Family Communication: No family at bedside Code Status:  DNR  Admission status: Inpatient :The appropriate admission status for this patient is INPATIENT. Inpatient status is judged to be reasonable and necessary in order to provide the required intensity of service to ensure the patient's safety. The patient's presenting symptoms, physical exam findings, and initial radiographic and laboratory data in the context of their chronic comorbidities is felt to place them at high risk for further clinical deterioration. Furthermore, it is not anticipated that the patient will be medically stable for discharge from the hospital within 2 midnights of admission. The following factors support the admission status of inpatient.     The patient's presenting symptoms include shortness of breath The worrisome physical exam findings include hypoxia requiring 2 L nasal cannula The initial radiographic and laboratory data are worrisome because of chest x-ray shows right midlung zone focal pulmonary infiltrate likely infectious in the acute setting The chronic co-morbidities include hypertension       * I certify that at the point of admission it is my clinical judgment that the patient will require inpatient hospital care spanning beyond 2 midnights from the point of  admission due to high intensity of service, high risk for further deterioration and high frequency of surveillance required.*  Time spent in minutes : 64   Merrie Epler B Zierle-Ghosh DO

## 2020-08-15 NOTE — Evaluation (Signed)
Physical Therapy Evaluation Patient Details Name: [name] MRN: [id number] DOB: [birth year] Today's Date: 08/15/2020   History of Present Illness  [name]  is a 86 y.o. female, with history of hypertension presents to the ED chief complaint of difficulty breathing.  Patient reports that started less than 1 week ago.  She has had an associated productive cough with white sputum.  She has had no fevers.  She does report body aches especially in her back.  She has lost of taste and smell as well.  She has not had any diarrhea, nausea vomiting, chest pains.  She does report she was vaccinated for Covid.  She is a current smoker and she smokes half a pack per day.  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    Clinical Impression  Patient very unsteady on feet with frequent bumping into nearby objects mostly due to blindness, at high risk for falls, SpO2 dropped from 90% to 88% while on 3.5 LPM when taking steps in room and limited due to fatigue.  Patient put back to bed after therapy.  Patient will benefit from continued physical therapy in hospital and recommended venue below to increase strength, balance, endurance for safe ADLs and gait.    Follow Up Recommendations SNF;Supervision/Assistance - 24 hour;Supervision for mobility/OOB    Equipment Recommendations  None recommended by PT    Recommendations for Other Services       Precautions / Restrictions Precautions Precautions: Fall Restrictions Weight Bearing Restrictions: No      Mobility  Bed Mobility Overal bed mobility: Needs Assistance Bed Mobility: Supine to Sit;Sit to Supine     Supine to sit: Min guard;Min assist Sit to supine: Min assist   General bed mobility comments: slow labored movement, frequent verbal/tacile cueing mostly due to blindness  Transfers Overall transfer level: Needs assistance Equipment used: Rolling Sorn (2 wheeled) Transfers: Sit to/from [employer] Sit to Stand: Min assist Stand pivot transfers: Min assist       General transfer comment: increased time, labored movement, freuqent verbal/tactile cueing mostly due to blindness  Ambulation/Gait Ambulation/Gait assistance: Mod assist Gait Distance (Feet): 12 Feet Assistive device: Rolling Crooker (2 wheeled) Gait Pattern/deviations: Decreased step length - right;Decreased step length - left;Decreased stride length;Trunk flexed Gait velocity: decreased   General Gait Details: slow labored movement with frequent bumping into nearby objects mostly due to blindness, limited secondary to fatigue and SpO2 dropping from 90% to 86% while on 3.5 LPM O2  Stairs            Wheelchair Mobility    Modified Rankin (Stroke Patients Only)       Balance Overall balance assessment: Needs assistance Sitting-balance support: No upper extremity supported;Feet supported Sitting balance-Leahy Scale: Fair Sitting balance - Comments: fair/good seated at EOB   Standing balance support: Bilateral upper extremity supported;During functional activity Standing balance-Leahy Scale: Fair Standing balance comment: using RW                             Pertinent Vitals/Pain Pain Assessment: No/denies pain    Home Living Family/patient expects to be discharged to:: Private residence Living Arrangements: Alone Available Help at Discharge: Family;Available 24 hours/day Type of Home: House Home Access: Stairs to enter Entrance Stairs-Rails: Right Entrance Stairs-Number of Steps: 5 Home Layout: One level Home Equipment: [place] - 2 wheels;Bedside commode;Shower seat;Wheelchair - manual  Prior Function Level of Independence: Needs assistance   Gait / Transfers Assistance Needed: household ambulator using RW, blind  ADL's / Homemaking Assistance Needed: assisted by family        Hand Dominance        Extremity/Trunk Assessment   Upper Extremity  Assessment Upper Extremity Assessment: Generalized weakness    Lower Extremity Assessment Lower Extremity Assessment: Generalized weakness    Cervical / Trunk Assessment Cervical / Trunk Assessment: Kyphotic  Communication   Communication: No difficulties  Cognition Arousal/Alertness: Awake/alert Behavior During Therapy: WFL for tasks assessed/performed Overall Cognitive Status: Within Functional Limits for tasks assessed                                        General Comments      Exercises     Assessment/Plan    PT Assessment Patient needs continued PT services  PT Problem List Decreased strength;Decreased activity tolerance;Decreased balance;Decreased mobility       PT Treatment Interventions DME instruction;Gait training;Stair training;Functional mobility training;Therapeutic activities;Therapeutic exercise;Balance training;Patient/family education    PT Goals (Current goals can be found in the Care Plan section)  Acute Rehab PT Goals Patient Stated Goal: return home with family to assist PT Goal Formulation: With patient Time For Goal Achievement: 08/29/20 Potential to Achieve Goals: Good    Frequency Min 3X/week   Barriers to discharge        Co-evaluation               AM-PAC PT "6 Clicks" Mobility  Outcome Measure Help needed turning from your back to your side while in a flat bed without using bedrails?: None Help needed moving from lying on your back to sitting on the side of a flat bed without using bedrails?: A Little Help needed moving to and from a bed to a chair (including a wheelchair)?: A Little Help needed standing up from a chair using your arms (e.g., wheelchair or bedside chair)?: A Little Help needed to walk in hospital room?: A Lot Help needed climbing 3-5 steps with a railing? : A Lot 6 Click Score: 17    End of Session Equipment Utilized During Treatment: Oxygen Activity Tolerance: Patient tolerated treatment  well;Patient limited by fatigue Patient left: in bed;with call bell/phone within reach;with nursing/sitter in room Nurse Communication: Mobility status PT Visit Diagnosis: Unsteadiness on feet (R26.81);Other abnormalities of gait and mobility (R26.89);Muscle weakness (generalized) (M62.81)    Time: 7425-9563 PT Time Calculation (min) (ACUTE ONLY): 33 min   Charges:   PT Evaluation $PT Eval Moderate Complexity: 1 Mod PT Treatments $Therapeutic Activity: 23-37 mins        11:10 AM, 08/15/20 Ocie Bob, MPT Physical Therapist with [facility] [phone] office [phone] mobile phone

## 2020-08-15 NOTE — ED Notes (Signed)
Verbal consent obtained by Arther Abbott, RN and Camillia Herter, RN E-signature pad not working.

## 2020-08-15 NOTE — Plan of Care (Signed)
°  Problem: Acute Rehab PT Goals(only PT should resolve) Goal: Pt Will Go Supine/Side To Sit Outcome: Progressing Flowsheets (Taken 08/15/2020 1112) Pt will go Supine/Side to Sit: with supervision Goal: Patient Will Transfer Sit To/From Stand Outcome: Progressing Flowsheets (Taken 08/15/2020 1112) Patient will transfer sit to/from stand: with min guard assist Goal: Pt Will Transfer Bed To Chair/Chair To Bed Outcome: Progressing Flowsheets (Taken 08/15/2020 1112) Pt will Transfer Bed to Chair/Chair to Bed: min guard assist Goal: Pt Will Ambulate Outcome: Progressing Flowsheets (Taken 08/15/2020 1112) Pt will Ambulate:  50 feet  with minimal assist  with rolling Fults   11:12 AM, 08/15/20 Ocie Bob, MPT Physical Therapist with [facility] [phone] office [phone] mobile phone w

## 2020-08-15 NOTE — ED Notes (Signed)
Date and time results received: 08/15/20 1:20 AM(use smartphrase ".now" to insert current time)  Test: covid Critical Value: positive  Name of Provider Notified: Dr Clayborne Dana  Orders Received? Or Actions Taken?: see chart

## 2020-08-16 DIAGNOSIS — L8995 Pressure ulcer of unspecified site, unstageable: Secondary | ICD-10-CM

## 2020-08-16 DIAGNOSIS — L8990 Pressure ulcer of unspecified site, unspecified stage: Secondary | ICD-10-CM | POA: Diagnosis present

## 2020-08-16 DIAGNOSIS — J1282 Pneumonia due to Coronavirus disease 2019: Secondary | ICD-10-CM | POA: Diagnosis not present

## 2020-08-16 DIAGNOSIS — U071 COVID-19: Secondary | ICD-10-CM | POA: Diagnosis not present

## 2020-08-16 LAB — CBC WITH DIFFERENTIAL/PLATELET
Abs Immature Granulocytes: 0.07 10*3/uL (ref 0.00–0.07)
Basophils Absolute: 0 10*3/uL (ref 0.0–0.1)
Basophils Relative: 0 %
Eosinophils Absolute: 0 10*3/uL (ref 0.0–0.5)
Eosinophils Relative: 0 %
HCT: 36.2 % (ref 36.0–46.0)
Hemoglobin: 11.7 g/dL — ABNORMAL LOW (ref 12.0–15.0)
Immature Granulocytes: 1 %
Lymphocytes Relative: 11 %
Lymphs Abs: 1 10*3/uL (ref 0.7–4.0)
MCH: 24.6 pg — ABNORMAL LOW (ref 26.0–34.0)
MCHC: 32.3 g/dL (ref 30.0–36.0)
MCV: 76.2 fL — ABNORMAL LOW (ref 80.0–100.0)
Monocytes Absolute: 0.7 10*3/uL (ref 0.1–1.0)
Monocytes Relative: 8 %
Neutro Abs: 7.6 10*3/uL (ref 1.7–7.7)
Neutrophils Relative %: 80 %
Platelets: 427 10*3/uL — ABNORMAL HIGH (ref 150–400)
RBC: 4.75 MIL/uL (ref 3.87–5.11)
RDW: 17.1 % — ABNORMAL HIGH (ref 11.5–15.5)
WBC: 9.4 10*3/uL (ref 4.0–10.5)
nRBC: 0 % (ref 0.0–0.2)

## 2020-08-16 LAB — COMPREHENSIVE METABOLIC PANEL
ALT: 11 U/L (ref 0–44)
AST: 20 U/L (ref 15–41)
Albumin: 2.3 g/dL — ABNORMAL LOW (ref 3.5–5.0)
Alkaline Phosphatase: 65 U/L (ref 38–126)
Anion gap: 8 (ref 5–15)
BUN: 29 mg/dL — ABNORMAL HIGH (ref 8–23)
CO2: 21 mmol/L — ABNORMAL LOW (ref 22–32)
Calcium: 8.3 mg/dL — ABNORMAL LOW (ref 8.9–10.3)
Chloride: 105 mmol/L (ref 98–111)
Creatinine, Ser: 0.69 mg/dL (ref 0.44–1.00)
GFR, Estimated: >60 mL/min (ref >60)
Glucose, Bld: 175 mg/dL — ABNORMAL HIGH (ref 70–99)
Potassium: 4 mmol/L (ref 3.5–5.1)
Sodium: 134 mmol/L — ABNORMAL LOW (ref 135–145)
Total Bilirubin: 0.4 mg/dL (ref 0.3–1.2)
Total Protein: 6.3 g/dL — ABNORMAL LOW (ref 6.5–8.1)

## 2020-08-16 LAB — D-DIMER, QUANTITATIVE: D-Dimer, Quant: 0.6 ug/mL-FEU — ABNORMAL HIGH (ref 0.00–0.50)

## 2020-08-16 LAB — C-REACTIVE PROTEIN: CRP: 1 mg/dL — ABNORMAL HIGH (ref <1.0)

## 2020-08-16 LAB — BRAIN NATRIURETIC PEPTIDE: B Natriuretic Peptide: 206.9 pg/mL — ABNORMAL HIGH (ref 0.0–100.0)

## 2020-08-16 LAB — MAGNESIUM: Magnesium: 1.8 mg/dL (ref 1.7–2.4)

## 2020-08-16 NOTE — Progress Notes (Signed)
Report given to [place] on 5N. Patient transported from 5W-30 to 5N-27. All belongings sent with patient.

## 2020-08-16 NOTE — Evaluation (Signed)
Occupational Therapy Evaluation Patient Details Name: [name] MRN: [id number] DOB: 1926/05/18 Today's Date: 08/16/2020    History of Present Illness [name] is a 86 y.o. female, with history of hypertension presents to the ED chief complaint of difficulty breathing.  Patient reports that started less than 1 week ago.  She has had an associated productive cough with white sputum.  She has had no fevers.  She does report body aches especially in her back.  She has lost of taste and smell as well.  She has not had any diarrhea, nausea vomiting, chest pains.  She does report she was vaccinated for Covid.  She is a current smoker and she smokes half a pack per day.  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   Clinical Impression    PTA, pt was living alone and performing BADLs with use of RW. Pt performing ADLs and functional mobility with RW at [facility] A level. Pt requiring assistance for ADLs in standing for balance. Pt presenting with decreased activity tolerance requiring seated rest breaks. Very motivated to participate in therapy. SpO2 >86% on 3L throughout session. Pt would benefit from further acute OT to facilitate safe dc. Pending family support, recommend dc to home with HHOT for further OT to optimize safety, independence with ADLs, and return to PLOF.   Called daughter, [name], and discussed family support and dc options. [name] reporting that family has organized affairs for pt to stay at her sister's home. Providing information on patient current functional status.    Follow Up Recommendations  Home health OT;Supervision/Assistance - 24 hour (will need snf if 24/7 unable to be provided)    Equipment Recommendations  3 in 1 bedside commode    Recommendations for Other Services PT consult     Precautions / Restrictions Precautions Precautions: Fall Restrictions Weight Bearing Restrictions: No      Mobility Bed Mobility                General bed mobility comments: In recliner upon arrival  Transfers Overall transfer level: Needs assistance Equipment used: Rolling [place] (2 wheeled) Transfers: Sit to/from [employer] Sit to Stand: Min assist         General transfer comment: Min A for power up and to gain balance in standing    Balance Overall balance assessment: Needs assistance Sitting-balance support: No upper extremity supported;Feet supported Sitting balance-Leahy Scale: Fair Sitting balance - Comments: fair/good seated at EOB   Standing balance support: Bilateral upper extremity supported;During functional activity Standing balance-Leahy Scale: Poor Standing balance comment: Reliant on atleast one UE for support                           ADL either performed or assessed with clinical judgement   ADL Overall ADL's : Needs assistance/impaired Eating/Feeding: Supervision/ safety;Set up;Sitting   Grooming: Oral care;Minimal assistance;Sitting;Wash/dry hands Grooming Details (indicate cue type and reason): Requiring Min A due to vision deficits. Needing assistance to manage grooming tools and rinse of dentures. Min A to locating soap to wash hands after toileting Upper Body Bathing: Minimal assistance;Sitting   Lower Body Bathing: Minimal assistance;Sit to/from stand   Upper Body Dressing : Minimal assistance;Sitting   Lower Body Dressing: Minimal assistance;Sit to/from stand Lower Body Dressing Details (indicate cue type and reason): Doffing soiled underwear. Min A for maintaining balance while pulling new underwear over hips in standing.  Toilet Transfer: Minimal assistance;Ambulation;RW;BSC Toilet Transfer Details (indicate cue type and reason): Min A for power up Toileting- Clothing Manipulation and Hygiene: Minimal assistance;Sit to/from stand Toileting - Clothing Manipulation Details (indicate cue type and reason): Pt able to perform peri care after BM with  Min A for standing balance and management of gown     Functional mobility during ADLs: Min guard;Rolling [place] General ADL Comments: Pt presenting with decreased activity tolerance requiring several rest breaks. Performing grooming at sink and then pt with bowel incontience and sitting at [facility] to complete toileting. Requiring increased assistance with vision deficits in unknown environment     Vision Baseline Vision/History: Legally blind Patient Visual Report: No change from baseline Additional Comments: Can only see outlines of objects. Needs good contrast.     Perception     Praxis      Pertinent Vitals/Pain Pain Assessment: Faces Faces Pain Scale: Hurts little more Pain Location: Bruise and sore at mid back.  Pain Descriptors / Indicators: Grimacing Pain Intervention(s): Monitored during session;Limited activity within patient's tolerance;Repositioned     Hand Dominance Right   Extremity/Trunk Assessment Upper Extremity Assessment Upper Extremity Assessment: Generalized weakness   Lower Extremity Assessment Lower Extremity Assessment: Defer to PT evaluation   Cervical / Trunk Assessment Cervical / Trunk Assessment: Kyphotic   Communication Communication Communication: HOH   Cognition Arousal/Alertness: Awake/alert Behavior During Therapy: WFL for tasks assessed/performed Overall Cognitive Status: Within Functional Limits for tasks assessed                                 General Comments: Following commands. Feel she is at baseline. Very sweet and patient   General Comments  SpO2 >86% on 3L    Exercises     Shoulder Instructions      Home Living Family/patient expects to be discharged to:: Private residence Living Arrangements: Alone Available Help at Discharge: Family;Available 24 hours/day Type of Home: House Home Access: Stairs to enter [employer] of Steps: 5 Entrance Stairs-Rails: Right Home Layout: One level      Bathroom Shower/Tub: Tub/shower unit (Sponge bathes)   Bathroom Toilet: Standard Bathroom Accessibility: Yes   Home Equipment: [place] - 2 wheels;Wheelchair - manual;Cane - single point;Shower seat          Prior Functioning/Environment Level of Independence: Needs assistance  Gait / Transfers Assistance Needed: Uses RW for mobiltiy in home. w/c for out of home like MD apt ADL's / Homemaking Assistance Needed: Pt performs BADLs. Family assists with IADLs. Pt provided microwave meals and she receives meals on wheels.  Family cleans her home.            OT Problem List: Decreased strength;Decreased range of motion;Decreased activity tolerance;Impaired balance (sitting and/or standing);Decreased knowledge of use of DME or AE;Decreased knowledge of precautions;Pain;Cardiopulmonary status limiting activity;Decreased safety awareness      OT Treatment/Interventions: Self-care/ADL training;Therapeutic exercise;Energy conservation;DME and/or AE instruction;Therapeutic activities;Patient/family education    OT Goals(Current goals can be found in the care plan section) Acute Rehab OT Goals Patient Stated Goal: return home with family to assist OT Goal Formulation: With patient/family Time For Goal Achievement: 08/30/20 Potential to Achieve Goals: Good  OT Frequency: Min 3X/week   Barriers to D/C:            Co-evaluation              AM-PAC OT "6 Clicks" Daily Activity     Outcome  Measure Help from another person eating meals?: A Little Help from another person taking care of personal grooming?: A Little Help from another person toileting, which includes using toliet, bedpan, or urinal?: A Little Help from another person bathing (including washing, rinsing, drying)?: A Little Help from another person to put on and taking off regular upper body clothing?: A Little Help from another person to put on and taking off regular lower body clothing?: A Little 6 Click Score: 18    End of Session Equipment Utilized During Treatment: Rolling Sanda;Oxygen (3L) Nurse Communication: Mobility status  Activity Tolerance: Patient limited by fatigue Patient left: in chair;with call bell/phone within reach;with nursing/sitter in room  OT Visit Diagnosis: Unsteadiness on feet (R26.81);Other abnormalities of gait and mobility (R26.89);Muscle weakness (generalized) (M62.81);Pain;History of falling (Z91.81) Pain - part of body:  (back)                Time: 1572-6203 OT Time Calculation (min): 45 min Charges:  OT General Charges $OT Visit: 1 Visit OT Evaluation $OT Eval Moderate Complexity: 1 Mod OT Treatments $Self Care/Home Management : 23-37 mins  [name] MSOT, OTR/L Acute Rehab Pager: [phone] Office: [phone]  [name] 08/16/2020, 1:05 PM

## 2020-08-16 NOTE — Progress Notes (Signed)
Physical Therapy Treatment Patient Details Name: [name] MRN: [id number] DOB: [birth year] Today's Date: 08/16/2020    History of Present Illness Pt is a 86 y.o. female admitted to [facility] 08/14/20 with difficulty breathing, cough, lost of taste/smell; transfer to [facility] due to no beds available. Workup for acute hypoxic respiratory failure due to COVID-19. PMH includes HTN, legally blind, current smoker; pt reports she was vaccinated for COVID.   PT Comments    Pt progressing well with mobility. Today's session focused on transfer training with RW, pt demonstrating improved stability with this and in-room ambulation using RW, up to minA for mobility. Pt limited by generalized weakness, decreased activity tolerance and impaired balance strategies. OT spoke with pt's daughter who reports family hopes to have pt d/c to their home with increased assist; recommend this with HHPT services. If 24/7 assist not available, recommend SNF-level therapies to maximize functional mobility and independence as pt at high risk for falls.  SpO2 89-94% on RA    Follow Up Recommendations  Home health PT;Supervision/Assistance - 24 hour (will require SNF if 24/7 support not available)     Equipment Recommendations  3in1 (PT)    Recommendations for Other Services       Precautions / Restrictions Precautions Precautions: Fall;Other (comment) Precaution Comments: Legally blind Restrictions Weight Bearing Restrictions: No    Mobility  Bed Mobility               General bed mobility comments: In recliner upon arrival  Transfers Overall transfer level: Needs assistance Equipment used: Rolling [place] (2 wheeled) Transfers: Sit to/from Stand Sit to Stand: Min assist;Min guard         General transfer comment: Initial minA to power into standing from recliner, cues for hand placement with RW; additional stand from [facility] and 5x sit<>stand from recliner with close min guard for  balance  Ambulation/Gait Ambulation/Gait assistance: Min guard;Min assist Gait Distance (Feet): 40 Feet Assistive device: Rolling Sch[place] (2 wheeled) Gait Pattern/deviations: Step-through pattern;Decreased stride length;Trunk flexed Gait velocity: Decreased   General Gait Details: Slow gait with RW and close min guard for balance, intermittent minA to assist with RW navigation in unfamiliar environment; pt following directional cues (due to low vision) well around objects in room. Cues to maintain closer proximity to [employer]    Modified Rankin (Stroke Patients Only)       Balance Overall balance assessment: Needs assistance Sitting-balance support: No upper extremity supported;Feet supported Sitting balance-Leahy Scale: Fair Sitting balance - Comments: fair/good seated at EOB   Standing balance support: Bilateral upper extremity supported;During functional activity Standing balance-Leahy Scale: Fair Standing balance comment: Can static stand to perform pericare without UE support; static and dynamic stability improved with UE support                            Cognition Arousal/Alertness: Awake/alert Behavior During Therapy: WFL for tasks assessed/performed Overall Cognitive Status: Within Functional Limits for tasks assessed                                 General Comments: Following commands appropriately. Feel she is at baseline. Very sweet and asking appropriate questions. Limited by [facility]      Exercises      General Comments General comments (skin integrity, edema,  etc.): SpO2 89-94% on RA. Apparent hematuria when voiding in [facility] this session (NT to notify RN)      Pertinent Vitals/Pain Pain Assessment: Faces Faces Pain Scale: Hurts a little bit Pain Location: Generalized Pain Descriptors / Indicators: Tiring Pain Intervention(s): Monitored during session;Limited activity within patient's  tolerance    Home Living Family/patient expects to be discharged to:: Private residence Living Arrangements: Alone Available Help at Discharge: Family;Available 24 hours/day Type of Home: House Home Access: Stairs to enter Entrance Stairs-Rails: Right Home Layout: One level Home Equipment: [place] - 2 wheels;Wheelchair - manual;Cane - single point;Shower seat;[place] - 4 wheels;Toilet riser      Prior Function Level of Independence: Needs assistance  Gait / Transfers Assistance Needed: Uses RW for mobiltiy in home. w/c for out of home like MD apt ADL's / Homemaking Assistance Needed: Pt performs BADLs. Family assists with IADLs. Pt provided microwave meals and she receives meals on wheels.  Family cleans her home.     PT Goals (current goals can now be found in the care plan section) Acute Rehab PT Goals Patient Stated Goal: return home with family to assist PT Goal Formulation: With patient Time For Goal Achievement: 08/29/20 Potential to Achieve Goals: Good Progress towards PT goals: Progressing toward goals    Frequency    Min 3X/week      PT Plan Discharge plan needs to be updated    Co-evaluation              AM-PAC PT "6 Clicks" Mobility   Outcome Measure  Help needed turning from your back to your side while in a flat bed without using bedrails?: None Help needed moving from lying on your back to sitting on the side of a flat bed without using bedrails?: A Little Help needed moving to and from a bed to a chair (including a wheelchair)?: A Little Help needed standing up from a chair using your arms (e.g., wheelchair or bedside chair)?: A Little Help needed to walk in hospital room?: A Little Help needed climbing 3-5 steps with a railing? : A Lot 6 Click Score: 18    End of Session   Activity Tolerance: Patient tolerated treatment well Patient left: in chair;with call bell/phone within reach Nurse Communication: Mobility status PT Visit Diagnosis:  Unsteadiness on feet (R26.81);Other abnormalities of gait and mobility (R26.89);Muscle weakness (generalized) (M62.81)     Time: 8546-2703 PT Time Calculation (min) (ACUTE ONLY): 18 min  Charges:  $Therapeutic Activity: 8-22 mins                    Ina Homes, PT, DPT Acute Rehabilitation Services  Pager [phone] Office [phone]  [name] 08/16/2020, 2:21 PM

## 2020-08-16 NOTE — Progress Notes (Signed)
Initial Nutrition Assessment  RD working remotely.  DOCUMENTATION CODES:   Underweight  INTERVENTION:  - continue Ensure Enlive BID, each supplement provides 350 kcal and 20 grams of protein. - will order Magic Cup with lunch and dinner meals, each supplement provides 290 kcal and 9 grams of protein. - will complete NFPE when feasible.   NUTRITION DIAGNOSIS:   Increased nutrient needs related to acute illness, catabolic illness (COVID-19 infection) as evidenced by estimated needs.  GOAL:   Patient will meet greater than or equal to 90% of their needs  MONITOR:   PO intake, Supplement acceptance, Labs, Weight trends  REASON FOR ASSESSMENT:   Consult Assessment of nutrition requirement/status  ASSESSMENT:   86 year old female with medical history of HTN, R eye blindness, and tobacco use disorder (1/2 pack cigarettes/day). She presented to the ED due to SOB, cough productive of white sputum, and body aches x1 week. Also reported loss of taste and smell. She reported being vaccinated against COVID-19.  Diet changed from Regular to Soft yesterday at 1546; no intakes documented since admission. Ensure Enlive was ordered BID and patient has accepted this supplement 50% of the time offered.  Unable to reach patient by phone.   Weight on 10/13 was 88 lb and no other weight-related information is available in the chart. MST score of 0.   Per notes: - acute hypoxic respiratory failure 2/2 COVID-19 PNA - tested COVID positive on 10/13 - weakness, debility, deconditioning - patient is from home and as of today, plan at the time of d/c is to return home   Labs reviewed; Na: 134 mmol/l, BUN: 29 mg/dl, Ca: 8.3 mg/dl. Medications reviewed; 500 mg ascorbic acid/day, 1 tablet oscal-D/day, 1 tablet multivitamin with minerals/day, 100 mg IV remdesivir x2 doses 10/14 and x1 dose/day x4 days (10/15-10/18), 220 mg zinc sulfate/day.     NUTRITION - FOCUSED PHYSICAL EXAM:  unable to  complete at this time.   Diet Order:   Diet Order            DIET SOFT Room service appropriate? Yes; Fluid consistency: Thin  Diet effective now                 EDUCATION NEEDS:   No education needs have been identified at this time  Skin:  Skin Assessment: Skin Integrity Issues: Skin Integrity Issues:: Stage II Stage II: vertebral column  Last BM:  PTA/unknown  Height:   Ht Readings from Last 1 Encounters:  08/14/20 5\' 5"  (1.651 m)    Weight:   Wt Readings from Last 1 Encounters:  08/14/20 39.9 kg    Estimated Nutritional Needs:  Kcal:  1400-1600 kcal (35-40 kcal/kg) Protein:  60-70 grams Fluid:  >/= 1.7 L/day     08/16/20, MS, RD, LDN, CNSC Inpatient Clinical Dietitian RD pager # available in AMION  After hours/weekend pager # available in [facility]

## 2020-08-16 NOTE — Progress Notes (Signed)
PROGRESS NOTE    [name]  ZOX:[id number] DOB: 1925-11-23 DOA: 08/14/2020 PCP: Assunta FoundGolding, John, MD    Brief Narrative:  Dawn GamblerHazel C Johnston is a 86 year old female with past medical history notable for essential hypertension, right eye blindness, tobacco use disorder who presented to the ED with progressive shortness of breath.  Reports onset roughly 1 week prior with associated productive cough with white sputum and body aches.  She does endorse loss of taste and smell.  Patient reports was vaccinated for Covid-19.  Admits to current tobacco use with half pack per day.  In the ED, temperature nine 9.1, HR 78, RR 20, BP 135/69, SPO2 95% on 2 L nasal cannula.  Sodium 134, calcium 7.9, albumin 2.9.  LDH 127, ferritin 51, CRP 1.4, procalcitonin less than 0.10.  Troponin negative x2.  WBC count 6.1.  Chest x-ray with right lung infiltrate.  Covid-19 PCR positive.  Patient was started on remdesivir and Decadron.  Hospital service was consulted for further evaluation and treatment of acute hypoxic respiratory failure secondary to Covid-19 viral pneumonia.   Assessment & Plan:   Active Problems:   Pneumonia due to COVID-19 virus   Pressure injury of skin  Acute hypoxic respiratory failure secondary to acute Covid-19 viral pneumonia during the ongoing 2020 Covid 19 Pandemic - POA Patient presenting from home with progressive shortness of breath with productive cough and body aches.  Covid-19 PCR positive.  Chest x-ray with right middle lobe infiltrate.  Procalcitonin within normal limits.  Patient was noted be hypoxic on presentation. --COVID test: + PCR 08/14/2020 --CRP 1.4>1.0 --ddimer 0.59>0.60 --Remdesivir, plan 5-day course (Day #2/5) --Decadron 6 mg IV every 24 hours --prone for 2-3hrs every 12hrs if able --Continue supplemental oxygen, titrate to maintain SPO2 greater than 92%, on 2 L nasal cannula with SPO2 99% --Continue supportive care with albuterol MDI prn, vitamin C, zinc, Tylenol,  antitussives (benzonatate/ Mucinex/Tussionex) --Follow CBC, CMP, D-dimer, and CRP daily --Continue airborne/contact isolation precautions for 3 weeks from the day of diagnosis  The treatment plan and use of medications and known side effects were discussed with patient/family. Some of the medications used are based on case reports/anecdotal data.  All other medications being used in the management of COVID-19 based on limited study data.  Complete risks and long-term side effects are unknown, however in the best clinical judgment they seem to be of some benefit.  Patient wanted to proceed with treatment options provided.  Essential hypertension BP 131/62 this morning, fairly well controlled. --Continue diltiazem 180 mg p.o. daily  Tobacco use disorder Continues with current tobacco use, 1/2 ppd.  Counseled on need for complete cessation.  Right eye blindness --Supportive care  Moderate protein calorie malnutrition Body mass index is 14.64 kg/m. --nutrition consult  Weakness/debility/deconditioning PT/OT following, appreciate assistance.  Patient declining SNF placement and wishes to return home.  Family arranging 24-hour care.   DVT prophylaxis: Heparin Code Status: DNR Family Communication: Updated patient extensively at bedside  Disposition Plan:  Status is: Inpatient  Remains inpatient appropriate because:Ongoing diagnostic testing needed not appropriate for outpatient work up, Unsafe d/c plan, IV treatments appropriate due to intensity of illness or inability to take PO and Inpatient level of care appropriate due to severity of illness   Dispo: The patient is from: Home              Anticipated d/c is to: Home              Anticipated d/c date  is: 3 days              Patient currently is not medically stable to d/c.   Consultants:   none  Procedures:   none  Antimicrobials:   none   Subjective: Patient seen and examined bedside, resting comfortably in  bedside chair.  States feels slightly better today, continues with mild shortness of breath.  On 2 L nasal cannula (not on home O2).  Discussed with patient PT recommendations for SNF, patient declines and would like to return home with home health services.  No other questions or concerns at this time.  Denies headache, no fever/chills/night sweats, no nausea/vomiting/diarrhea, no chest pain, palpitations, no abdominal pain, no paresthesias.  No acute events overnight per nursing staff.  Objective: Vitals:   08/16/20 0454 08/16/20 0733 08/16/20 1056 08/16/20 1138  BP: 125/66 131/62  123/67  Pulse: 68 65  71  Resp: 20 20  16   Temp: 97.6 F (36.4 C) 98 F (36.7 C)  97.6 F (36.4 C)  TempSrc: Oral Oral  Oral  SpO2: 97% 99% 96% 94%  Weight:      Height:        Intake/Output Summary (Last 24 hours) at 08/16/2020 1349 Last data filed at 08/16/2020 0640 Gross per 24 hour  Intake 100 ml  Output 300 ml  Net -200 ml   Filed Weights   08/14/20 2240  Weight: 39.9 kg    Examination:  General exam: Appears calm and comfortable, elderly in appearance Respiratory system: Clear to auscultation. Respiratory effort normal.  On 2 L nasal cannula with SPO2 99%. Cardiovascular system: S1 & S2 heard, RRR. No JVD, murmurs, rubs, gallops or clicks. No pedal edema. Gastrointestinal system: Abdomen is nondistended, soft and nontender. No organomegaly or masses felt. Normal bowel sounds heard. Central nervous system: Alert and oriented. No focal neurological deficits. Extremities: Symmetric 5 x 5 power. Skin: No rashes, lesions or ulcers Psychiatry: Judgement and insight appear normal. Mood & affect appropriate.     Data Reviewed: I have personally reviewed following labs and imaging studies  CBC: Recent Labs  Lab 08/15/20 0042 08/16/20 0143  WBC 6.1 9.4  NEUTROABS 5.4 7.6  HGB 12.2 11.7*  HCT 38.0 36.2  MCV 77.2* 76.2*  PLT 388 427*   Basic Metabolic Panel: Recent Labs  Lab  08/15/20 0042 08/16/20 0143  NA 134* 134*  K 3.9 4.0  CL 103 105  CO2 22 21*  GLUCOSE 98 175*  BUN 23 29*  CREATININE 0.63 0.69  CALCIUM 7.9* 8.3*  MG  --  1.8   GFR: Estimated Creatinine Clearance: 28.3 mL/min (by C-G formula based on SCr of 0.69 mg/dL). Liver Function Tests: Recent Labs  Lab 08/15/20 0042 08/16/20 0143  AST 19 20  ALT 11 11  ALKPHOS 77 65  BILITOT 0.5 0.4  PROT 6.8 6.3*  ALBUMIN 2.9* 2.3*   No results for input(s): LIPASE, AMYLASE in the last 168 hours. Recent Labs  Lab 08/15/20 0042  AMMONIA 21   Coagulation Profile: No results for input(s): INR, PROTIME in the last 168 hours. Cardiac Enzymes: No results for input(s): CKTOTAL, CKMB, CKMBINDEX, TROPONINI in the last 168 hours. BNP (last 3 results) No results for input(s): PROBNP in the last 8760 hours. HbA1C: No results for input(s): HGBA1C in the last 72 hours. CBG: No results for input(s): GLUCAP in the last 168 hours. Lipid Profile: No results for input(s): CHOL, HDL, LDLCALC, TRIG, CHOLHDL, LDLDIRECT in the last 72 hours.  Thyroid Function Tests: No results for input(s): TSH, T4TOTAL, FREET4, T3FREE, THYROIDAB in the last 72 hours. Anemia Panel: Recent Labs    08/15/20 0042  FERRITIN 51   Sepsis Labs: Recent Labs  Lab 08/15/20 0042  PROCALCITON <0.10    Recent Results (from the past 240 hour(s))  Respiratory Panel by RT PCR (Flu A&B, Covid) - Nasopharyngeal Swab     Status: Abnormal   Collection Time: 08/14/20 11:45 PM   Specimen: Nasopharyngeal Swab  Result Value Ref Range Status   SARS Coronavirus 2 by RT PCR POSITIVE (A) NEGATIVE Final    Comment: RESULT CALLED TO, READ BACK BY AND VERIFIED WITH: C TURNER,RN@0119  08/15/20 MKELLY (NOTE) SARS-CoV-2 target nucleic acids are DETECTED.  SARS-CoV-2 RNA is generally detectable in upper respiratory specimens  during the acute phase of infection. Positive results are indicative of the presence of the identified virus, but do not  rule out bacterial infection or co-infection with other pathogens not detected by the test. Clinical correlation with patient history and other diagnostic information is necessary to determine patient infection status. The expected result is Negative.  Fact Sheet for Patients:  https://www.moore.com/  Fact Sheet for Healthcare Providers: https://www.young.biz/  This test is not yet approved or cleared by the [place] FDA and  has been authorized for detection and/or diagnosis of SARS-CoV-2 by FDA under an Emergency Use Authorization (EUA).  This EUA will remain in effect (meaning this test can be [place] ed) for the duration of  the COVID-19 declaration under Section 564(b)(1) of the Act, 21 U.S.C. section 360bbb-3(b)(1), unless the authorization is terminated or revoked sooner.      Influenza A by PCR NEGATIVE NEGATIVE Final   Influenza B by PCR NEGATIVE NEGATIVE Final    Comment: (NOTE) The Xpert Xpress SARS-CoV-2/FLU/RSV assay is intended as an aid in  the diagnosis of influenza from Nasopharyngeal swab specimens and  should not be used as a sole basis for treatment. Nasal washings and  aspirates are unacceptable for Xpert Xpress SARS-CoV-2/FLU/RSV  testing.  Fact Sheet for Patients: https://www.moore.com/  Fact Sheet for Healthcare Providers: https://www.young.biz/  This test is not yet approved or cleared by the [place] FDA and  has been authorized for detection and/or diagnosis of SARS-CoV-2 by  FDA under an Emergency Use Authorization (EUA). This EUA will remain  in effect (meaning this test can be used) for the duration of the  Covid-19 declaration under Section 564(b)(1) of the Act, 21  U.S.C. section 360bbb-3(b)(1), unless the authorization is  terminated or revoked. Performed at [facility], [address], [place], [place] [address]   Blood culture (routine x 2)     Status: None  (Preliminary result)   Collection Time: 08/15/20 12:32 AM   Specimen: BLOOD  Result Value Ref Range Status   Specimen Description BLOOD RIGHT ANTECUBITAL  Final   Special Requests   Final    BOTTLES DRAWN AEROBIC AND ANAEROBIC Blood Culture adequate volume   Culture   Final    NO GROWTH 1 DAY Performed at [facility], [address], [place], [place] [address]    Report Status PENDING  Incomplete  Blood culture (routine x 2)     Status: None (Preliminary result)   Collection Time: 08/15/20 12:42 AM   Specimen: BLOOD  Result Value Ref Range Status   Specimen Description BLOOD RIGHT ANTECUBITAL  Final   Special Requests   Final    BOTTLES DRAWN AEROBIC AND ANAEROBIC Blood Culture adequate volume  Culture   Final    NO GROWTH 1 DAY Performed at [facility], [address], [place], [place] [address]    Report Status PENDING  Incomplete  Urine culture     Status: Abnormal (Preliminary result)   Collection Time: 08/15/20  3:05 AM   Specimen: Urine, Clean Catch  Result Value Ref Range Status   Specimen Description   Final    URINE, CLEAN CATCH Performed at [facility], [address] [place], [place] [address]    Special Requests   Final    NONE Performed at [facility], [address], [place], [place] [address]    Culture >=100,000 COLONIES/mL ESCHERICHIA COLI (A)  Final   Report Status PENDING  Incomplete         Radiology Studies: DG Chest Portable 1 View  Result Date: 08/15/2020 CLINICAL DATA:  Cough, dyspnea EXAM: PORTABLE CHEST 1 VIEW COMPARISON:  02/27/2014 FINDINGS: Lung volumes are small and pulmonary insufflation has diminished since prior examination, though this is in part related to increasing thoracic kyphosis. There is superimposed asymmetric airspace infiltrate within the right mid lung zone, likely infectious in the appropriate clinical setting. No pneumothorax or pleural effusion. Cardiac size within normal limits. No acute bone abnormality. IMPRESSION:  Right mid lung zone focal pulmonary infiltrate, likely infectious in the acute setting. Electronically Signed   By: Helyn Numbers MD   On: 08/15/2020 00:37        Scheduled Meds: . albuterol  2 puff Inhalation Q6H  . vitamin C  500 mg Oral Daily  . aspirin EC  81 mg Oral Daily  . brimonidine  1 drop Both Eyes BID  . calcium-vitamin D  1 tablet Oral Daily  . dexamethasone (DECADRON) injection  6 mg Intravenous Q24H  . diltiazem  180 mg Oral Daily  . dorzolamide-timolol  1 drop Both Eyes BID  . feeding supplement  237 mL Oral BID BM  . heparin  5,000 Units Subcutaneous Q8H  . multivitamin with minerals  1 tablet Oral Daily  . neomycin-polymyxin b-dexamethasone  1 drop Left Eye BID  . pantoprazole  40 mg Oral Daily  . zinc sulfate  220 mg Oral Daily   Continuous Infusions: . remdesivir 100 mg in NS 100 mL 100 mg (08/16/20 0858)     LOS: 1 day    Time spent: 37 minutes spent on chart review, discussion with nursing staff, consultants, updating family and interview/physical exam; more than 50% of that time was spent in counseling and/or coordination of care.    Alvira Philips [place], DO [facility] Available via Epic secure chat 7am-7pm After these hours, please refer to coverage provider listed on [email or web address] 08/16/2020, 1:49 PM

## 2020-08-17 DIAGNOSIS — L8995 Pressure ulcer of unspecified site, unstageable: Secondary | ICD-10-CM | POA: Diagnosis not present

## 2020-08-17 DIAGNOSIS — U071 COVID-19: Secondary | ICD-10-CM | POA: Diagnosis not present

## 2020-08-17 DIAGNOSIS — J1282 Pneumonia due to Coronavirus disease 2019: Secondary | ICD-10-CM | POA: Diagnosis not present

## 2020-08-17 LAB — CBC WITH DIFFERENTIAL/PLATELET
Abs Immature Granulocytes: 0.08 10*3/uL — ABNORMAL HIGH (ref 0.00–0.07)
Basophils Absolute: 0 10*3/uL (ref 0.0–0.1)
Basophils Relative: 0 %
Eosinophils Absolute: 0 10*3/uL (ref 0.0–0.5)
Eosinophils Relative: 0 %
HCT: 35.6 % — ABNORMAL LOW (ref 36.0–46.0)
Hemoglobin: 11.7 g/dL — ABNORMAL LOW (ref 12.0–15.0)
Immature Granulocytes: 1 %
Lymphocytes Relative: 6 %
Lymphs Abs: 0.7 10*3/uL (ref 0.7–4.0)
MCH: 24.7 pg — ABNORMAL LOW (ref 26.0–34.0)
MCHC: 32.9 g/dL (ref 30.0–36.0)
MCV: 75.1 fL — ABNORMAL LOW (ref 80.0–100.0)
Monocytes Absolute: 0.6 10*3/uL (ref 0.1–1.0)
Monocytes Relative: 6 %
Neutro Abs: 9.6 10*3/uL — ABNORMAL HIGH (ref 1.7–7.7)
Neutrophils Relative %: 87 %
Platelets: 451 10*3/uL — ABNORMAL HIGH (ref 150–400)
RBC: 4.74 MIL/uL (ref 3.87–5.11)
RDW: 17 % — ABNORMAL HIGH (ref 11.5–15.5)
WBC: 11 10*3/uL — ABNORMAL HIGH (ref 4.0–10.5)
nRBC: 0 % (ref 0.0–0.2)

## 2020-08-17 LAB — URINE CULTURE: Culture: >=100000 — AB

## 2020-08-17 LAB — COMPREHENSIVE METABOLIC PANEL
ALT: 12 U/L (ref 0–44)
AST: 12 U/L — ABNORMAL LOW (ref 15–41)
Albumin: 2.5 g/dL — ABNORMAL LOW (ref 3.5–5.0)
Alkaline Phosphatase: 61 U/L (ref 38–126)
Anion gap: 8 (ref 5–15)
BUN: 39 mg/dL — ABNORMAL HIGH (ref 8–23)
CO2: 24 mmol/L (ref 22–32)
Calcium: 8.7 mg/dL — ABNORMAL LOW (ref 8.9–10.3)
Chloride: 107 mmol/L (ref 98–111)
Creatinine, Ser: 0.78 mg/dL (ref 0.44–1.00)
GFR, Estimated: >60 mL/min (ref >60)
Glucose, Bld: 114 mg/dL — ABNORMAL HIGH (ref 70–99)
Potassium: 4.1 mmol/L (ref 3.5–5.1)
Sodium: 139 mmol/L (ref 135–145)
Total Bilirubin: 0.6 mg/dL (ref 0.3–1.2)
Total Protein: 6.5 g/dL (ref 6.5–8.1)

## 2020-08-17 LAB — MAGNESIUM: Magnesium: 2 mg/dL (ref 1.7–2.4)

## 2020-08-17 LAB — BRAIN NATRIURETIC PEPTIDE: B Natriuretic Peptide: 173.1 pg/mL — ABNORMAL HIGH (ref 0.0–100.0)

## 2020-08-17 LAB — D-DIMER, QUANTITATIVE: D-Dimer, Quant: 0.5 ug/mL-FEU (ref 0.00–0.50)

## 2020-08-17 LAB — C-REACTIVE PROTEIN: CRP: 0.6 mg/dL (ref <1.0)

## 2020-08-17 MED ORDER — SODIUM CHLORIDE 0.9 % IV SOLN
1.0000 g | INTRAVENOUS | Status: AC
  Administered 2020-08-17 – 2020-08-19 (×3): 1 g via INTRAVENOUS
  Filled 2020-08-17 (×3): qty 10

## 2020-08-17 NOTE — Progress Notes (Signed)
PROGRESS NOTE    Dawn GamblerHazel C Johnston  WJX:[id number] DOB: January 24, 1926 DOA: 08/14/2020 PCP: Assunta FoundGolding, John, MD    Brief Narrative:  [name] is a 86 year old female with past medical history notable for essential hypertension, right eye blindness, tobacco use disorder who presented to the ED with progressive shortness of breath.  Reports onset roughly 1 week prior with associated productive cough with white sputum and body aches.  She does endorse loss of taste and smell.  Patient reports was vaccinated for Covid-19.  Admits to current tobacco use with half pack per day.  In the ED, temperature nine 9.1, HR 78, RR 20, BP 135/69, SPO2 95% on 2 L nasal cannula.  Sodium 134, calcium 7.9, albumin 2.9.  LDH 127, ferritin 51, CRP 1.4, procalcitonin less than 0.10.  Troponin negative x2.  WBC count 6.1.  Chest x-ray with right lung infiltrate.  Covid-19 PCR positive.  Patient was started on remdesivir and Decadron.  Hospital service was consulted for further evaluation and treatment of acute hypoxic respiratory failure secondary to Covid-19 viral pneumonia.   Assessment & Plan:   Active Problems:   Pneumonia due to COVID-19 virus   Pressure injury of skin  Acute hypoxic respiratory failure secondary to acute Covid-19 viral pneumonia during the ongoing 2020 Covid 19 Pandemic - POA Patient presenting from home with progressive shortness of breath with productive cough and body aches.  Covid-19 PCR positive.  Chest x-ray with right middle lobe infiltrate.  Procalcitonin within normal limits.  Patient was noted be hypoxic on presentation. --COVID test: + PCR 08/14/2020 --CRP 1.4>1.0>0.6 --ddimer 0.59>0.60>0.50 --Remdesivir, plan 5-day course (Day #3/5) --Decadron 6 mg IV every 24 hours --prone for 2-3hrs every 12hrs if able --Continue supplemental oxygen, titrate to maintain SPO2 greater than 92%, on 2 L nasal cannula with SPO2 97% --Continue supportive care with albuterol MDI prn, vitamin C, zinc,  Tylenol, antitussives (benzonatate/ Mucinex/Tussionex) --Follow CBC, CMP, D-dimer, and CRP daily --Continue airborne/contact isolation precautions for 3 weeks from the day of diagnosis --Home O2 ambulatory screen today  The treatment plan and use of medications and known side effects were discussed with patient/family. Some of the medications used are based on case reports/anecdotal data.  All other medications being used in the management of COVID-19 based on limited study data.  Complete risks and long-term side effects are unknown, however in the best clinical judgment they seem to be of some benefit.  Patient wanted to proceed with treatment options provided.  Essential hypertension BP 121/56 this morning, well controlled. --Continue diltiazem 180 mg p.o. daily  Tobacco use disorder Continues with current tobacco use, 1/2 ppd.  Counseled on need for complete cessation.  Right eye blindness --Supportive care  Moderate protein calorie malnutrition Body mass index is 14.64 kg/m. --nutrition consult  Weakness/debility/deconditioning PT/OT following, appreciate assistance.  Patient declining SNF placement and wishes to return home.  Family arranging 24-hour care.   DVT prophylaxis: Heparin Code Status: DNR Family Communication: Updated patient extensively at bedside  Disposition Plan:  Status is: Inpatient  Remains inpatient appropriate because:Ongoing diagnostic testing needed not appropriate for outpatient work up, Unsafe d/c plan, IV treatments appropriate due to intensity of illness or inability to take PO and Inpatient level of care appropriate due to severity of illness   Dispo: The patient is from: Home              Anticipated d/c is to: Home with home health likely 10/18  Anticipated d/c date is: 2 days              Patient currently is not medically stable to d/c.   Consultants:   none  Procedures:   none  Antimicrobials:    none   Subjective: Patient seen and examined bedside, resting comfortably in bedside chair.  Sleeping, but easily arousable.  Dyspnea improving.  Continues on 2L nasal cannula with SPO2 97%.  No other questions or concerns at this time.  Denies headache, no fever/chills/night sweats, no nausea/vomiting/diarrhea, no chest pain, palpitations, no abdominal pain, no paresthesias.  No acute events overnight per nursing staff.  Objective: Vitals:   08/16/20 2000 08/17/20 0400 08/17/20 0800 08/17/20 1200  BP: (!) 139/56 (!) 121/51 (!) 135/52 (!) 135/56  Pulse: 84 76 76   Resp: 20 18  19   Temp: 97.8 F (36.6 C) 97.6 F (36.4 C)    TempSrc: Oral Oral    SpO2: 97%  92% 93%  Weight:      Height:        Intake/Output Summary (Last 24 hours) at 08/17/2020 1305 Last data filed at 08/17/2020 0000 Gross per 24 hour  Intake 160 ml  Output 250 ml  Net -90 ml   Filed Weights   08/14/20 2240  Weight: 39.9 kg    Examination:  General exam: Appears calm and comfortable, elderly in appearance Respiratory system: Clear to auscultation. Respiratory effort normal.  On 2 L nasal cannula with SPO2 97%. Cardiovascular system: S1 & S2 heard, RRR. No JVD, murmurs, rubs, gallops or clicks. No pedal edema. Gastrointestinal system: Abdomen is nondistended, soft and nontender. No organomegaly or masses felt. Normal bowel sounds heard. Central nervous system: Alert and oriented. No focal neurological deficits. Extremities: Symmetric 5 x 5 power. Skin: No rashes, lesions or ulcers Psychiatry: Judgement and insight appear normal. Mood & affect appropriate.     Data Reviewed: I have personally reviewed following labs and imaging studies  CBC: Recent Labs  Lab 08/15/20 0042 08/16/20 0143 08/17/20 0500  WBC 6.1 9.4 11.0*  NEUTROABS 5.4 7.6 9.6*  HGB 12.2 11.7* 11.7*  HCT 38.0 36.2 35.6*  MCV 77.2* 76.2* 75.1*  PLT 388 427* 451*   Basic Metabolic Panel: Recent Labs  Lab 08/15/20 0042  08/16/20 0143 08/17/20 0500  NA 134* 134* 139  K 3.9 4.0 4.1  CL 103 105 107  CO2 22 21* 24  GLUCOSE 98 175* 114*  BUN 23 29* 39*  CREATININE 0.63 0.69 0.78  CALCIUM 7.9* 8.3* 8.7*  MG  --  1.8 2.0   GFR: Estimated Creatinine Clearance: 28.3 mL/min (by C-G formula based on SCr of 0.78 mg/dL). Liver Function Tests: Recent Labs  Lab 08/15/20 0042 08/16/20 0143 08/17/20 0500  AST 19 20 12*  ALT 11 11 12   ALKPHOS 77 65 61  BILITOT 0.5 0.4 0.6  PROT 6.8 6.3* 6.5  ALBUMIN 2.9* 2.3* 2.5*   No results for input(s): LIPASE, AMYLASE in the last 168 hours. Recent Labs  Lab 08/15/20 0042  AMMONIA 21   Coagulation Profile: No results for input(s): INR, PROTIME in the last 168 hours. Cardiac Enzymes: No results for input(s): CKTOTAL, CKMB, CKMBINDEX, TROPONINI in the last 168 hours. BNP (last 3 results) No results for input(s): PROBNP in the last 8760 hours. HbA1C: No results for input(s): HGBA1C in the last 72 hours. CBG: No results for input(s): GLUCAP in the last 168 hours. Lipid Profile: No results for input(s): CHOL, HDL, LDLCALC, TRIG, CHOLHDL,  LDLDIRECT in the last 72 hours. Thyroid Function Tests: No results for input(s): TSH, T4TOTAL, FREET4, T3FREE, THYROIDAB in the last 72 hours. Anemia Panel: Recent Labs    08/15/20 0042  FERRITIN 51   Sepsis Labs: Recent Labs  Lab 08/15/20 0042  PROCALCITON <0.10    Recent Results (from the past 240 hour(s))  Respiratory Panel by RT PCR (Flu A&B, Covid) - Nasopharyngeal Swab     Status: Abnormal   Collection Time: 08/14/20 11:45 PM   Specimen: Nasopharyngeal Swab  Result Value Ref Range Status   SARS Coronavirus 2 by RT PCR POSITIVE (A) NEGATIVE Final    Comment: RESULT CALLED TO, READ BACK BY AND VERIFIED WITH: C TURNER,RN@[id number]  08/15/20 MKELLY (NOTE) SARS-CoV-2 target nucleic acids are DETECTED.  SARS-CoV-2 RNA is generally detectable in upper respiratory specimens  during the acute phase of infection. Positive  results are indicative of the presence of the identified virus, but do not rule out bacterial infection or co-infection with other pathogens not detected by the test. Clinical correlation with patient history and other diagnostic information is necessary to determine patient infection status. The expected result is Negative.  Fact Sheet for Patients:  https://www.moore.com/  Fact Sheet for Healthcare Providers: https://www.young.biz/  This test is not yet approved or cleared by the [place] FDA and  has been authorized for detection and/or diagnosis of SARS-CoV-2 by FDA under an Emergency Use Authorization (EUA).  This EUA will remain in effect (meaning this test can be [place] ed) for the duration of  the COVID-19 declaration under Section 564(b)(1) of the Act, 21 U.S.C. section 360bbb-3(b)(1), unless the authorization is terminated or revoked sooner.      Influenza A by PCR NEGATIVE NEGATIVE Final   Influenza B by PCR NEGATIVE NEGATIVE Final    Comment: (NOTE) The Xpert Xpress SARS-CoV-2/FLU/RSV assay is intended as an aid in  the diagnosis of influenza from Nasopharyngeal swab specimens and  should not be used as a sole basis for treatment. Nasal washings and  aspirates are unacceptable for Xpert Xpress SARS-CoV-2/FLU/RSV  testing.  Fact Sheet for Patients: https://www.moore.com/  Fact Sheet for Healthcare Providers: https://www.young.biz/  This test is not yet approved or cleared by the [place] FDA and  has been authorized for detection and/or diagnosis of SARS-CoV-2 by  FDA under an Emergency Use Authorization (EUA). This EUA will remain  in effect (meaning this test can be used) for the duration of the  Covid-19 declaration under Section 564(b)(1) of the Act, 21  U.S.C. section 360bbb-3(b)(1), unless the authorization is  terminated or revoked. Performed at [facility], [address] [place], [place] [address]   Blood culture (routine x 2)     Status: None (Preliminary result)   Collection Time: 08/15/20 12:32 AM   Specimen: BLOOD  Result Value Ref Range Status   Specimen Description BLOOD RIGHT ANTECUBITAL  Final   Special Requests   Final    BOTTLES DRAWN AEROBIC AND ANAEROBIC Blood Culture adequate volume   Culture   Final    NO GROWTH 2 DAYS Performed at [facility], [address] [place], [place] [address]    Report Status PENDING  Incomplete  Blood culture (routine x 2)     Status: None (Preliminary result)   Collection Time: 08/15/20 12:42 AM   Specimen: BLOOD  Result Value Ref Range Status   Specimen Description BLOOD RIGHT ANTECUBITAL  Final   Special Requests   Final    BOTTLES DRAWN AEROBIC AND  ANAEROBIC Blood Culture adequate volume   Culture   Final    NO GROWTH 2 DAYS Performed at [facility], [address], [place], [place] [address]    Report Status PENDING  Incomplete  Urine culture     Status: Abnormal   Collection Time: 08/15/20  3:05 AM   Specimen: Urine, Clean Catch  Result Value Ref Range Status   Specimen Description   Final    URINE, CLEAN CATCH Performed at [facility], [address], [place], [place] [address]    Special Requests   Final    NONE Performed at [facility], [address] [place], [place] [address]    Culture >=100,000 COLONIES/mL ESCHERICHIA COLI (A)  Final   Report Status 08/17/2020 FINAL  Final   Organism ID, Bacteria ESCHERICHIA COLI (A)  Final      Susceptibility   Escherichia coli - MIC*    AMPICILLIN <=2 SENSITIVE Sensitive     CEFAZOLIN <=4 SENSITIVE Sensitive     CEFTRIAXONE <=0.25 SENSITIVE Sensitive     CIPROFLOXACIN <=0.25 SENSITIVE Sensitive     GENTAMICIN <=1 SENSITIVE Sensitive     IMIPENEM <=0.25 SENSITIVE Sensitive     NITROFURANTOIN <=16 SENSITIVE Sensitive     TRIMETH/SULFA <=20 SENSITIVE Sensitive     AMPICILLIN/SULBACTAM <=2 SENSITIVE Sensitive     PIP/TAZO <=4 SENSITIVE  Sensitive     * >=100,000 COLONIES/mL ESCHERICHIA COLI         Radiology Studies: No results found.      Scheduled Meds: . albuterol  2 puff Inhalation Q6H  . vitamin C  500 mg Oral Daily  . aspirin EC  81 mg Oral Daily  . brimonidine  1 drop Both Eyes BID  . calcium-vitamin D  1 tablet Oral Daily  . dexamethasone (DECADRON) injection  6 mg Intravenous Q24H  . diltiazem  180 mg Oral Daily  . dorzolamide-timolol  1 drop Both Eyes BID  . feeding supplement  237 mL Oral BID BM  . heparin  5,000 Units Subcutaneous Q8H  . multivitamin with minerals  1 tablet Oral Daily  . neomycin-polymyxin b-dexamethasone  1 drop Left Eye BID  . pantoprazole  40 mg Oral Daily  . zinc sulfate  220 mg Oral Daily   Continuous Infusions: . cefTRIAXone (ROCEPHIN)  IV 1 g (08/17/20 0845)  . remdesivir 100 mg in NS 100 mL 100 mg (08/17/20 0856)     LOS: 2 days    Time spent: 36 minutes spent on chart review, discussion with nursing staff, consultants, updating family and interview/physical exam; more than 50% of that time was spent in counseling and/or coordination of care.    Alvira Philips [place], DO [facility] Available via Epic secure chat 7am-7pm After these hours, please refer to coverage provider listed on [email or web address] 08/17/2020, 1:05 PM

## 2020-08-17 NOTE — Progress Notes (Signed)
NP notified of blood in urine ,stat labs ordered . Will continue to monitor.

## 2020-08-17 NOTE — Progress Notes (Signed)
Patients presents with dark red bloody urine after the use of bedpan. No distress noted. MD notified . Will continue to monitor.

## 2020-08-17 NOTE — Progress Notes (Signed)
   08/17/20 2030  Assess: MEWS Score  BP (!) 159/76  Pulse Rate 73  ECG Heart Rate 76  Resp (!) 26  Level of Consciousness Alert  SpO2 92 %  O2 Device Room Air  Assess: MEWS Score  MEWS Temp 0  MEWS Systolic 0  MEWS Pulse 0  MEWS RR 2  MEWS LOC 0  MEWS Score 2  MEWS Score Color Yellow  Assess: if the MEWS score is Yellow or Red  Were vital signs taken at a resting state? Yes  Focused Assessment No change from prior assessment  Early Detection of Sepsis Score *See Row Information* Medium  MEWS guidelines implemented *See Row Information* No, vital signs rechecked

## 2020-08-18 DIAGNOSIS — L8995 Pressure ulcer of unspecified site, unstageable: Secondary | ICD-10-CM | POA: Diagnosis not present

## 2020-08-18 DIAGNOSIS — U071 COVID-19: Secondary | ICD-10-CM | POA: Diagnosis not present

## 2020-08-18 DIAGNOSIS — J1282 Pneumonia due to Coronavirus disease 2019: Secondary | ICD-10-CM | POA: Diagnosis not present

## 2020-08-18 LAB — COMPREHENSIVE METABOLIC PANEL
ALT: 19 U/L (ref 0–44)
AST: 34 U/L (ref 15–41)
Albumin: 2.6 g/dL — ABNORMAL LOW (ref 3.5–5.0)
Alkaline Phosphatase: 63 U/L (ref 38–126)
Anion gap: 7 (ref 5–15)
BUN: 35 mg/dL — ABNORMAL HIGH (ref 8–23)
CO2: 23 mmol/L (ref 22–32)
Calcium: 8.6 mg/dL — ABNORMAL LOW (ref 8.9–10.3)
Chloride: 107 mmol/L (ref 98–111)
Creatinine, Ser: 0.68 mg/dL (ref 0.44–1.00)
GFR, Estimated: >60 mL/min (ref >60)
Glucose, Bld: 121 mg/dL — ABNORMAL HIGH (ref 70–99)
Potassium: 4 mmol/L (ref 3.5–5.1)
Sodium: 137 mmol/L (ref 135–145)
Total Bilirubin: 0.4 mg/dL (ref 0.3–1.2)
Total Protein: 6.5 g/dL (ref 6.5–8.1)

## 2020-08-18 LAB — CBC WITH DIFFERENTIAL/PLATELET
Abs Immature Granulocytes: 0.07 10*3/uL (ref 0.00–0.07)
Basophils Absolute: 0 10*3/uL (ref 0.0–0.1)
Basophils Relative: 0 %
Eosinophils Absolute: 0 10*3/uL (ref 0.0–0.5)
Eosinophils Relative: 0 %
HCT: 37.1 % (ref 36.0–46.0)
Hemoglobin: 12 g/dL (ref 12.0–15.0)
Immature Granulocytes: 1 %
Lymphocytes Relative: 4 %
Lymphs Abs: 0.5 10*3/uL — ABNORMAL LOW (ref 0.7–4.0)
MCH: 23.9 pg — ABNORMAL LOW (ref 26.0–34.0)
MCHC: 32.3 g/dL (ref 30.0–36.0)
MCV: 73.8 fL — ABNORMAL LOW (ref 80.0–100.0)
Monocytes Absolute: 0.6 10*3/uL (ref 0.1–1.0)
Monocytes Relative: 5 %
Neutro Abs: 10.5 10*3/uL — ABNORMAL HIGH (ref 1.7–7.7)
Neutrophils Relative %: 90 %
Platelets: 463 10*3/uL — ABNORMAL HIGH (ref 150–400)
RBC: 5.03 MIL/uL (ref 3.87–5.11)
RDW: 16.5 % — ABNORMAL HIGH (ref 11.5–15.5)
WBC: 11.7 10*3/uL — ABNORMAL HIGH (ref 4.0–10.5)
nRBC: 0 % (ref 0.0–0.2)

## 2020-08-18 LAB — C-REACTIVE PROTEIN: CRP: 0.6 mg/dL (ref <1.0)

## 2020-08-18 LAB — D-DIMER, QUANTITATIVE: D-Dimer, Quant: 0.86 ug/mL-FEU — ABNORMAL HIGH (ref 0.00–0.50)

## 2020-08-18 LAB — MAGNESIUM: Magnesium: 2 mg/dL (ref 1.7–2.4)

## 2020-08-18 NOTE — Progress Notes (Signed)
PROGRESS NOTE    [name]  DTO:[id number] DOB: [birth year] DOA: 08/14/2020 PCP: Assunta Found, MD    Brief Narrative:  [name] is a 86 year old female with past medical history notable for essential hypertension, right eye blindness, tobacco use disorder who presented to the ED with progressive shortness of breath.  Reports onset roughly 1 week prior with associated productive cough with white sputum and body aches.  She does endorse loss of taste and smell.  Patient reports was vaccinated for Covid-19.  Admits to current tobacco use with half pack per day.  In the ED, temperature nine 9.1, HR 78, RR 20, BP 135/69, SPO2 95% on 2 L nasal cannula.  Sodium 134, calcium 7.9, albumin 2.9.  LDH 127, ferritin 51, CRP 1.4, procalcitonin less than 0.10.  Troponin negative x2.  WBC count 6.1.  Chest x-ray with right lung infiltrate.  Covid-19 PCR positive.  Patient was started on remdesivir and Decadron.  Hospital service was consulted for further evaluation and treatment of acute hypoxic respiratory failure secondary to Covid-19 viral pneumonia.   Assessment & Plan:   Active Problems:   Pneumonia due to COVID-19 virus   Pressure injury of skin  Acute hypoxic respiratory failure secondary to acute Covid-19 viral pneumonia during the ongoing 2020 Covid 19 Pandemic - POA Patient presenting from home with progressive shortness of breath with productive cough and body aches.  Covid-19 PCR positive.  Chest x-ray with right middle lobe infiltrate.  Procalcitonin within normal limits.  Patient was noted be hypoxic on presentation. --COVID test: + PCR 08/14/2020 --CRP 1.4>1.0>0.6>0.6 --ddimer 0.59>0.60>0.50>0.86 --Remdesivir, plan 5-day course (Day #4/5) --Decadron 6 mg IV every 24 hours --prone for 2-3hrs every 12hrs if able --Continue supplemental oxygen, titrate to maintain SPO2 greater than 92%, on room air with SPO2 90%. --Continue supportive care with albuterol MDI prn, vitamin C, zinc,  Tylenol, antitussives (benzonatate/ Mucinex/Tussionex) --Follow CBC, CMP, D-dimer, and CRP daily --Continue airborne/contact isolation precautions for 3 weeks from the day of diagnosis --Home O2 ambulatory screen today  The treatment plan and use of medications and known side effects were discussed with patient/family. Some of the medications used are based on case reports/anecdotal data.  All other medications being used in the management of COVID-19 based on limited study data.  Complete risks and long-term side effects are unknown, however in the best clinical judgment they seem to be of some benefit.  Patient wanted to proceed with treatment options provided.  Essential hypertension BP 121/56 this morning, well controlled. --Continue diltiazem 180 mg p.o. daily  Tobacco use disorder Continues with current tobacco use, 1/2 ppd.  Counseled on need for complete cessation.  Right eye blindness --Supportive care  Moderate protein calorie malnutrition Body mass index is 14.64 kg/m. --nutrition consult  Weakness/debility/deconditioning PT/OT following, appreciate assistance.  Patient declining SNF placement and wishes to return home.  Family arranging 24-hour care.   DVT prophylaxis: SCDs Code Status: DNR Family Communication: Updated patient extensively at bedside; updated patient's daughter Purnell Shoemaker via telephone 08/17/2020  Disposition Plan:  Status is: Inpatient  Remains inpatient appropriate because:Ongoing diagnostic testing needed not appropriate for outpatient work up, Unsafe d/c plan, IV treatments appropriate due to intensity of illness or inability to take PO and Inpatient level of care appropriate due to severity of illness   Dispo: The patient is from: Home              Anticipated d/c is to: Home with home health likely 10/18  Anticipated d/c date is: 1 day              Patient currently is not medically stable to d/c.  Anticipate discharge home tomorrow with  home health as patient refusing SNF placement.   Consultants:   none  Procedures:   none  Antimicrobials:   none   Subjective: Patient seen and examined bedside, resting comfortably in bedside chair.  Now on room air, but SPO2 90%.  Discussed with patient once again recommendations of SNF placement given her functional decline, she adamantly continues to state she wants to go home, even go home today.  No other complaints or concerns at this time.  Will need to obtain ambulatory O2 saturation for concern of home O2 needs given SPO2 90% on room air at rest.  Denies headache, no fever/chills/night sweats, no nausea/vomiting/diarrhea, no chest pain, palpitations, no abdominal pain, no paresthesias.  No acute events overnight per nursing staff.  Objective: Vitals:   08/18/20 0400 08/18/20 0416 08/18/20 0600 08/18/20 0906  BP:  (!) 146/73  (!) 158/77  Pulse: 75 80 65 83  Resp: (!) 23 (!) 22 18 19   Temp:  97.9 F (36.6 C)  98 F (36.7 C)  TempSrc:  Oral  Oral  SpO2: (!) 88% (!) 86% 91% 92%  Weight:      Height:        Intake/Output Summary (Last 24 hours) at 08/18/2020 1238 Last data filed at 08/18/2020 0600 Gross per 24 hour  Intake 185.47 ml  Output 300 ml  Net -114.53 ml   Filed Weights   08/14/20 2240  Weight: 39.9 kg    Examination:  General exam: Appears calm and comfortable, elderly in appearance Respiratory system: Clear to auscultation. Respiratory effort normal.  On room air with SPO2 90%. Cardiovascular system: S1 & S2 heard, RRR. No JVD, murmurs, rubs, gallops or clicks. No pedal edema. Gastrointestinal system: Abdomen is nondistended, soft and nontender. No organomegaly or masses felt. Normal bowel sounds heard. Central nervous system: Alert and oriented. No focal neurological deficits. Extremities: Symmetric 5 x 5 power. Skin: No rashes, lesions or ulcers Psychiatry: Judgement and insight appear normal. Mood & affect appropriate.     Data Reviewed: I  have personally reviewed following labs and imaging studies  CBC: Recent Labs  Lab 08/15/20 0042 08/16/20 0143 08/17/20 0500 08/18/20 0410  WBC 6.1 9.4 11.0* 11.7*  NEUTROABS 5.4 7.6 9.6* 10.5*  HGB 12.2 11.7* 11.7* 12.0  HCT 38.0 36.2 35.6* 37.1  MCV 77.2* 76.2* 75.1* 73.8*  PLT 388 427* 451* 463*   Basic Metabolic Panel: Recent Labs  Lab 08/15/20 0042 08/16/20 0143 08/17/20 0500 08/18/20 0410  NA 134* 134* 139 137  K 3.9 4.0 4.1 4.0  CL 103 105 107 107  CO2 22 21* 24 23  GLUCOSE 98 175* 114* 121*  BUN 23 29* 39* 35*  CREATININE 0.63 0.69 0.78 0.68  CALCIUM 7.9* 8.3* 8.7* 8.6*  MG  --  1.8 2.0 2.0   GFR: Estimated Creatinine Clearance: 28.3 mL/min (by C-G formula based on SCr of 0.68 mg/dL). Liver Function Tests: Recent Labs  Lab 08/15/20 0042 08/16/20 0143 08/17/20 0500 08/18/20 0410  AST 19 20 12* 34  ALT 11 11 12 19   ALKPHOS 77 65 61 63  BILITOT 0.5 0.4 0.6 0.4  PROT 6.8 6.3* 6.5 6.5  ALBUMIN 2.9* 2.3* 2.5* 2.6*   No results for input(s): LIPASE, AMYLASE in the last 168 hours. Recent Labs  Lab 08/15/20  1443  AMMONIA 21   Coagulation Profile: No results for input(s): INR, PROTIME in the last 168 hours. Cardiac Enzymes: No results for input(s): CKTOTAL, CKMB, CKMBINDEX, TROPONINI in the last 168 hours. BNP (last 3 results) No results for input(s): PROBNP in the last 8760 hours. HbA1C: No results for input(s): HGBA1C in the last 72 hours. CBG: No results for input(s): GLUCAP in the last 168 hours. Lipid Profile: No results for input(s): CHOL, HDL, LDLCALC, TRIG, CHOLHDL, LDLDIRECT in the last 72 hours. Thyroid Function Tests: No results for input(s): TSH, T4TOTAL, FREET4, T3FREE, THYROIDAB in the last 72 hours. Anemia Panel: No results for input(s): VITAMINB12, FOLATE, FERRITIN, TIBC, IRON, RETICCTPCT in the last 72 hours. Sepsis Labs: Recent Labs  Lab 08/15/20 0042  PROCALCITON <0.10    Recent Results (from the past 240 hour(s))    Respiratory Panel by RT PCR (Flu A&B, Covid) - Nasopharyngeal Swab     Status: Abnormal   Collection Time: 08/14/20 11:45 PM   Specimen: Nasopharyngeal Swab  Result Value Ref Range Status   SARS Coronavirus 2 by RT PCR POSITIVE (A) NEGATIVE Final    Comment: RESULT CALLED TO, READ BACK BY AND VERIFIED WITH: C TURNER,RN@[id number]  08/15/20 MKELLY (NOTE) SARS-CoV-2 target nucleic acids are DETECTED.  SARS-CoV-2 RNA is generally detectable in upper respiratory specimens  during the acute phase of infection. Positive results are indicative of the presence of the identified virus, but do not rule out bacterial infection or co-infection with other pathogens not detected by the test. Clinical correlation with patient history and other diagnostic information is necessary to determine patient infection status. The expected result is Negative.  Fact Sheet for Patients:  https://www.moore.com/  Fact Sheet for Healthcare Providers: https://www.young.biz/  This test is not yet approved or cleared by the [place] FDA and  has been authorized for detection and/or diagnosis of SARS-CoV-2 by FDA under an Emergency Use Authorization (EUA).  This EUA will remain in effect (meaning this test can be [place] ed) for the duration of  the COVID-19 declaration under Section 564(b)(1) of the Act, 21 U.S.C. section 360bbb-3(b)(1), unless the authorization is terminated or revoked sooner.      Influenza A by PCR NEGATIVE NEGATIVE Final   Influenza B by PCR NEGATIVE NEGATIVE Final    Comment: (NOTE) The Xpert Xpress SARS-CoV-2/FLU/RSV assay is intended as an aid in  the diagnosis of influenza from Nasopharyngeal swab specimens and  should not be used as a sole basis for treatment. Nasal washings and  aspirates are unacceptable for Xpert Xpress SARS-CoV-2/FLU/RSV  testing.  Fact Sheet for Patients: https://www.moore.com/  Fact Sheet for Healthcare  Providers: https://www.young.biz/  This test is not yet approved or cleared by the [place] FDA and  has been authorized for detection and/or diagnosis of SARS-CoV-2 by  FDA under an Emergency Use Authorization (EUA). This EUA will remain  in effect (meaning this test can be used) for the duration of the  Covid-19 declaration under Section 564(b)(1) of the Act, 21  U.S.C. section 360bbb-3(b)(1), unless the authorization is  terminated or revoked. Performed at [facility], [address], [place], [place] [address]   Blood culture (routine x 2)     Status: None (Preliminary result)   Collection Time: 08/15/20 12:32 AM   Specimen: BLOOD  Result Value Ref Range Status   Specimen Description BLOOD RIGHT ANTECUBITAL  Final   Special Requests   Final    BOTTLES DRAWN AEROBIC AND ANAEROBIC Blood Culture adequate volume  Culture   Final    NO GROWTH 2 DAYS Performed at [facility], [address] [place], [place] [address]    Report Status PENDING  Incomplete  Blood culture (routine x 2)     Status: None (Preliminary result)   Collection Time: 08/15/20 12:42 AM   Specimen: BLOOD  Result Value Ref Range Status   Specimen Description BLOOD RIGHT ANTECUBITAL  Final   Special Requests   Final    BOTTLES DRAWN AEROBIC AND ANAEROBIC Blood Culture adequate volume   Culture   Final    NO GROWTH 2 DAYS Performed at [facility], [address] [place], [place] [address]    Report Status PENDING  Incomplete  Urine culture     Status: Abnormal   Collection Time: 08/15/20  3:05 AM   Specimen: Urine, Clean Catch  Result Value Ref Range Status   Specimen Description   Final    URINE, CLEAN CATCH Performed at [facility], [address] [place], [place] [address]    Special Requests   Final    NONE Performed at [facility], [address] [place], [place] [address]    Culture >=100,000 COLONIES/mL ESCHERICHIA COLI (A)  Final   Report Status 08/17/2020 FINAL   Final   Organism ID, Bacteria ESCHERICHIA COLI (A)  Final      Susceptibility   Escherichia coli - MIC*    AMPICILLIN <=2 SENSITIVE Sensitive     CEFAZOLIN <=4 SENSITIVE Sensitive     CEFTRIAXONE <=0.25 SENSITIVE Sensitive     CIPROFLOXACIN <=0.25 SENSITIVE Sensitive     GENTAMICIN <=1 SENSITIVE Sensitive     IMIPENEM <=0.25 SENSITIVE Sensitive     NITROFURANTOIN <=16 SENSITIVE Sensitive     TRIMETH/SULFA <=20 SENSITIVE Sensitive     AMPICILLIN/SULBACTAM <=2 SENSITIVE Sensitive     PIP/TAZO <=4 SENSITIVE Sensitive     * >=100,000 COLONIES/mL ESCHERICHIA COLI         Radiology Studies: No results found.      Scheduled Meds: . albuterol  2 puff Inhalation Q6H  . vitamin C  500 mg Oral Daily  . aspirin EC  81 mg Oral Daily  . brimonidine  1 drop Both Eyes BID  . calcium-vitamin D  1 tablet Oral Daily  . dexamethasone (DECADRON) injection  6 mg Intravenous Q24H  . diltiazem  180 mg Oral Daily  . dorzolamide-timolol  1 drop Both Eyes BID  . feeding supplement  237 mL Oral BID BM  . heparin  5,000 Units Subcutaneous Q8H  . multivitamin with minerals  1 tablet Oral Daily  . neomycin-polymyxin b-dexamethasone  1 drop Left Eye BID  . pantoprazole  40 mg Oral Daily  . zinc sulfate  220 mg Oral Daily   Continuous Infusions: . cefTRIAXone (ROCEPHIN)  IV 1 g (08/18/20 0929)  . remdesivir 100 mg in NS 100 mL 100 mg (08/18/20 0928)     LOS: 3 days    Time spent: 35 minutes spent on chart review, discussion with nursing staff, consultants, updating family and interview/physical exam; more than 50% of that time was spent in counseling and/or coordination of care.    Alvira PhilipsEric J [place], DO [facility] Available via Epic secure chat 7am-7pm After these hours, please refer to coverage provider listed on [email or web address] 08/18/2020, 12:38 PM

## 2020-08-18 NOTE — Progress Notes (Signed)
   08/18/20 2000  Assess: MEWS Score  Pulse Rate 77  ECG Heart Rate 78  Resp (!) 24  SpO2 93 %  Assess: MEWS Score  MEWS Temp 0  MEWS Systolic 1  MEWS Pulse 0  MEWS RR 1  MEWS LOC 0  MEWS Score 2  MEWS Score Color Yellow  Assess: if the MEWS score is Yellow or Red  Were vital signs taken at a resting state? Yes  Focused Assessment No change from prior assessment  Early Detection of Sepsis Score *See Row Information* Medium  MEWS guidelines implemented *See Row Information* No, vital signs rechecked (Pt frequently tachypneic, will recheck RR)

## 2020-08-18 NOTE — Progress Notes (Signed)
SATURATION QUALIFICATIONS: (This note is used to comply with regulatory documentation for home oxygen)  Patient Saturations on Room Air at Rest =95%  Patient Saturations on Room Air while Ambulating = 82%  Patient Saturations on 2 Liters of oxygen while Ambulating = 99%  Please briefly explain why patient needs home oxygen: Patient O2 stable on room-air when sitting but drops to the low 80s when ambulating on room-air.

## 2020-08-19 DIAGNOSIS — L8992 Pressure ulcer of unspecified site, stage 2: Secondary | ICD-10-CM

## 2020-08-19 DIAGNOSIS — N390 Urinary tract infection, site not specified: Secondary | ICD-10-CM | POA: Diagnosis present

## 2020-08-19 DIAGNOSIS — U071 COVID-19: Secondary | ICD-10-CM | POA: Diagnosis not present

## 2020-08-19 DIAGNOSIS — J1282 Pneumonia due to Coronavirus disease 2019: Secondary | ICD-10-CM | POA: Diagnosis not present

## 2020-08-19 MED ORDER — ZINC SULFATE 220 (50 ZN) MG PO CAPS: 220.0000 mg | ORAL_CAPSULE | Freq: Every day | ORAL | 0 refills | Status: AC

## 2020-08-19 MED ORDER — ALBUTEROL SULFATE HFA 108 (90 BASE) MCG/ACT IN AERS: 2.0000 | INHALATION_SPRAY | Freq: Four times a day (QID) | RESPIRATORY_TRACT | 0 refills | Status: AC

## 2020-08-19 MED ORDER — ASCORBIC ACID 500 MG PO TABS: 500.0000 mg | ORAL_TABLET | Freq: Every day | ORAL | 0 refills | Status: AC

## 2020-08-19 MED ORDER — DEXAMETHASONE 6 MG PO TABS: 6.0000 mg | ORAL_TABLET | Freq: Every day | ORAL | 0 refills | Status: AC

## 2020-08-19 MED ORDER — GUAIFENESIN-DM 100-10 MG/5ML PO SYRP: 10.0000 mL | ORAL_SOLUTION | ORAL | 0 refills | Status: DC | PRN

## 2020-08-19 NOTE — Discharge Summary (Addendum)
Physician Discharge Summary  Dawn GamblerHazel C Petro ZOX:[id number] DOB: 12-10-1925 DOA: 08/14/2020  PCP: Assunta FoundGolding, John, MD  Admit date: 08/14/2020 Discharge date: 08/19/2020  Admitted From: Home; transfer from [facility] Disposition: Home  Recommendations for Outpatient Follow-up:  1. Follow up with PCP in 1-2 weeks 2. Continue Decadron 6 mg p.o. daily for 4 additional days to complete 10-day course 3. Continue zinc, vitamin C, albuterol MDI as needed  Home Health: PT/OT/RN Equipment/Devices: 3:1 bedside commode  Discharge Condition: Stable CODE STATUS: DNR Diet recommendation: Regular diet  History of present illness:  [name] is a 86 year old female with past medical history notable for essential hypertension, right eye blindness, tobacco use disorder who presented to the ED with progressive shortness of breath.  Reports onset roughly 1 week prior with associated productive cough with white sputum and body aches.  She does endorse loss of taste and smell.  Patient reports was vaccinated for Covid-19.  Admits to current tobacco use with half pack per day.  In the ED, temperature nine 9.1, HR 78, RR 20, BP 135/69, SPO2 95% on 2 L nasal cannula.  Sodium 134, calcium 7.9, albumin 2.9.  LDH 127, ferritin 51, CRP 1.4, procalcitonin less than 0.10.  Troponin negative x2.  WBC count 6.1.  Chest x-ray with right lung infiltrate.  Covid-19 PCR positive.  Patient was started on remdesivir and Decadron.  Hospital service was consulted for further evaluation and treatment of acute hypoxic respiratory failure secondary to Covid-19 viral pneumonia.  Hospital course:  Acute hypoxic respiratory failure secondary to acute Covid-19 viral pneumonia during the ongoing 2020 Covid 19 Pandemic - POA Patient presenting from home with progressive shortness of breath with productive cough and body aches.  Covid-19 PCR positive.  Chest x-ray with right middle lobe infiltrate.  Procalcitonin within  normal limits.  Patient was noted be hypoxic on presentation. COVID PCR positive 08/14/2020.  CRP 1.4 with a D-dimer 0.59 on presentation.  Patient completed 5-day course of remdesivir, will start IV steroids with Decadron and will transition to Decadron 6 mg p.o. daily to complete a 10-day course.  Patient continue with hypoxia while ambulating and will require 2 L nasal cannula on discharge.  Continue vitamin C, zinc, Tylenol as needed, albuterol MDI as needed and antitussives as needed after discharge.  With PCP in 7-10 days following hospitalization.  Recommend booster vaccination in 45 days if it has been greater than 6 months since her initial Covid-19 vaccination.  The treatment plan and use of medications and known side effects were discussed with patient/family. Some of the medications used are based on case reports/anecdotal data.  All other medications being used in the management of COVID-19 based on limited study data.  Complete risks and long-term side effects are unknown, however in the best clinical judgment they seem to be of some benefit.  Patient wanted to proceed with treatment options provided.  Urinary tract infection Urine culture positive for E. coli, pansensitive.  Completed 3-day course of IV ceftriaxone.  Essential hypertension Continue diltiazem 180 mg p.o. daily  Tobacco use disorder Continues with current tobacco use, 1/2 ppd.  Counseled on need for complete cessation.  Right eye blindness Supportive care  Moderate protein calorie malnutrition Body mass index is 14.64 kg/m.  Recommend dietary supplements 3 times daily  Pressure injury, vertebral spine stage II, present on admission Recommend continue monitoring following discharge, frequent offloading.  Weakness/debility/deconditioning Seen and followed by PT/OT during hospitalization; with recommendations of SNF placement.  Patient  declining SNF placement and wishes to return home.  Family arranging  24-hour care and will discharge with home health PT/OT/RN.  Discharge Diagnoses:  Active Problems:   Pneumonia due to COVID-19 virus   Pressure injury of skin    Discharge Instructions  Discharge Instructions    Call MD for:  difficulty breathing, headache or visual disturbances   Complete by: As directed    Call MD for:  persistant dizziness or light-headedness   Complete by: As directed    Call MD for:  persistant nausea and vomiting   Complete by: As directed    Call MD for:  severe uncontrolled pain   Complete by: As directed    Call MD for:  temperature >100.4   Complete by: As directed    Diet - low sodium heart healthy   Complete by: As directed    Increase activity slowly   Complete by: As directed    No wound care   Complete by: As directed      Allergies as of 08/19/2020      Reactions   Iohexol Itching, Swelling, Rash   pt allergic to prednisone she needs IV premeds   Azithromycin    Doxycycline    Gentamicin    Ibandronic Acid    Nitrofurantoin    Penicillins    Prednisone    Strawberry (diagnostic)    Sulfa Antibiotics    Travatan [travoprost]    Xalatan [latanoprost]       Medication List    STOP taking these medications   bacitracin ophthalmic ointment     TAKE these medications   albuterol (2.5 MG/3ML) 0.083% nebulizer solution Commonly known as: PROVENTIL Inhale 1 vial into the lungs 4 (four) times daily as needed. What changed: Another medication with the same name was added. Make sure you understand how and when to take each.   albuterol 108 (90 Base) MCG/ACT inhaler Commonly known as: VENTOLIN HFA Inhale 2 puffs into the lungs every 6 (six) hours. What changed: You were already taking a medication with the same name, and this prescription was added. Make sure you understand how and when to take each.   Alphagan P 0.1 % Soln Generic drug: brimonidine Place 1 drop into both eyes 2 (two) times daily.   ascorbic acid 500 MG  tablet Commonly known as: VITAMIN C Take 1 tablet (500 mg total) by mouth daily. Start taking on: August 20, 2020   aspirin EC 81 MG tablet Take 81 mg by mouth daily. Swallow whole.   Calcium 600-200 MG-UNIT tablet Take 1 tablet by mouth daily.   cyanocobalamin 1000 MCG/ML injection Commonly known as: (VITAMIN B-12) Inject 1,000 mcg into the muscle every 30 (thirty) days.   desloratadine 5 MG tablet Commonly known as: CLARINEX Take 5 mg by mouth daily as needed.   dexamethasone 6 MG tablet Commonly known as: DECADRON Take 1 tablet (6 mg total) by mouth daily for 4 days. Start taking on: August 20, 2020   diltiazem 180 MG 24 hr capsule Commonly known as: CARDIZEM CD Take 180 mg by mouth daily.   dorzolamide-timolol 22.3-6.8 MG/ML ophthalmic solution Commonly known as: COSOPT Place 1 drop into both eyes 2 (two) times daily.   furosemide 20 MG tablet Commonly known as: LASIX Take 20 mg by mouth daily as needed.   guaiFENesin-dextromethorphan 100-10 MG/5ML syrup Commonly known as: ROBITUSSIN DM Take 10 mLs by mouth every 4 (four) hours as needed for cough.   ibuprofen 200 MG  tablet Commonly known as: ADVIL Take 400 mg by mouth every 6 (six) hours as needed.   ipratropium 0.02 % nebulizer solution Commonly known as: ATROVENT Inhale 1 vial into the lungs 4 (four) times daily as needed.   multivitamin-iron-minerals-folic acid chewable tablet Chew 1 tablet by mouth daily.   neomycin-polymyxin b-dexamethasone 3.5-10000-0.1 Susp Commonly known as: MAXITROL Place 1 drop into the left eye 2 (two) times daily.   omeprazole 20 MG capsule Commonly known as: PRILOSEC Take 20 mg by mouth daily.   sodium chloride 5 % ophthalmic solution Commonly known as: MURO 128 Place 1 drop into the left eye as needed for eye irritation.   Systane 0.4-0.3 % Soln Generic drug: Polyethyl Glycol-Propyl Glycol Apply 1 drop to eye every 6 (six) hours as needed.   zinc sulfate 220 (50  Zn) MG capsule Take 1 capsule (220 mg total) by mouth daily. Start taking on: August 20, 2020            Durable Medical Equipment  (From admission, onward)         Start     Ordered   08/19/20 0715  For home use only DME oxygen  Once       Question Answer Comment  Length of Need 12 Months   Mode or (Route) Nasal cannula   Liters per Minute 2   Frequency Continuous (stationary and portable oxygen unit needed)   Oxygen conserving device Yes   Oxygen delivery system Gas      08/19/20 0714   08/16/20 1441  For home use only DME 3 n 1  Once        08/16/20 1440          Follow-up Information    Assunta Found, MD. Schedule an appointment as soon as possible for a visit in 1 week(s).   Specialty: Family Medicine Contact information: [address] [place] [address] [phone]              Allergies  Allergen Reactions  . Iohexol Itching, Swelling and Rash    pt allergic to prednisone she needs IV premeds  . Azithromycin   . Doxycycline   . Gentamicin   . Ibandronic Acid   . Nitrofurantoin   . Penicillins   . Prednisone   . Strawberry (Diagnostic)   . Sulfa Antibiotics   . Travatan [Travoprost]   . Xalatan [Latanoprost]     Consultations:  None   Procedures/Studies: DG Chest Portable 1 View  Result Date: 08/15/2020 CLINICAL DATA:  Cough, dyspnea EXAM: PORTABLE CHEST 1 VIEW COMPARISON:  02/27/2014 FINDINGS: Lung volumes are small and pulmonary insufflation has diminished since prior examination, though this is in part related to increasing thoracic kyphosis. There is superimposed asymmetric airspace infiltrate within the right mid lung zone, likely infectious in the appropriate clinical setting. No pneumothorax or pleural effusion. Cardiac size within normal limits. No acute bone abnormality. IMPRESSION: Right mid lung zone focal pulmonary infiltrate, likely infectious in the acute setting. Electronically Signed   By: Helyn Numbers MD   On:  08/15/2020 00:37      Subjective: Patient seen and examined at bedside, resting comfortably.  States wishes to discharge home as soon as possible.  No other complaints or concerns at this time.  Understands to complete treatment with steroids on discharge.  Updated patient's daughter Purnell Shoemaker via telephone.  No other complaints or concerns at this time.  Denies headache, no dizziness, no chest pain, no palpitations, no shortness  of breath, no abdominal pain, no fever/chills/night sweats.  No acute events overnight per nursing staff.  Discharge Exam: Vitals:   08/19/20 0351 08/19/20 0900  BP: 127/68 (!) 144/78  Pulse: 75 72  Resp: 20 20  Temp: 97.9 F (36.6 C) 98.1 F (36.7 C)  SpO2: (!) 87% 92%   Vitals:   08/19/20 0000 08/19/20 0200 08/19/20 0351 08/19/20 0900  BP: (!) 123/42  127/68 (!) 144/78  Pulse: 76 66 75 72  Resp: (!) 21 20 20 20   Temp:   97.9 F (36.6 C) 98.1 F (36.7 C)  TempSrc:   Oral Oral  SpO2: 90% 92% (!) 87% 92%  Weight:      Height:        General: Pt is alert, awake, not in acute distress Cardiovascular: RRR, S1/S2 +, no rubs, no gallops Respiratory: CTA bilaterally, no wheezing, no rhonchi, on 2 L nasal cannula Abdominal: Soft, NT, ND, bowel sounds + Extremities: no edema, no cyanosis    The results of significant diagnostics from this hospitalization (including imaging, microbiology, ancillary and laboratory) are listed below for reference.     Microbiology: Recent Results (from the past 240 hour(s))  Respiratory Panel by RT PCR (Flu A&B, Covid) - Nasopharyngeal Swab     Status: Abnormal   Collection Time: 08/14/20 11:45 PM   Specimen: Nasopharyngeal Swab  Result Value Ref Range Status   SARS Coronavirus 2 by RT PCR POSITIVE (A) NEGATIVE Final    Comment: RESULT CALLED TO, READ BACK BY AND VERIFIED WITH: C TURNER,RN@0119  08/15/20 MKELLY (NOTE) SARS-CoV-2 target nucleic acids are DETECTED.  SARS-CoV-2 RNA is generally detectable in upper  respiratory specimens  during the acute phase of infection. Positive results are indicative of the presence of the identified virus, but do not rule out bacterial infection or co-infection with other pathogens not detected by the test. Clinical correlation with patient history and other diagnostic information is necessary to determine patient infection status. The expected result is Negative.  Fact Sheet for Patients:  https://www.moore.com/  Fact Sheet for Healthcare Providers: https://www.young.biz/  This test is not yet approved or cleared by the [place] FDA and  has been authorized for detection and/or diagnosis of SARS-CoV-2 by FDA under an Emergency Use Authorization (EUA).  This EUA will remain in effect (meaning this test can be [place] ed) for the duration of  the COVID-19 declaration under Section 564(b)(1) of the Act, 21 U.S.C. section 360bbb-3(b)(1), unless the authorization is terminated or revoked sooner.      Influenza A by PCR NEGATIVE NEGATIVE Final   Influenza B by PCR NEGATIVE NEGATIVE Final    Comment: (NOTE) The Xpert Xpress SARS-CoV-2/FLU/RSV assay is intended as an aid in  the diagnosis of influenza from Nasopharyngeal swab specimens and  should not be used as a sole basis for treatment. Nasal washings and  aspirates are unacceptable for Xpert Xpress SARS-CoV-2/FLU/RSV  testing.  Fact Sheet for Patients: https://www.moore.com/  Fact Sheet for Healthcare Providers: https://www.young.biz/  This test is not yet approved or cleared by the [place] FDA and  has been authorized for detection and/or diagnosis of SARS-CoV-2 by  FDA under an Emergency Use Authorization (EUA). This EUA will remain  in effect (meaning this test can be used) for the duration of the  Covid-19 declaration under Section 564(b)(1) of the Act, 21  U.S.C. section 360bbb-3(b)(1), unless the authorization  is  terminated or revoked. Performed at [facility], [address], [place], [place] [address]  Blood culture (routine x 2)     Status: None (Preliminary result)   Collection Time: 08/15/20 12:32 AM   Specimen: BLOOD  Result Value Ref Range Status   Specimen Description BLOOD RIGHT ANTECUBITAL  Final   Special Requests   Final    BOTTLES DRAWN AEROBIC AND ANAEROBIC Blood Culture adequate volume   Culture   Final    NO GROWTH 4 DAYS Performed at [facility], [address] [place], [place] [address]    Report Status PENDING  Incomplete  Blood culture (routine x 2)     Status: None (Preliminary result)   Collection Time: 08/15/20 12:42 AM   Specimen: BLOOD  Result Value Ref Range Status   Specimen Description BLOOD RIGHT ANTECUBITAL  Final   Special Requests   Final    BOTTLES DRAWN AEROBIC AND ANAEROBIC Blood Culture adequate volume   Culture   Final    NO GROWTH 4 DAYS Performed at [facility], [address], [place], [place] [address]    Report Status PENDING  Incomplete  Urine culture     Status: Abnormal   Collection Time: 08/15/20  3:05 AM   Specimen: Urine, Clean Catch  Result Value Ref Range Status   Specimen Description   Final    URINE, CLEAN CATCH Performed at [facility], [address], [place], [place] [address]    Special Requests   Final    NONE Performed at [facility], [address] [place], [place] [address]    Culture >=100,000 COLONIES/mL ESCHERICHIA COLI (A)  Final   Report Status 08/17/2020 FINAL  Final   Organism ID, Bacteria ESCHERICHIA COLI (A)  Final      Susceptibility   Escherichia coli - MIC*    AMPICILLIN <=2 SENSITIVE Sensitive     CEFAZOLIN <=4 SENSITIVE Sensitive     CEFTRIAXONE <=0.25 SENSITIVE Sensitive     CIPROFLOXACIN <=0.25 SENSITIVE Sensitive     GENTAMICIN <=1 SENSITIVE Sensitive     IMIPENEM <=0.25 SENSITIVE Sensitive     NITROFURANTOIN <=16 SENSITIVE Sensitive     TRIMETH/SULFA <=20 SENSITIVE Sensitive      AMPICILLIN/SULBACTAM <=2 SENSITIVE Sensitive     PIP/TAZO <=4 SENSITIVE Sensitive     * >=100,000 COLONIES/mL ESCHERICHIA COLI     Labs: BNP (last 3 results) Recent Labs    08/16/20 0143 08/17/20 0500  BNP 206.9* 173.1*   Basic Metabolic Panel: Recent Labs  Lab 08/15/20 0042 08/16/20 0143 08/17/20 0500 08/18/20 0410  NA 134* 134* 139 137  K 3.9 4.0 4.1 4.0  CL 103 105 107 107  CO2 22 21* 24 23  GLUCOSE 98 175* 114* 121*  BUN 23 29* 39* 35*  CREATININE 0.63 0.69 0.78 0.68  CALCIUM 7.9* 8.3* 8.7* 8.6*  MG  --  1.8 2.0 2.0   Liver Function Tests: Recent Labs  Lab 08/15/20 0042 08/16/20 0143 08/17/20 0500 08/18/20 0410  AST 19 20 12* 34  ALT ALKPHOS 77 65 61 63  BILITOT 0.5 0.4 0.6 0.4  PROT 6.8 6.3* 6.5 6.5  ALBUMIN 2.9* 2.3* 2.5* 2.6*   No results for input(s): LIPASE, AMYLASE in the last 168 hours. Recent Labs  Lab 08/15/20 0042  AMMONIA 21   CBC: Recent Labs  Lab 08/15/20 0042 08/16/20 0143 08/17/20 0500 08/18/20 0410  WBC 6.1 9.4 11.0* 11.7*  NEUTROABS 5.4 7.6 9.6* 10.5*  HGB 12.2 11.7* 11.7* 12.0  HCT 38.0 36.2 35.6* 37.1  MCV 77.2* 76.2*  75.1* 73.8*  PLT 388 427* 451* 463*   Cardiac Enzymes: No results for input(s): CKTOTAL, CKMB, CKMBINDEX, TROPONINI in the last 168 hours. BNP: Invalid input(s): POCBNP CBG: No results for input(s): GLUCAP in the last 168 hours. D-Dimer Recent Labs    08/17/20 0500 08/18/20 0410  DDIMER 0.50 0.86*   Hgb A1c No results for input(s): HGBA1C in the last 72 hours. Lipid Profile No results for input(s): CHOL, HDL, LDLCALC, TRIG, CHOLHDL, LDLDIRECT in the last 72 hours. Thyroid function studies No results for input(s): TSH, T4TOTAL, T3FREE, THYROIDAB in the last 72 hours.  Invalid input(s): FREET3 Anemia work up No results for input(s): VITAMINB12, FOLATE, FERRITIN, TIBC, IRON, RETICCTPCT in the last 72 hours. Urinalysis    Component Value Date/Time   COLORURINE YELLOW 08/15/2020 0000    APPEARANCEUR CLOUDY (A) 08/15/2020 0000   LABSPEC 1.010 08/15/2020 0000   PHURINE 6.0 08/15/2020 0000   GLUCOSEU NEGATIVE 08/15/2020 0000   HGBUR MODERATE (A) 08/15/2020 0000   BILIRUBINUR NEGATIVE 08/15/2020 0000   KETONESUR NEGATIVE 08/15/2020 0000   PROTEINUR 100 (A) 08/15/2020 0000   NITRITE POSITIVE (A) 08/15/2020 0000   LEUKOCYTESUR LARGE (A) 08/15/2020 0000   Sepsis Labs Invalid input(s): PROCALCITONIN,  WBC,  LACTICIDVEN Microbiology Recent Results (from the past 240 hour(s))  Respiratory Panel by RT PCR (Flu A&B, Covid) - Nasopharyngeal Swab     Status: Abnormal   Collection Time: 08/14/20 11:45 PM   Specimen: Nasopharyngeal Swab  Result Value Ref Range Status   SARS Coronavirus 2 by RT PCR POSITIVE (A) NEGATIVE Final    Comment: RESULT CALLED TO, READ BACK BY AND VERIFIED WITH: C TURNER,RN@0119  08/15/20 MKELLY (NOTE) SARS-CoV-2 target nucleic acids are DETECTED.  SARS-CoV-2 RNA is generally detectable in upper respiratory specimens  during the acute phase of infection. Positive results are indicative of the presence of the identified virus, but do not rule out bacterial infection or co-infection with other pathogens not detected by the test. Clinical correlation with patient history and other diagnostic information is necessary to determine patient infection status. The expected result is Negative.  Fact Sheet for Patients:  https://www.moore.com/  Fact Sheet for Healthcare Providers: https://www.young.biz/  This test is not yet approved or cleared by the [place] FDA and  has been authorized for detection and/or diagnosis of SARS-CoV-2 by FDA under an Emergency Use Authorization (EUA).  This EUA will remain in effect (meaning this test can be [place] ed) for the duration of  the COVID-19 declaration under Section 564(b)(1) of the Act, 21 U.S.C. section 360bbb-3(b)(1), unless the authorization is terminated or revoked  sooner.      Influenza A by PCR NEGATIVE NEGATIVE Final   Influenza B by PCR NEGATIVE NEGATIVE Final    Comment: (NOTE) The Xpert Xpress SARS-CoV-2/FLU/RSV assay is intended as an aid in  the diagnosis of influenza from Nasopharyngeal swab specimens and  should not be used as a sole basis for treatment. Nasal washings and  aspirates are unacceptable for Xpert Xpress SARS-CoV-2/FLU/RSV  testing.  Fact Sheet for Patients: https://www.moore.com/  Fact Sheet for Healthcare Providers: https://www.young.biz/  This test is not yet approved or cleared by the [place] FDA and  has been authorized for detection and/or diagnosis of SARS-CoV-2 by  FDA under an Emergency Use Authorization (EUA). This EUA will remain  in effect (meaning this test can be used) for the duration of the  Covid-19 declaration under Section 564(b)(1) of the Act, 21  U.S.C. section 360bbb-3(b)(1), unless the authorization  is  terminated or revoked. Performed at [facility], [address], [place], [place] [address]   Blood culture (routine x 2)     Status: None (Preliminary result)   Collection Time: 08/15/20 12:32 AM   Specimen: BLOOD  Result Value Ref Range Status   Specimen Description BLOOD RIGHT ANTECUBITAL  Final   Special Requests   Final    BOTTLES DRAWN AEROBIC AND ANAEROBIC Blood Culture adequate volume   Culture   Final    NO GROWTH 4 DAYS Performed at [facility], [address], [place], [place] [address]    Report Status PENDING  Incomplete  Blood culture (routine x 2)     Status: None (Preliminary result)   Collection Time: 08/15/20 12:42 AM   Specimen: BLOOD  Result Value Ref Range Status   Specimen Description BLOOD RIGHT ANTECUBITAL  Final   Special Requests   Final    BOTTLES DRAWN AEROBIC AND ANAEROBIC Blood Culture adequate volume   Culture   Final    NO GROWTH 4 DAYS Performed at [facility], [address] [place], [place] [address]     Report Status PENDING  Incomplete  Urine culture     Status: Abnormal   Collection Time: 08/15/20  3:05 AM   Specimen: Urine, Clean Catch  Result Value Ref Range Status   Specimen Description   Final    URINE, CLEAN CATCH Performed at [facility], [address] [place], [place] [address]    Special Requests   Final    NONE Performed at [facility], [address] [place], [place] [address]    Culture >=100,000 COLONIES/mL ESCHERICHIA COLI (A)  Final   Report Status 08/17/2020 FINAL  Final   Organism ID, Bacteria ESCHERICHIA COLI (A)  Final      Susceptibility   Escherichia coli - MIC*    AMPICILLIN <=2 SENSITIVE Sensitive     CEFAZOLIN <=4 SENSITIVE Sensitive     CEFTRIAXONE <=0.25 SENSITIVE Sensitive     CIPROFLOXACIN <=0.25 SENSITIVE Sensitive     GENTAMICIN <=1 SENSITIVE Sensitive     IMIPENEM <=0.25 SENSITIVE Sensitive     NITROFURANTOIN <=16 SENSITIVE Sensitive     TRIMETH/SULFA <=20 SENSITIVE Sensitive     AMPICILLIN/SULBACTAM <=2 SENSITIVE Sensitive     PIP/TAZO <=4 SENSITIVE Sensitive     * >=100,000 COLONIES/mL ESCHERICHIA COLI     Time coordinating discharge: Over 30 minutes  SIGNED:   Alvira Philips [place], DO  [facility] 08/19/2020, 10:22 AM

## 2020-08-19 NOTE — Care Management (Cosign Needed)
    Durable Medical Equipment  (From admission, onward)         Start     Ordered   08/19/20 0715  For home use only DME oxygen  Once       Question Answer Comment  Length of Need 12 Months   Mode or (Route) Nasal cannula   Liters per Minute 2   Frequency Continuous (stationary and portable oxygen unit needed)   Oxygen conserving device Yes   Oxygen delivery system Gas      08/19/20 0714   08/16/20 1441  For home use only DME 3 n 1  Once        08/16/20 1440

## 2020-08-19 NOTE — Care Management Important Message (Signed)
Important Message  Patient Details  Name: [name] MRN: [id number] Date of Birth: [birth year]   Medicare Important Message Given:  No Unable to get signature due to patient current medical condition so received verbal consent as per Mariel Kansky Patient Access.    Delsy Etzkorn 08/19/2020, 2:45 PM

## 2020-08-19 NOTE — Progress Notes (Signed)
D/C instructions given to patient daughter Purnell Shoemaker via phone call, all questions answered.

## 2020-08-19 NOTE — Care Management Important Message (Signed)
Important Message  Patient Details  Name: [name] MRN: [id number] Date of Birth: 07/02/1926   Medicare Important Message Given:  Yes - Important Message mailed due to current National Emergency  Verbal consent obtained due to current National Emergency  Relationship to patient: Self Contact Name: Yoneko Talerico Call Date: 08/19/20  Time: 1141 Phone: [phone] Outcome: Spoke with contact Important Message mailed to: Patient address on file    Orson Aloe 08/19/2020, 11:41 AM

## 2020-08-19 NOTE — Plan of Care (Signed)
Patient is medically ready for discharge - waiting for PTAR at this time. Would like to sit in the chair while waiting; chair alarm on and in place. Foam dsg changed to mid back. No apparent distress just waiting to be discharged. Will continue to monitor.

## 2020-08-19 NOTE — Progress Notes (Signed)
Patient requested for tele monitor and pulse ox to be off because she is ready to go home, all monitor off per patient request. Patient still waiting on PTAR.

## 2020-08-19 NOTE — Progress Notes (Signed)
Patient refused lab draws stating she's getting d/c today.

## 2020-08-19 NOTE — Progress Notes (Signed)
SATURATION QUALIFICATIONS: (This note is used to comply with regulatory documentation for home oxygen)  Patient Saturations on Room Air at Rest = 92%  Patient Saturations on Room Air while Ambulating = 84%  Patient Saturations on 2 Liters of oxygen while Ambulating = 96%  Please briefly explain why patient needs home oxygen: 

## 2020-08-19 NOTE — Discharge Instructions (Signed)
COVID-19: How to Protect Yourself and Others Know how it spreads  There is currently no vaccine to prevent coronavirus disease 2019 (COVID-19).  The best way to prevent illness is to avoid being exposed to this virus.  The virus is thought to spread mainly from person-to-person. ? Between people who are in close contact with one another (within about 6 feet). ? Through respiratory droplets produced when an infected person coughs, sneezes or talks. ? These droplets can land in the mouths or noses of people who are nearby or possibly be inhaled into the lungs. ? COVID-19 may be spread by people who are not showing symptoms. Everyone should Clean your hands often  Wash your hands often with soap and water for at least 20 seconds especially after you have been in a public place, or after blowing your nose, coughing, or sneezing.  If soap and water are not readily available, use a hand sanitizer that contains at least 60% alcohol. Cover all surfaces of your hands and rub them together until they feel dry.  Avoid touching your eyes, nose, and mouth with unwashed hands. Avoid close contact  Limit contact with others as much as possible.  Avoid close contact with people who are sick.  Put distance between yourself and other people. ? Remember that some people without symptoms may be able to spread virus. ? This is especially important for people who are at higher risk of getting very GainPain.com.cy Cover your mouth and nose with a mask when around others  You could spread COVID-19 to others even if you do not feel sick.  Everyone should wear a mask in public settings and when around people not living in their household, especially when social distancing is difficult to maintain. ? Masks should not be placed on young children under age 84, anyone who has trouble breathing, or is unconscious, incapacitated or otherwise  unable to remove the mask without assistance.  The mask is meant to protect other people in case you are infected.  Do NOT use a facemask meant for a Dietitian.  Continue to keep about 6 feet between yourself and others. The mask is not a substitute for social distancing. Cover coughs and sneezes  Always cover your mouth and nose with a tissue when you cough or sneeze or use the inside of your elbow.  Throw used tissues in the trash.  Immediately wash your hands with soap and water for at least 20 seconds. If soap and water are not readily available, clean your hands with a hand sanitizer that contains at least 60% alcohol. Clean and disinfect  Clean AND disinfect frequently touched surfaces daily. This includes tables, doorknobs, light switches, countertops, handles, desks, phones, keyboards, toilets, faucets, and sinks. [email or web address]  If surfaces are dirty, clean them: Use detergent or soap and water prior to disinfection.  Then, use a household disinfectant. You can see a list of EPA-registered household disinfectants here. [email or web address] 07/05/2019 This information is not intended to replace advice given to you by your health care provider. Make sure you discuss any questions you have with your health care provider. Document Revised: 07/13/2019 Document Reviewed: 05/11/2019 Elsevier Patient Education  2020 [employer].   COVID-19 COVID-19 is a respiratory infection that is caused by a virus called severe acute respiratory syndrome coronavirus 2 (SARS-CoV-2). The disease is also known as coronavirus disease or novel coronavirus. In some people, the virus may not cause any symptoms. In others, it may cause a serious infection. The infection  can get worse quickly and can lead to complications, such as:  Pneumonia, or infection of the lungs.  Acute respiratory distress syndrome or ARDS. This is a  condition in which fluid build-up in the lungs prevents the lungs from filling with air and passing oxygen into the blood.  Acute respiratory failure. This is a condition in which there is not enough oxygen passing from the lungs to the body or when carbon dioxide is not passing from the lungs out of the body.  Sepsis or septic shock. This is a serious bodily reaction to an infection.  Blood clotting problems.  Secondary infections due to bacteria or fungus.  Organ failure. This is when your body's organs stop working. The virus that causes COVID-19 is contagious. This means that it can spread from person to person through droplets from coughs and sneezes (respiratory secretions). What are the causes? This illness is caused by a virus. You may catch the virus by:  Breathing in droplets from an infected person. Droplets can be spread by a person breathing, speaking, singing, coughing, or sneezing.  Touching something, like a table or a doorknob, that was exposed to the virus (contaminated) and then touching your mouth, nose, or eyes. What increases the risk? Risk for infection You are more likely to be infected with this virus if you:  Are within 6 feet (2 meters) of a person with COVID-19.  Provide care for or live with a person who is infected with COVID-19.  Spend time in crowded indoor spaces or live in shared housing. Risk for serious illness You are more likely to become seriously ill from the virus if you:  Are 57 years of age or older. The higher your age, the more you are at risk for serious illness.  Live in a nursing home or long-term care facility.  Have cancer.  Have a long-term (chronic) disease such as: ? Chronic lung disease, including chronic obstructive pulmonary disease or asthma. ? A long-term disease that lowers your body's ability to fight infection (immunocompromised). ? Heart disease, including heart failure, a condition in which the arteries that lead to  the heart become narrow or blocked (coronary artery disease), a disease which makes the heart muscle thick, weak, or stiff (cardiomyopathy). ? Diabetes. ? Chronic kidney disease. ? Sickle cell disease, a condition in which red blood cells have an abnormal "sickle" shape. ? Liver disease.  Are obese. What are the signs or symptoms? Symptoms of this condition can range from mild to severe. Symptoms may appear any time from 2 to 14 days after being exposed to the virus. They include:  A fever or chills.  A cough.  Difficulty breathing.  Headaches, body aches, or muscle aches.  Runny or stuffy (congested) nose.  A sore throat.  New loss of taste or smell. Some people may also have stomach problems, such as nausea, vomiting, or diarrhea. Other people may not have any symptoms of COVID-19. How is this diagnosed? This condition may be diagnosed based on:  Your signs and symptoms, especially if: ? You live in an area with a COVID-19 outbreak. ? You recently traveled to or from an area where the virus is common. ? You provide care for or live with a person who was diagnosed with COVID-19. ? You were exposed to a person who was diagnosed with COVID-19.  A physical exam.  Lab tests, which may include: ? Taking a sample of fluid from the back of your nose and throat (nasopharyngeal  fluid), your nose, or your throat using a swab. ? A sample of mucus from your lungs (sputum). ? Blood tests.  Imaging tests, which may include, X-rays, CT scan, or ultrasound. How is this treated? At present, there is no medicine to treat COVID-19. Medicines that treat other diseases are being used on a trial basis to see if they are effective against COVID-19. Your health care provider will talk with you about ways to treat your symptoms. For most people, the infection is mild and can be managed at home with rest, fluids, and over-the-counter medicines. Treatment for a serious infection usually takes  places in a hospital intensive care unit (ICU). It may include one or more of the following treatments. These treatments are given until your symptoms improve.  Receiving fluids and medicines through an IV.  Supplemental oxygen. Extra oxygen is given through a tube in the nose, a face mask, or a hood.  Positioning you to lie on your stomach (prone position). This makes it easier for oxygen to get into the lungs.  Continuous positive airway pressure (CPAP) or bi-level positive airway pressure (BPAP) machine. This treatment uses mild air pressure to keep the airways open. A tube that is connected to a motor delivers oxygen to the body.  Ventilator. This treatment moves air into and out of the lungs by using a tube that is placed in your windpipe.  Tracheostomy. This is a procedure to create a hole in the neck so that a breathing tube can be inserted.  Extracorporeal membrane oxygenation (ECMO). This procedure gives the lungs a chance to recover by taking over the functions of the heart and lungs. It supplies oxygen to the body and removes carbon dioxide. Follow these instructions at home: Lifestyle  If you are sick, stay home except to get medical care. Your health care provider will tell you how long to stay home. Call your health care provider before you go for medical care.  Rest at home as told by your health care provider.  Do not use any products that contain nicotine or tobacco, such as cigarettes, e-cigarettes, and chewing tobacco. If you need help quitting, ask your health care provider.  Return to your normal activities as told by your health care provider. Ask your health care provider what activities are safe for you. General instructions  Take over-the-counter and prescription medicines only as told by your health care provider.  Drink enough fluid to keep your urine pale yellow.  Keep all follow-up visits as told by your health care provider. This is important. How is this  prevented?  There is no vaccine to help prevent COVID-19 infection. However, there are steps you can take to protect yourself and others from this virus. To protect yourself:   Do not travel to areas where COVID-19 is a risk. The areas where COVID-19 is reported change often. To identify high-risk areas and travel restrictions, check the CDC travel website: FatFares.com.br  If you live in, or must travel to, an area where COVID-19 is a risk, take precautions to avoid infection. ? Stay away from people who are sick. ? Wash your hands often with soap and water for 20 seconds. If soap and water are not available, use an alcohol-based hand sanitizer. ? Avoid touching your mouth, face, eyes, or nose. ? Avoid going out in public, follow guidance from your state and local health authorities. ? If you must go out in public, wear a cloth face covering or face mask. Make sure  your mask covers your nose and mouth. ? Avoid crowded indoor spaces. Stay at least 6 feet (2 meters) away from others. ? Disinfect objects and surfaces that are frequently touched every day. This may include:  Counters and tables.  Doorknobs and light switches.  Sinks and faucets.  Electronics, such as phones, remote controls, keyboards, computers, and tablets. To protect others: If you have symptoms of COVID-19, take steps to prevent the virus from spreading to others.  If you think you have a COVID-19 infection, contact your health care provider right away. Tell your health care team that you think you may have a COVID-19 infection.  Stay home. Leave your house only to seek medical care. Do not use public transport.  Do not travel while you are sick.  Wash your hands often with soap and water for 20 seconds. If soap and water are not available, use alcohol-based hand sanitizer.  Stay away from other members of your household. Let healthy household members care for children and pets, if possible. If you  have to care for children or pets, wash your hands often and wear a mask. If possible, stay in your own room, separate from others. Use a different bathroom.  Make sure that all people in your household wash their hands well and often.  Cough or sneeze into a tissue or your sleeve or elbow. Do not cough or sneeze into your hand or into the air.  Wear a cloth face covering or face mask. Make sure your mask covers your nose and mouth. Where to find more information  Centers for Disease Control and Prevention: PurpleGadgets.be  World Health Organization: https://www.castaneda.info/ Contact a health care provider if:  You live in or have traveled to an area where COVID-19 is a risk and you have symptoms of the infection.  You have had contact with someone who has COVID-19 and you have symptoms of the infection. Get help right away if:  You have trouble breathing.  You have pain or pressure in your chest.  You have confusion.  You have bluish lips and fingernails.  You have difficulty waking from sleep.  You have symptoms that get worse. These symptoms may represent a serious problem that is an emergency. Do not wait to see if the symptoms will go away. Get medical help right away. Call your local emergency services (911 in the [place] Do not drive yourself to the hospital. Let the emergency medical personnel know if you think you have COVID-19. Summary  COVID-19 is a respiratory infection that is caused by a virus. It is also known as coronavirus disease or novel coronavirus. It can cause serious infections, such as pneumonia, acute respiratory distress syndrome, acute respiratory failure, or sepsis.  The virus that causes COVID-19 is contagious. This means that it can spread from person to person through droplets from breathing, speaking, singing, coughing, or sneezing.  You are more likely to develop a serious illness if you are 76 years of age  or older, have a weak immune system, live in a nursing home, or have chronic disease.  There is no medicine to treat COVID-19. Your health care provider will talk with you about ways to treat your symptoms.  Take steps to protect yourself and others from infection. Wash your hands often and disinfect objects and surfaces that are frequently touched every day. Stay away from people who are sick and wear a mask if you are sick. This information is not intended to replace advice given to  you by your health care provider. Make sure you discuss any questions you have with your health care provider. Document Revised: 08/18/2019 Document Reviewed: 11/24/2018 Elsevier Patient Education  2020 Elsevier Inc.  10 Things You Can Do to Manage Your COVID-19 Symptoms at Home If you have possible or confirmed COVID-19: 1. Stay home from work and school. And stay away from other public places. If you must go out, avoid using any kind of public transportation, ridesharing, or taxis. 2. Monitor your symptoms carefully. If your symptoms get worse, call your healthcare provider immediately. 3. Get rest and stay hydrated. 4. If you have a medical appointment, call the healthcare provider ahead of time and tell them that you have or may have COVID-19. 5. For medical emergencies, call 911 and notify the dispatch personnel that you have or may have COVID-19. 6. Cover your cough and sneezes with a tissue or use the inside of your elbow. 7. Wash your hands often with soap and water for at least 20 seconds or clean your hands with an alcohol-based hand sanitizer that contains at least 60% alcohol. 8. As much as possible, stay in a specific room and away from other people in your home. Also, you should use a separate bathroom, if available. If you need to be around other people in or outside of the home, wear a mask. 9. Avoid sharing personal items with other people in your household, like dishes, towels, and  bedding. 10. Clean all surfaces that are touched often, like counters, tabletops, and doorknobs. Use household cleaning sprays or wipes according to the label instructions. SouthAmericaFlowers.co.uk 05/03/2019 This information is not intended to replace advice given to you by your health care provider. Make sure you discuss any questions you have with your health care provider. Document Revised: 10/05/2019 Document Reviewed: 10/05/2019 Elsevier Patient Education  2020 [employer].   COVID-19 Frequently Asked Questions COVID-19 (coronavirus disease) is an infection that is caused by a large family of viruses. Some viruses cause illness in people and others cause illness in animals like camels, cats, and bats. In some cases, the viruses that cause illness in animals can spread to humans. Where did the coronavirus come from? In December 2019, [place] told the [facility]) of several cases of lung disease (human respiratory illness). These cases were linked to an open seafood and livestock market in the city of [place]. The link to the seafood and livestock market suggests that the virus may have spread from animals to humans. However, since that first outbreak in December, the virus has also been shown to spread from person to person. What is the name of the disease and the virus? Disease name Early on, this disease was called novel coronavirus. This is because scientists determined that the disease was caused by a new (novel) respiratory virus. The World Health Organization [facility]) has now named the disease COVID-19, or coronavirus disease. Virus name The virus that causes the disease is called severe acute respiratory syndrome coronavirus 2 (SARS-CoV-2). More information on disease and virus naming World Health Organization [facility]): www.who.int/emergencies/diseases/novel-coronavirus-2019/technical-guidance/naming-the-coronavirus-disease-(covid-2019)-and-the-virus-that-causes-it Who is at  risk for complications from coronavirus disease? Some people may be at higher risk for complications from coronavirus disease. This includes older adults and people who have chronic diseases, such as heart disease, diabetes, and lung disease. If you are at higher risk for complications, take these extra precautions:  Stay home as much as possible.  Avoid social gatherings and travel.  Avoid close contact with others. Stay  at least 6 ft (2 m) away from others, if possible.  Wash your hands often with soap and water for at least 20 seconds.  Avoid touching your face, mouth, nose, or eyes.  Keep supplies on hand at home, such as food, medicine, and cleaning supplies.  If you must go out in public, wear a cloth face covering or face mask. Make sure your mask covers your nose and mouth. How does coronavirus disease spread? The virus that causes coronavirus disease spreads easily from person to person (is contagious). You may catch the virus by:  Breathing in droplets from an infected person. Droplets can be spread by a person breathing, speaking, singing, coughing, or sneezing.  Touching something, like a table or a doorknob, that was exposed to the virus (contaminated) and then touching your mouth, nose, or eyes. Can I get the virus from touching surfaces or objects? There is still a lot that we do not know about the virus that causes coronavirus disease. Scientists are basing a lot of information on what they know about similar viruses, such as:  Viruses cannot generally survive on surfaces for long. They need a human body (host) to survive.  It is more likely that the virus is spread by close contact with people who are sick (direct contact), such as through: ? Shaking hands or hugging. ? Breathing in respiratory droplets that travel through the air. Droplets can be spread by a person breathing, speaking, singing, coughing, or sneezing.  It is less likely that the virus is spread when a  person touches a surface or object that has the virus on it (indirect contact). The virus may be able to enter the body if the person touches a surface or object and then touches his or her face, eyes, nose, or mouth. Can a person spread the virus without having symptoms of the disease? It may be possible for the virus to spread before a person has symptoms of the disease, but this is most likely not the main way the virus is spreading. It is more likely for the virus to spread by being in close contact with people who are sick and breathing in the respiratory droplets spread by a person breathing, speaking, singing, coughing, or sneezing. What are the symptoms of coronavirus disease? Symptoms vary from person to person and can range from mild to severe. Symptoms may include:  Fever or chills.  Cough.  Difficulty breathing or feeling short of breath.  Headaches, body aches, or muscle aches.  Runny or stuffy (congested) nose.  Sore throat.  New loss of taste or smell.  Nausea, vomiting, or diarrhea. These symptoms can appear anywhere from 2 to 14 days after you have been exposed to the virus. Some people may not have any symptoms. If you develop symptoms, call your health care provider. People with severe symptoms may need hospital care. Should I be tested for this virus? Your health care provider will decide whether to test you based on your symptoms, history of exposure, and your risk factors. How does a health care provider test for this virus? Health care providers will collect samples to send for testing. Samples may include:  Taking a swab of fluid from the back of your nose and throat, your nose, or your throat.  Taking fluid from the lungs by having you cough up mucus (sputum) into a sterile cup.  Taking a blood sample. Is there a treatment or vaccine for this virus? Currently, there is no vaccine to  prevent coronavirus disease. Also, there are no medicines like antibiotics or  antivirals to treat the virus. A person who becomes sick is given supportive care, which means rest and fluids. A person may also relieve his or her symptoms by using over-the-counter medicines that treat sneezing, coughing, and runny nose. These are the same medicines that a person takes for the common cold. If you develop symptoms, call your health care provider. People with severe symptoms may need hospital care. What can I do to protect myself and my family from this virus?     You can protect yourself and your family by taking the same actions that you would take to prevent the spread of other viruses. Take the following actions:  Wash your hands often with soap and water for at least 20 seconds. If soap and water are not available, use alcohol-based hand sanitizer.  Avoid touching your face, mouth, nose, or eyes.  Cough or sneeze into a tissue, sleeve, or elbow. Do not cough or sneeze into your hand or the air. ? If you cough or sneeze into a tissue, throw it away immediately and wash your hands.  Disinfect objects and surfaces that you frequently touch every day.  Stay away from people who are sick.  Avoid going out in public, follow guidance from your state and local health authorities.  Avoid crowded indoor spaces. Stay at least 6 ft (2 m) away from others.  If you must go out in public, wear a cloth face covering or face mask. Make sure your mask covers your nose and mouth.  Stay home if you are sick, except to get medical care. Call your health care provider before you get medical care. Your health care provider will tell you how long to stay home.  Make sure your vaccines are up to date. Ask your health care provider what vaccines you need. What should I do if I need to travel? Follow travel recommendations from your local health authority, the CDC, and WHO. Travel information and advice  Centers for Disease Control and Prevention (CDC):  BodyEditor.hu  World Health Organization [facility]): ThirdIncome.ca Know the risks and take action to protect your health  You are at higher risk of getting coronavirus disease if you are traveling to areas with an outbreak or if you are exposed to travelers from areas with an outbreak.  Wash your hands often and practice good hygiene to lower the risk of catching or spreading the virus. What should I do if I am sick? General instructions to stop the spread of infection  Wash your hands often with soap and water for at least 20 seconds. If soap and water are not available, use alcohol-based hand sanitizer.  Cough or sneeze into a tissue, sleeve, or elbow. Do not cough or sneeze into your hand or the air.  If you cough or sneeze into a tissue, throw it away immediately and wash your hands.  Stay home unless you must get medical care. Call your health care provider or local health authority before you get medical care.  Avoid public areas. Do not take public transportation, if possible.  If you can, wear a mask if you must go out of the house or if you are in close contact with someone who is not sick. Make sure your mask covers your nose and mouth. Keep your home clean  Disinfect objects and surfaces that are frequently touched every day. This may include: ? Counters and tables. ? Doorknobs and light  switches. ? Sinks and faucets. ? Electronics such as phones, remote controls, keyboards, computers, and tablets.  Wash dishes in hot, soapy water or use a dishwasher. Air-dry your dishes.  Wash laundry in hot water. Prevent infecting other household members  Let healthy household members care for children and pets, if possible. If you have to care for children or pets, wash your hands often and wear a mask.  Sleep in a different bedroom or bed, if possible.  Do not share personal items, such  as razors, toothbrushes, deodorant, combs, brushes, towels, and washcloths. Where to find more information Centers for Disease Control and Prevention (CDC)  Information and news updates: CardRetirement.cz World Health Organization [facility])  Information and news updates: AffordableSalon.es  Coronavirus health topic: https://thompson-craig.com/  Questions and answers on COVID-19: kruiseway.com  Global tracker: who.sprinklr.com American Academy of Pediatrics (AAP)  Information for families: www.healthychildren.org/English/health-issues/conditions/chest-lungs/Pages/2019-Novel-Coronavirus.aspx The coronavirus situation is changing rapidly. Check your local health authority website or the CDC and [facility] websites for updates and news. When should I contact a health care provider?  Contact your health care provider if you have symptoms of an infection, such as fever or cough, and you: ? Have been near anyone who is known to have coronavirus disease. ? Have come into contact with a person who is suspected to have coronavirus disease. ? Have traveled to an area where there is an outbreak of COVID-19. When should I get emergency medical care?  Get help right away by calling your local emergency services (911 in the [place] if you have: ? Trouble breathing. ? Pain or pressure in your chest. ? Confusion. ? Blue-tinged lips and fingernails. ? Difficulty waking from sleep. ? Symptoms that get worse. Let the emergency medical personnel know if you think you have coronavirus disease. Summary  A new respiratory virus is spreading from person to person and causing COVID-19 (coronavirus disease).  The virus that causes COVID-19 appears to spread easily. It spreads from one person to another through droplets from breathing, speaking, singing, coughing, or sneezing.  Older adults and those with chronic  diseases are at higher risk of disease. If you are at higher risk for complications, take extra precautions.  There is currently no vaccine to prevent coronavirus disease. There are no medicines, such as antibiotics or antivirals, to treat the virus.  You can protect yourself and your family by washing your hands often, avoiding touching your face, and covering your coughs and sneezes. This information is not intended to replace advice given to you by your health care provider. Make sure you discuss any questions you have with your health care provider. Document Revised: 08/18/2019 Document Reviewed: 02/14/2019 Elsevier Patient Education  2020 Elsevier Inc.    Person Under Monitoring Name: [name]  Location: [address] [place] [address]   Infection Prevention Recommendations for Individuals Confirmed to have, or Being Evaluated for, 2019 Novel Coronavirus (COVID-19) Infection Who Receive Care at Home  Individuals who are confirmed to have, or are being evaluated for, COVID-19 should follow the prevention steps below until a healthcare provider or local or state health department says they can return to normal activities.  Stay home except to get medical care You should restrict activities outside your home, except for getting medical care. Do not go to work, school, or public areas, and do not use public transportation or taxis.  Call ahead before visiting your doctor Before your medical appointment, call the healthcare provider and tell them that you have, or  are being evaluated for, COVID-19 infection. This will help the healthcare provider's office take steps to keep other people from getting infected. Ask your healthcare provider to call the local or state health department.  Monitor your symptoms Seek prompt medical attention if your illness is worsening (e.g., difficulty breathing). Before going to your medical appointment, call the healthcare provider and tell  them that you have, or are being evaluated for, COVID-19 infection. Ask your healthcare provider to call the local or state health department.  Wear a facemask You should wear a facemask that covers your nose and mouth when you are in the same room with other people and when you visit a healthcare provider. People who live with or visit you should also wear a facemask while they are in the same room with you.  Separate yourself from other people in your home As much as possible, you should stay in a different room from other people in your home. Also, you should use a separate bathroom, if available.  Avoid sharing household items You should not share dishes, drinking glasses, cups, eating utensils, towels, bedding, or other items with other people in your home. After using these items, you should wash them thoroughly with soap and water.  Cover your coughs and sneezes Cover your mouth and nose with a tissue when you cough or sneeze, or you can cough or sneeze into your sleeve. Throw used tissues in a lined trash can, and immediately wash your hands with soap and water for at least 20 seconds or use an alcohol-based hand rub.  Wash your [employer] your hands often and thoroughly with soap and water for at least 20 seconds. You can use an alcohol-based hand sanitizer if soap and water are not available and if your hands are not visibly dirty. Avoid touching your eyes, nose, and mouth with unwashed hands.   Prevention Steps for Caregivers and Household Members of Individuals Confirmed to have, or Being Evaluated for, COVID-19 Infection Being Cared for in the Home  If you live with, or provide care at home for, a person confirmed to have, or being evaluated for, COVID-19 infection please follow these guidelines to prevent infection:  Follow healthcare provider's instructions Make sure that you understand and can help the patient follow any healthcare provider instructions for all  care.  Provide for the patient's basic needs You should help the patient with basic needs in the home and provide support for getting groceries, prescriptions, and other personal needs.  Monitor the patient's symptoms If they are getting sicker, call his or her medical provider and tell them that the patient has, or is being evaluated for, COVID-19 infection. This will help the healthcare provider's office take steps to keep other people from getting infected. Ask the healthcare provider to call the local or state health department.  Limit the number of people who have contact with the patient  If possible, have only one caregiver for the patient.  Other household members should stay in another home or place of residence. If this is not possible, they should stay  in another room, or be separated from the patient as much as possible. Use a separate bathroom, if available.  Restrict visitors who do not have an essential need to be in the home.  Keep older adults, very young children, and other sick people away from the patient Keep older adults, very young children, and those who have compromised immune systems or chronic health conditions away from the patient.  This includes people with chronic heart, lung, or kidney conditions, diabetes, and cancer.  Ensure good ventilation Make sure that shared spaces in the home have good air flow, such as from an air conditioner or an opened window, weather permitting.  Wash your hands often  Wash your hands often and thoroughly with soap and water for at least 20 seconds. You can use an alcohol based hand sanitizer if soap and water are not available and if your hands are not visibly dirty.  Avoid touching your eyes, nose, and mouth with unwashed hands.  Use disposable paper towels to dry your hands. If not available, use dedicated cloth towels and replace them when they become wet.  Wear a facemask and gloves  Wear a disposable facemask at  all times in the room and gloves when you touch or have contact with the patient's blood, body fluids, and/or secretions or excretions, such as sweat, saliva, sputum, nasal mucus, vomit, urine, or feces.  Ensure the mask fits over your nose and mouth tightly, and do not touch it during use.  Throw out disposable facemasks and gloves after using them. Do not reuse.  Wash your hands immediately after removing your facemask and gloves.  If your personal clothing becomes contaminated, carefully remove clothing and launder. Wash your hands after handling contaminated clothing.  Place all used disposable facemasks, gloves, and other waste in a lined container before disposing them with other household waste.  Remove gloves and wash your hands immediately after handling these items.  Do not share dishes, glasses, or other household items with the patient  Avoid sharing household items. You should not share dishes, drinking glasses, cups, eating utensils, towels, bedding, or other items with a patient who is confirmed to have, or being evaluated for, COVID-19 infection.  After the person uses these items, you should wash them thoroughly with soap and water.  Wash laundry thoroughly  Immediately remove and wash clothes or bedding that have blood, body fluids, and/or secretions or excretions, such as sweat, saliva, sputum, nasal mucus, vomit, urine, or feces, on them.  Wear gloves when handling laundry from the patient.  Read and follow directions on labels of laundry or clothing items and detergent. In general, wash and dry with the warmest temperatures recommended on the label.  Clean all areas the individual has used often  Clean all touchable surfaces, such as counters, tabletops, doorknobs, bathroom fixtures, toilets, phones, keyboards, tablets, and bedside tables, every day. Also, clean any surfaces that may have blood, body fluids, and/or secretions or excretions on them.  Wear gloves when  cleaning surfaces the patient has come in contact with.  Use a diluted bleach solution (e.g., dilute bleach with 1 part bleach and 10 parts water) or a household disinfectant with a label that says EPA-registered for coronaviruses. To make a bleach solution at home, add 1 tablespoon of bleach to 1 quart (4 cups) of water. For a larger supply, add  cup of bleach to 1 gallon (16 cups) of water.  Read labels of cleaning products and follow recommendations provided on product labels. Labels contain instructions for safe and effective use of the cleaning product including precautions you should take when applying the product, such as wearing gloves or eye protection and making sure you have good ventilation during use of the product.  Remove gloves and wash hands immediately after cleaning.  Monitor yourself for signs and symptoms of illness Caregivers and household members are considered close contacts, should monitor their health,  and will be asked to limit movement outside of the home to the extent possible. Follow the monitoring steps for close contacts listed on the symptom monitoring form.   ? If you have additional questions, contact your local health department or call the epidemiologist on call at [phone] (available 24/7). ? This guidance is subject to change. For the most up-to-date guidance from [facility], please refer to their website: YouBlogs.pl

## 2020-08-19 NOTE — TOC Initial Note (Addendum)
Transition of Care [facility]) - Initial/Assessment Note    Patient Details  Name: [name] MRN: [id number] Date of Birth: 1926/09/17  [facility] CM/SW Contact:    Janae Bridgeman, RN Phone Number: 08/19/2020, 11:50 AM  Clinical Narrative:                 Case management called and spoke with the patient's daughter, Purnell Shoemaker and Yisroel Ramming on the phone regarding transitions of care to home.  The patient will be discharged home by [facility] to the daughter, Lurena Nida address at [address]  [place], [place] [address] and will be cared for by the patient's daughter, Yisroel Ramming at [phone].  The patient's family was given choice regarding home health and dme services and the family did not have a request.  Case management called and spoke with Kandee Keen, CM with [employer] and home oxygen and 3:1 was ordered to be delivered to the home today in [place].  [facility] was requested by the family for patient's transportation home and this will be arranged once confirmation is made as to when the oxygen will arrive at the home.    I called [facility] and spoke with Kandee Keen and home health services were set up for PT, OT and home health aide.  The patient will be discharging home via [facility].  I will set up discharge to home once the patient's oxygen arrives at the home.  Will continue to follow for transition to home today.  08/19/2020 1500 - Spoke with [employer] and the patient's daughter - and home O2 and 3:1 were both delivered to the home.  [facility] was called and D/C packet was created and included patient's DNR.  The patient's daughter is aware that [facility] transportation was arranged.  Primary RN to teach discharge instructions to the daughter.  Expected Discharge Plan: Home w Home Health Services Barriers to Discharge: No Barriers Identified   Patient Goals and CMS Choice Patient states their goals for this hospitalization and ongoing recovery are:: Plans to discharge home by ambulance to her  daughter's home in [place], [place]. CMS Medicare.gov Compare Post Acute Care list provided to:: Patient Choice offered to / list presented to : Patient  Expected Discharge Plan and Services Expected Discharge Plan: Home w Home Health Services   Discharge Planning Services: CM Consult Post Acute Care Choice: Durable Medical Equipment, Home Health Living arrangements for the past 2 months: Single Family Home Expected Discharge Date: 08/19/20               DME Arranged: 3-N-1, Other see comment (Home oxygen) DME Agency: [employer] ([employer] will deliver oxygen and 3:1 to the home today.) Date DME Agency Contacted: 08/19/20 Time DME Agency Contacted: 1147 Representative spoke with at DME Agency: Jenness Corner, CM for [employer] dme agency HH Arranged: PT, OT, Nurse's Aide HH Agency: [facility] Date [facility] Agency Contacted: 08/19/20 Time HH Agency Contacted: 1149 Representative spoke with at [facility] Agency: Kandee Keen, CM with [facility]  Prior Living Arrangements/Services Living arrangements for the past 2 months: Single Family Home Lives with:: Self Patient language and need for interpreter reviewed:: Yes Do you feel safe going back to the place where you live?: Yes      Need for Family Participation in Patient Care: Yes (Comment) Care giver support system in place?: Yes (comment) Current home services: DME (currently has cane, rolling [place] and wheelchair at home) Criminal Activity/Legal Involvement Pertinent to Current Situation/Hospitalization: No - Comment as needed  Activities  of Daily Living   ADL Screening (condition at time of admission) Patient's cognitive ability adequate to safely complete daily activities?: Yes Is the patient deaf or have difficulty hearing?: Yes Does the patient have difficulty seeing, even when wearing glasses/contacts?: Yes Does the patient have difficulty concentrating, remembering, or making decisions?: No Patient able to express need for assistance with ADLs?:  No Does the patient have difficulty dressing or bathing?: No Independently performs ADLs?: Yes (appropriate for developmental age) Does the patient have difficulty walking or climbing stairs?: Yes Weakness of Legs: Both Weakness of Arms/Hands: None  Permission Sought/Granted Permission sought to share information with : Case Manager Permission granted to share information with : Yes, Verbal Permission Granted  Share Information with NAME: Particia Jasper and Yisroel Ramming, daughters  Permission granted to share info w AGENCY: HH and dme company  Permission granted to share info w Relationship: daughters     Emotional Assessment Appearance:: Appears stated age   Affect (typically observed): Pleasant Orientation: : Oriented to Self, Oriented to Place, Oriented to  Time, Oriented to Situation Alcohol / Substance Use: Tobacco Use Psych Involvement: No (comment)  Admission diagnosis:  Cough [R05.9] Acute cystitis without hematuria [N30.00] COVID [U07.1] Pneumonia due to COVID-19 virus [U07.1, J12.82] Patient Active Problem List   Diagnosis Date Noted  . Pressure injury of skin 08/16/2020  . Pneumonia due to COVID-19 virus 08/15/2020  . Acute cystitis without hematuria   . Cough    PCP:  Assunta Found, MD Pharmacy:   [facility] [place], [place] - [address] [place] [address] Phone: [phone] Fax: [phone]     Social Determinants of Health (SDOH) Interventions    Readmission Risk Interventions Readmission Risk Prevention Plan 08/19/2020  Post Dischage Appt Complete  Medication Screening Complete  Transportation Screening Complete  Some recent data might be hidden

## 2020-08-20 LAB — CULTURE, BLOOD (ROUTINE X 2)
Culture: NO GROWTH
Culture: NO GROWTH
Special Requests: ADEQUATE
Special Requests: ADEQUATE

## 2020-08-20 NOTE — Progress Notes (Signed)
Spoke with daughter, Rivka Barbara, and made her aware that the patient is on her way.

## 2020-08-20 NOTE — Progress Notes (Signed)
Patient discharged with [facility] via stretcher with belongings. Daughter will be notified to let her know that the patient is on her way.

## 2020-08-22 DIAGNOSIS — L89112 Pressure ulcer of right upper back, stage 2: Secondary | ICD-10-CM | POA: Diagnosis not present

## 2020-08-22 DIAGNOSIS — L89122 Pressure ulcer of left upper back, stage 2: Secondary | ICD-10-CM | POA: Diagnosis not present

## 2020-08-22 DIAGNOSIS — J1282 Pneumonia due to coronavirus disease 2019: Secondary | ICD-10-CM | POA: Diagnosis not present

## 2020-08-22 DIAGNOSIS — U071 COVID-19: Secondary | ICD-10-CM | POA: Diagnosis not present

## 2020-08-22 DIAGNOSIS — J9601 Acute respiratory failure with hypoxia: Secondary | ICD-10-CM | POA: Diagnosis not present

## 2020-08-26 DIAGNOSIS — J1282 Pneumonia due to coronavirus disease 2019: Secondary | ICD-10-CM | POA: Diagnosis not present

## 2020-08-26 DIAGNOSIS — M1991 Primary osteoarthritis, unspecified site: Secondary | ICD-10-CM | POA: Diagnosis not present

## 2020-08-26 DIAGNOSIS — J449 Chronic obstructive pulmonary disease, unspecified: Secondary | ICD-10-CM | POA: Diagnosis not present

## 2020-08-26 DIAGNOSIS — L89112 Pressure ulcer of right upper back, stage 2: Secondary | ICD-10-CM | POA: Diagnosis not present

## 2020-08-26 DIAGNOSIS — J9601 Acute respiratory failure with hypoxia: Secondary | ICD-10-CM | POA: Diagnosis not present

## 2020-08-26 DIAGNOSIS — L89122 Pressure ulcer of left upper back, stage 2: Secondary | ICD-10-CM | POA: Diagnosis not present

## 2020-08-26 DIAGNOSIS — U071 COVID-19: Secondary | ICD-10-CM | POA: Diagnosis not present

## 2020-08-26 DIAGNOSIS — R0902 Hypoxemia: Secondary | ICD-10-CM | POA: Diagnosis not present

## 2020-08-26 DIAGNOSIS — Z681 Body mass index (BMI) 19 or less, adult: Secondary | ICD-10-CM | POA: Diagnosis not present

## 2020-08-27 DIAGNOSIS — U071 COVID-19: Secondary | ICD-10-CM | POA: Diagnosis not present

## 2020-08-27 DIAGNOSIS — J1282 Pneumonia due to coronavirus disease 2019: Secondary | ICD-10-CM | POA: Diagnosis not present

## 2020-08-27 DIAGNOSIS — L89112 Pressure ulcer of right upper back, stage 2: Secondary | ICD-10-CM | POA: Diagnosis not present

## 2020-08-27 DIAGNOSIS — L89122 Pressure ulcer of left upper back, stage 2: Secondary | ICD-10-CM | POA: Diagnosis not present

## 2020-08-27 DIAGNOSIS — J9601 Acute respiratory failure with hypoxia: Secondary | ICD-10-CM | POA: Diagnosis not present

## 2020-08-28 DIAGNOSIS — L89122 Pressure ulcer of left upper back, stage 2: Secondary | ICD-10-CM | POA: Diagnosis not present

## 2020-08-28 DIAGNOSIS — J1282 Pneumonia due to coronavirus disease 2019: Secondary | ICD-10-CM | POA: Diagnosis not present

## 2020-08-28 DIAGNOSIS — L89112 Pressure ulcer of right upper back, stage 2: Secondary | ICD-10-CM | POA: Diagnosis not present

## 2020-08-28 DIAGNOSIS — J9601 Acute respiratory failure with hypoxia: Secondary | ICD-10-CM | POA: Diagnosis not present

## 2020-08-28 DIAGNOSIS — U071 COVID-19: Secondary | ICD-10-CM | POA: Diagnosis not present

## 2020-08-29 DIAGNOSIS — J1282 Pneumonia due to coronavirus disease 2019: Secondary | ICD-10-CM | POA: Diagnosis not present

## 2020-08-29 DIAGNOSIS — L89122 Pressure ulcer of left upper back, stage 2: Secondary | ICD-10-CM | POA: Diagnosis not present

## 2020-08-29 DIAGNOSIS — L89112 Pressure ulcer of right upper back, stage 2: Secondary | ICD-10-CM | POA: Diagnosis not present

## 2020-08-29 DIAGNOSIS — U071 COVID-19: Secondary | ICD-10-CM | POA: Diagnosis not present

## 2020-08-29 DIAGNOSIS — J9601 Acute respiratory failure with hypoxia: Secondary | ICD-10-CM | POA: Diagnosis not present

## 2020-08-30 DIAGNOSIS — J9601 Acute respiratory failure with hypoxia: Secondary | ICD-10-CM | POA: Diagnosis not present

## 2020-08-30 DIAGNOSIS — U071 COVID-19: Secondary | ICD-10-CM | POA: Diagnosis not present

## 2020-08-30 DIAGNOSIS — L89112 Pressure ulcer of right upper back, stage 2: Secondary | ICD-10-CM | POA: Diagnosis not present

## 2020-08-30 DIAGNOSIS — I10 Essential (primary) hypertension: Secondary | ICD-10-CM | POA: Diagnosis not present

## 2020-08-30 DIAGNOSIS — J1282 Pneumonia due to coronavirus disease 2019: Secondary | ICD-10-CM | POA: Diagnosis not present

## 2020-08-30 DIAGNOSIS — L89122 Pressure ulcer of left upper back, stage 2: Secondary | ICD-10-CM | POA: Diagnosis not present

## 2020-08-31 DIAGNOSIS — I110 Hypertensive heart disease with heart failure: Secondary | ICD-10-CM | POA: Diagnosis not present

## 2020-08-31 DIAGNOSIS — I509 Heart failure, unspecified: Secondary | ICD-10-CM | POA: Diagnosis not present

## 2020-08-31 DIAGNOSIS — Z720 Tobacco use: Secondary | ICD-10-CM | POA: Diagnosis not present

## 2020-08-31 DIAGNOSIS — J449 Chronic obstructive pulmonary disease, unspecified: Secondary | ICD-10-CM | POA: Diagnosis not present

## 2020-09-02 DIAGNOSIS — J9601 Acute respiratory failure with hypoxia: Secondary | ICD-10-CM | POA: Diagnosis not present

## 2020-09-02 DIAGNOSIS — J1282 Pneumonia due to coronavirus disease 2019: Secondary | ICD-10-CM | POA: Diagnosis not present

## 2020-09-02 DIAGNOSIS — U071 COVID-19: Secondary | ICD-10-CM | POA: Diagnosis not present

## 2020-09-02 DIAGNOSIS — L89112 Pressure ulcer of right upper back, stage 2: Secondary | ICD-10-CM | POA: Diagnosis not present

## 2020-09-02 DIAGNOSIS — L89122 Pressure ulcer of left upper back, stage 2: Secondary | ICD-10-CM | POA: Diagnosis not present

## 2020-09-03 DIAGNOSIS — J9601 Acute respiratory failure with hypoxia: Secondary | ICD-10-CM | POA: Diagnosis not present

## 2020-09-03 DIAGNOSIS — L89122 Pressure ulcer of left upper back, stage 2: Secondary | ICD-10-CM | POA: Diagnosis not present

## 2020-09-03 DIAGNOSIS — U071 COVID-19: Secondary | ICD-10-CM | POA: Diagnosis not present

## 2020-09-03 DIAGNOSIS — J1282 Pneumonia due to coronavirus disease 2019: Secondary | ICD-10-CM | POA: Diagnosis not present

## 2020-09-03 DIAGNOSIS — L89112 Pressure ulcer of right upper back, stage 2: Secondary | ICD-10-CM | POA: Diagnosis not present

## 2020-09-05 DIAGNOSIS — L89122 Pressure ulcer of left upper back, stage 2: Secondary | ICD-10-CM | POA: Diagnosis not present

## 2020-09-05 DIAGNOSIS — J9601 Acute respiratory failure with hypoxia: Secondary | ICD-10-CM | POA: Diagnosis not present

## 2020-09-05 DIAGNOSIS — L89112 Pressure ulcer of right upper back, stage 2: Secondary | ICD-10-CM | POA: Diagnosis not present

## 2020-09-05 DIAGNOSIS — U071 COVID-19: Secondary | ICD-10-CM | POA: Diagnosis not present

## 2020-09-05 DIAGNOSIS — J1282 Pneumonia due to coronavirus disease 2019: Secondary | ICD-10-CM | POA: Diagnosis not present

## 2020-09-05 DIAGNOSIS — I10 Essential (primary) hypertension: Secondary | ICD-10-CM | POA: Diagnosis not present

## 2020-09-06 DIAGNOSIS — J9601 Acute respiratory failure with hypoxia: Secondary | ICD-10-CM | POA: Diagnosis not present

## 2020-09-06 DIAGNOSIS — L89112 Pressure ulcer of right upper back, stage 2: Secondary | ICD-10-CM | POA: Diagnosis not present

## 2020-09-06 DIAGNOSIS — J1282 Pneumonia due to coronavirus disease 2019: Secondary | ICD-10-CM | POA: Diagnosis not present

## 2020-09-06 DIAGNOSIS — L89122 Pressure ulcer of left upper back, stage 2: Secondary | ICD-10-CM | POA: Diagnosis not present

## 2020-09-06 DIAGNOSIS — U071 COVID-19: Secondary | ICD-10-CM | POA: Diagnosis not present

## 2020-09-09 DIAGNOSIS — L89122 Pressure ulcer of left upper back, stage 2: Secondary | ICD-10-CM | POA: Diagnosis not present

## 2020-09-09 DIAGNOSIS — U071 COVID-19: Secondary | ICD-10-CM | POA: Diagnosis not present

## 2020-09-09 DIAGNOSIS — L89112 Pressure ulcer of right upper back, stage 2: Secondary | ICD-10-CM | POA: Diagnosis not present

## 2020-09-09 DIAGNOSIS — J1282 Pneumonia due to coronavirus disease 2019: Secondary | ICD-10-CM | POA: Diagnosis not present

## 2020-09-09 DIAGNOSIS — J9601 Acute respiratory failure with hypoxia: Secondary | ICD-10-CM | POA: Diagnosis not present

## 2020-09-10 DIAGNOSIS — J1282 Pneumonia due to coronavirus disease 2019: Secondary | ICD-10-CM | POA: Diagnosis not present

## 2020-09-10 DIAGNOSIS — L89122 Pressure ulcer of left upper back, stage 2: Secondary | ICD-10-CM | POA: Diagnosis not present

## 2020-09-10 DIAGNOSIS — U071 COVID-19: Secondary | ICD-10-CM | POA: Diagnosis not present

## 2020-09-10 DIAGNOSIS — L89112 Pressure ulcer of right upper back, stage 2: Secondary | ICD-10-CM | POA: Diagnosis not present

## 2020-09-10 DIAGNOSIS — J9601 Acute respiratory failure with hypoxia: Secondary | ICD-10-CM | POA: Diagnosis not present

## 2020-09-11 DIAGNOSIS — U071 COVID-19: Secondary | ICD-10-CM | POA: Diagnosis not present

## 2020-09-11 DIAGNOSIS — L89122 Pressure ulcer of left upper back, stage 2: Secondary | ICD-10-CM | POA: Diagnosis not present

## 2020-09-11 DIAGNOSIS — J1282 Pneumonia due to coronavirus disease 2019: Secondary | ICD-10-CM | POA: Diagnosis not present

## 2020-09-11 DIAGNOSIS — J9601 Acute respiratory failure with hypoxia: Secondary | ICD-10-CM | POA: Diagnosis not present

## 2020-09-11 DIAGNOSIS — L89112 Pressure ulcer of right upper back, stage 2: Secondary | ICD-10-CM | POA: Diagnosis not present

## 2020-09-12 DIAGNOSIS — U071 COVID-19: Secondary | ICD-10-CM | POA: Diagnosis not present

## 2020-09-12 DIAGNOSIS — J1282 Pneumonia due to coronavirus disease 2019: Secondary | ICD-10-CM | POA: Diagnosis not present

## 2020-09-12 DIAGNOSIS — L89112 Pressure ulcer of right upper back, stage 2: Secondary | ICD-10-CM | POA: Diagnosis not present

## 2020-09-12 DIAGNOSIS — L89122 Pressure ulcer of left upper back, stage 2: Secondary | ICD-10-CM | POA: Diagnosis not present

## 2020-09-12 DIAGNOSIS — J9601 Acute respiratory failure with hypoxia: Secondary | ICD-10-CM | POA: Diagnosis not present

## 2020-09-16 DIAGNOSIS — J9601 Acute respiratory failure with hypoxia: Secondary | ICD-10-CM | POA: Diagnosis not present

## 2020-09-16 DIAGNOSIS — U071 COVID-19: Secondary | ICD-10-CM | POA: Diagnosis not present

## 2020-09-16 DIAGNOSIS — J1282 Pneumonia due to coronavirus disease 2019: Secondary | ICD-10-CM | POA: Diagnosis not present

## 2020-09-16 DIAGNOSIS — L89112 Pressure ulcer of right upper back, stage 2: Secondary | ICD-10-CM | POA: Diagnosis not present

## 2020-09-16 DIAGNOSIS — L89122 Pressure ulcer of left upper back, stage 2: Secondary | ICD-10-CM | POA: Diagnosis not present

## 2020-09-17 DIAGNOSIS — J9601 Acute respiratory failure with hypoxia: Secondary | ICD-10-CM | POA: Diagnosis not present

## 2020-09-17 DIAGNOSIS — J1282 Pneumonia due to coronavirus disease 2019: Secondary | ICD-10-CM | POA: Diagnosis not present

## 2020-09-17 DIAGNOSIS — U071 COVID-19: Secondary | ICD-10-CM | POA: Diagnosis not present

## 2020-09-17 DIAGNOSIS — L89112 Pressure ulcer of right upper back, stage 2: Secondary | ICD-10-CM | POA: Diagnosis not present

## 2020-09-17 DIAGNOSIS — L89122 Pressure ulcer of left upper back, stage 2: Secondary | ICD-10-CM | POA: Diagnosis not present

## 2020-09-17 DIAGNOSIS — H18893 Other specified disorders of cornea, bilateral: Secondary | ICD-10-CM | POA: Diagnosis not present

## 2020-09-18 DIAGNOSIS — L89122 Pressure ulcer of left upper back, stage 2: Secondary | ICD-10-CM | POA: Diagnosis not present

## 2020-09-18 DIAGNOSIS — J449 Chronic obstructive pulmonary disease, unspecified: Secondary | ICD-10-CM | POA: Diagnosis not present

## 2020-09-18 DIAGNOSIS — J209 Acute bronchitis, unspecified: Secondary | ICD-10-CM | POA: Diagnosis not present

## 2020-09-18 DIAGNOSIS — U071 COVID-19: Secondary | ICD-10-CM | POA: Diagnosis not present

## 2020-09-18 DIAGNOSIS — J1282 Pneumonia due to coronavirus disease 2019: Secondary | ICD-10-CM | POA: Diagnosis not present

## 2020-09-18 DIAGNOSIS — J329 Chronic sinusitis, unspecified: Secondary | ICD-10-CM | POA: Diagnosis not present

## 2020-09-18 DIAGNOSIS — L89112 Pressure ulcer of right upper back, stage 2: Secondary | ICD-10-CM | POA: Diagnosis not present

## 2020-09-18 DIAGNOSIS — J9601 Acute respiratory failure with hypoxia: Secondary | ICD-10-CM | POA: Diagnosis not present

## 2020-09-19 ENCOUNTER — Emergency Department (HOSPITAL_COMMUNITY)
Admission: EM | Admit: 2020-09-19 | Discharge: 2020-09-19 | Disposition: A | Discharge status: Home / Self Care | Payer: Medicare Other | Attending: Emergency Medicine | Admitting: Emergency Medicine

## 2020-09-19 ENCOUNTER — Encounter (HOSPITAL_COMMUNITY): Payer: Self-pay | Admitting: *Deleted

## 2020-09-19 ENCOUNTER — Other Ambulatory Visit: Payer: Self-pay

## 2020-09-19 ENCOUNTER — Emergency Department (HOSPITAL_COMMUNITY): Payer: Medicare Other

## 2020-09-19 DIAGNOSIS — U071 COVID-19: Secondary | ICD-10-CM | POA: Diagnosis not present

## 2020-09-19 DIAGNOSIS — J9601 Acute respiratory failure with hypoxia: Secondary | ICD-10-CM | POA: Diagnosis not present

## 2020-09-19 DIAGNOSIS — J690 Pneumonitis due to inhalation of food and vomit: Secondary | ICD-10-CM | POA: Diagnosis not present

## 2020-09-19 DIAGNOSIS — J90 Pleural effusion, not elsewhere classified: Secondary | ICD-10-CM | POA: Diagnosis not present

## 2020-09-19 DIAGNOSIS — Z7982 Long term (current) use of aspirin: Secondary | ICD-10-CM | POA: Diagnosis not present

## 2020-09-19 DIAGNOSIS — Z79899 Other long term (current) drug therapy: Secondary | ICD-10-CM | POA: Insufficient documentation

## 2020-09-19 DIAGNOSIS — J1282 Pneumonia due to coronavirus disease 2019: Secondary | ICD-10-CM | POA: Diagnosis not present

## 2020-09-19 DIAGNOSIS — I10 Essential (primary) hypertension: Secondary | ICD-10-CM | POA: Insufficient documentation

## 2020-09-19 DIAGNOSIS — Z8616 Personal history of COVID-19: Secondary | ICD-10-CM | POA: Diagnosis not present

## 2020-09-19 DIAGNOSIS — Z87891 Personal history of nicotine dependence: Secondary | ICD-10-CM | POA: Insufficient documentation

## 2020-09-19 DIAGNOSIS — L89112 Pressure ulcer of right upper back, stage 2: Secondary | ICD-10-CM | POA: Diagnosis not present

## 2020-09-19 DIAGNOSIS — R059 Cough, unspecified: Secondary | ICD-10-CM | POA: Diagnosis not present

## 2020-09-19 DIAGNOSIS — L89122 Pressure ulcer of left upper back, stage 2: Secondary | ICD-10-CM | POA: Diagnosis not present

## 2020-09-19 HISTORY — DX: COVID-19: U07.1

## 2020-09-19 HISTORY — DX: Legal blindness, as defined in USA: H54.8

## 2020-09-19 LAB — CBC WITH DIFFERENTIAL/PLATELET
Abs Immature Granulocytes: 0.17 10*3/uL — ABNORMAL HIGH (ref 0.00–0.07)
Basophils Absolute: 0.1 10*3/uL (ref 0.0–0.1)
Basophils Relative: 1 %
Eosinophils Absolute: 0.1 10*3/uL (ref 0.0–0.5)
Eosinophils Relative: 1 %
HCT: 35.6 % — ABNORMAL LOW (ref 36.0–46.0)
Hemoglobin: 10.9 g/dL — ABNORMAL LOW (ref 12.0–15.0)
Immature Granulocytes: 2 %
Lymphocytes Relative: 27 %
Lymphs Abs: 2 10*3/uL (ref 0.7–4.0)
MCH: 25.2 pg — ABNORMAL LOW (ref 26.0–34.0)
MCHC: 30.6 g/dL (ref 30.0–36.0)
MCV: 82.4 fL (ref 80.0–100.0)
Monocytes Absolute: 1.2 10*3/uL — ABNORMAL HIGH (ref 0.1–1.0)
Monocytes Relative: 16 %
Neutro Abs: 4.1 10*3/uL (ref 1.7–7.7)
Neutrophils Relative %: 53 %
Platelets: 491 10*3/uL — ABNORMAL HIGH (ref 150–400)
RBC: 4.32 MIL/uL (ref 3.87–5.11)
RDW: 21.1 % — ABNORMAL HIGH (ref 11.5–15.5)
WBC: 7.6 10*3/uL (ref 4.0–10.5)
nRBC: 0 % (ref 0.0–0.2)

## 2020-09-19 LAB — BASIC METABOLIC PANEL
Anion gap: 6 (ref 5–15)
BUN: 24 mg/dL — ABNORMAL HIGH (ref 8–23)
CO2: 24 mmol/L (ref 22–32)
Calcium: 8.6 mg/dL — ABNORMAL LOW (ref 8.9–10.3)
Chloride: 106 mmol/L (ref 98–111)
Creatinine, Ser: 0.57 mg/dL (ref 0.44–1.00)
GFR, Estimated: >60 mL/min (ref >60)
Glucose, Bld: 95 mg/dL (ref 70–99)
Potassium: 4.1 mmol/L (ref 3.5–5.1)
Sodium: 136 mmol/L (ref 135–145)

## 2020-09-19 MED ORDER — LEVOFLOXACIN 500 MG PO TABS: 500.0000 mg | ORAL_TABLET | Freq: Every day | ORAL | 0 refills | Status: DC

## 2020-09-19 NOTE — ED Triage Notes (Signed)
family states Dr. Sherwood Gambler sent pt here due to congestion noted to left lower lobe, pt is on home O2 at 3 L/M all the time.  Family states pt with cough.  Pt is not able to state why she is here. Pt denies any pain.  No fevers or N/V, loose stools are usual for pt per family.

## 2020-09-19 NOTE — ED Provider Notes (Signed)
[facility] EMERGENCY DEPARTMENT Provider Note   CSN: [id number] Arrival date & time: 09/19/20  1503     History Chief Complaint  Patient presents with  . Cough    [name] is a 86 y.o. female.  HPI      This is a 86 year old female with history of hypertension and recent history of Covid pneumonia (Oct 2021) who presents with a cough.  Per the patient's daughter, patient was taking some medication for thrush when she "choked severely."  She reports that her sister was very concerned because she appeared that she could not breathe.  This was this past weekend.  Since that time she has had a cough and they have noticed some mild breath sounds.  She is not had a fever.  Patient is without complaint.  She denies shortness of breath, chest pain, abdominal pain, nausea, vomiting.  Daughter does note that she has some skin breakdown over her thoracic spine because of severe kyphosis and pressure.  They report they spoke to their physician and she was started on Cipro earlier today.  She does not tolerate most antibiotics and only tolerates ciprofloxacin and Levaquin per the daughter.  Past Medical History:  Diagnosis Date  . Blind   . COVID   . HOH (hard of hearing)   . Hypertension   . Legally blind     Patient Active Problem List   Diagnosis Date Noted  . Pressure injury of skin 08/16/2020  . Pneumonia due to COVID-19 virus 08/15/2020  . Acute cystitis without hematuria   . Cough     History reviewed. No pertinent surgical history.   OB History   No obstetric history on file.     History reviewed. No pertinent family history.  Social History   Tobacco Use  . Smoking status: Former Games developer  . Smokeless tobacco: Never Used  Substance Use Topics  . Alcohol use: Not on file  . Drug use: Not on file    Home Medications Prior to Admission medications   Medication Sig Start Date End Date Taking? Authorizing Provider  albuterol (PROVENTIL) (2.5 MG/3ML) 0.083%  nebulizer solution Inhale 1 vial into the lungs 4 (four) times daily as needed. 06/12/20   [provider]  albuterol (VENTOLIN HFA) 108 (90 Base) MCG/ACT inhaler Inhale 2 puffs into the lungs every 6 (six) hours. 08/19/20   [place], Eric J, DO  ALPHAGAN P 0.1 % SOLN Place 1 drop into both eyes 2 (two) times daily. 08/05/20   [provider]  ascorbic acid (VITAMIN C) 500 MG tablet Take 1 tablet (500 mg total) by mouth daily. 08/20/20 09/19/20  [place], Alvira Philips, DO  aspirin EC 81 MG tablet Take 81 mg by mouth daily. Swallow whole.    [provider]  Calcium 600-200 MG-UNIT tablet Take 1 tablet by mouth daily.    [provider]  cyanocobalamin (,VITAMIN B-12,) 1000 MCG/ML injection Inject 1,000 mcg into the muscle every 30 (thirty) days. 08/05/20   [provider]  desloratadine (CLARINEX) 5 MG tablet Take 5 mg by mouth daily as needed.    [provider]  diltiazem (CARDIZEM CD) 180 MG 24 hr capsule Take 180 mg by mouth daily. 06/25/20   [provider]  dorzolamide-timolol (COSOPT) 22.3-6.8 MG/ML ophthalmic solution Place 1 drop into both eyes 2 (two) times daily. 08/05/20   [provider]  furosemide (LASIX) 20 MG tablet Take 20 mg by mouth daily as needed.    [provider]  guaiFENesin-dextromethorphan (ROBITUSSIN DM) 100-10 MG/5ML syrup Take 10 mLs by mouth every 4 (four) hours as needed for cough. 08/19/20   [place], Alvira Philips, DO  ibuprofen (ADVIL) 200 MG tablet Take 400 mg by mouth every 6 (six) hours as needed.    [provider]  ipratropium (ATROVENT) 0.02 % nebulizer solution Inhale 1 vial into the lungs 4 (four) times daily as needed. 06/12/20   [provider]  levofloxacin (LEVAQUIN) 500 MG tablet Take 1 tablet (500 mg total) by mouth daily. 09/19/20   Lakenya Riendeau, Mayer Masker, MD  multivitamin-iron-minerals-folic acid (CENTRUM) chewable tablet Chew 1 tablet by mouth daily.    [provider]   neomycin-polymyxin b-dexamethasone (MAXITROL) 3.5-10000-0.1 SUSP Place 1 drop into the left eye 2 (two) times daily. 08/12/20   [provider]  omeprazole (PRILOSEC) 20 MG capsule Take 20 mg by mouth daily. 05/31/20   [provider]  Polyethyl Glycol-Propyl Glycol (SYSTANE) 0.4-0.3 % SOLN Apply 1 drop to eye every 6 (six) hours as needed.    [provider]  sodium chloride (MURO 128) 5 % ophthalmic solution Place 1 drop into the left eye as needed for eye irritation.    [provider]  zinc sulfate 220 (50 Zn) MG capsule Take 1 capsule (220 mg total) by mouth daily. 08/20/20 09/19/20  [place], Eric J, DO    Allergies    Iohexol, Azithromycin, Doxycycline, Gentamicin, Ibandronic acid, Nitrofurantoin, Penicillins, Prednisone, Strawberry (diagnostic), Sulfa antibiotics, Travatan [travoprost], and Xalatan [latanoprost]  Review of Systems   Review of Systems  Constitutional: Negative for fever.  Respiratory: Positive for cough. Negative for shortness of breath.   Cardiovascular: Negative for chest pain.  Gastrointestinal: Negative for abdominal pain, nausea and vomiting.  Genitourinary: Negative for dysuria.  Skin: Positive for wound.  All other systems reviewed and are negative.   Physical Exam Updated Vital Signs BP 140/72 (BP Location: Left Arm)   Pulse 81   Temp 98.5 F (36.9 C) (Oral)   Resp 16   Ht 1.473 m (4\' 10" )   Wt 39.2 kg   SpO2 98%   BMI 18.06 kg/m   Physical Exam Vitals and nursing note reviewed.  Constitutional:      Appearance: She is well-developed.     Comments: Elderly, nontoxic-appearing, nasal cannula in place, no acute distress  HENT:     Head: Normocephalic and atraumatic.     Nose: Nose normal.     Mouth/Throat:     Mouth: Mucous membranes are moist.  Eyes:     Extraocular Movements: Extraocular movements intact.  Cardiovascular:     Rate and Rhythm: Normal rate and regular rhythm.     Heart sounds: Normal  heart sounds.  Pulmonary:     Effort: Pulmonary effort is normal. No respiratory distress.     Breath sounds: No wheezing.     Comments: Rhonchorous breath sounds bilateral lower lobes, nasal cannula in place Abdominal:     General: Bowel sounds are normal.     Palpations: Abdomen is soft.     Tenderness: There is no abdominal tenderness. There is no guarding or rebound.  Musculoskeletal:     Cervical back: Neck supple.     Right lower leg: No edema.     Left lower leg: No edema.     Comments: Severe kyphosis  Skin:    General: Skin is warm and dry.     Comments: Skin breakdown over the mid Thoracics spine, no drainage, no  significant erythema  Neurological:     Mental Status: She is alert and oriented to person, place, and time.  Psychiatric:        Mood and Affect: Mood normal.     ED Results / Procedures / Treatments   Labs (all labs ordered are listed, but only abnormal results are displayed) Labs Reviewed  CBC WITH DIFFERENTIAL/PLATELET - Abnormal; Notable for the following components:      Result Value   Hemoglobin 10.9 (*)    HCT 35.6 (*)    MCH 25.2 (*)    RDW 21.1 (*)    Platelets 491 (*)    Monocytes Absolute 1.2 (*)    Abs Immature Granulocytes 0.17 (*)    All other components within normal limits  BASIC METABOLIC PANEL - Abnormal; Notable for the following components:   BUN 24 (*)    Calcium 8.6 (*)    All other components within normal limits    EKG None  Radiology DG Chest 2 View  Result Date: 09/19/2020 CLINICAL DATA:  Cough EXAM: CHEST - 2 VIEW COMPARISON:  Radiograph earlier in the day FINDINGS: The heart size and mediastinal contours are unchanged with mild cardiomegaly. Aortic knob calcifications seen. There is interval improvement in the interstitial opacities throughout both lungs. A right and trace effusion is seen. No acute osseous. IMPRESSION: Slight interval improvement in the diffuse interstitial opacities which could be due to pulmonary  edema. Small right and trace pleural effusion Electronically Signed   By: Jonna Clark M.D.   On: 09/19/2020 18:04   DG Chest Portable 1 View  Result Date: 09/19/2020 CLINICAL DATA:  Cough. EXAM: PORTABLE CHEST 1 VIEW COMPARISON:  08/15/2020. FINDINGS: Dense bibasilar consolidation with hazy opacities throughout the right lung. Findings are concerning for multifocal pneumonia, although asymmetric pulmonary edema is a consideration. Possible right pleural effusion, although patient positioning/thoracic kyphosis limits evaluation. Cardiac silhouette is largely obscured. Thoracic atherosclerosis. No visible pleural effusion on this limited radiograph. IMPRESSION: 1. Dense bibasilar consolidation with hazy opacities throughout the right lung. Findings are concerning for multifocal pneumonia, although asymmetric pulmonary edema is a consideration. 2. Possible right pleural effusion, although patient positioning/thoracic kyphosis limits evaluation. Dedicated PA and lateral radiographs could further evaluate if clinically indicated. Electronically Signed   By: Feliberto Harts MD   On: 09/19/2020 15:58    Procedures Procedures (including critical care time)  Medications Ordered in ED Medications - No data to display  ED Course  I have reviewed the triage vital signs and the nursing notes.  Pertinent labs & imaging results that were available during my care of the patient were reviewed by me and considered in my medical decision making (see chart for details).    MDM Rules/Calculators/A&P                          This is a 86 year old female with recent history of COVID-19 on home oxygen who presents with cough.  Daughter describes an episode of choking last weekend which would put her at high risk for aspiration.  On my physical exam, she is nontoxic-appearing.  Vital signs are notable that she is afebrile and satting 98% on her home oxygen requirement.  She is rhonchorous on exam but no respiratory  distress.  Chest x-ray with possible multifocal pneumonia and pleural effusion.  Recommend 2 view.  This was ordered.  Labs reviewed.  She has a white count of 7.6.  No significant metabolic derangements.  Given that she was recently Covid positive and admitted, do not feel she needs repeat Covid testing at this time.  Repeat two-view chest x-ray shows slight improvement of the opacities which may suggest edema although on clinical exam she does not appear volume overloaded.  She does have a small right and trace pleural effusion.  Feel her history and physical exam is most consistent with likely aspiration.  Will transition to Levaquin for better pulmonary coverage.  She does not tolerate any other medications that would cover for anaerobic or aspiration pneumonia.  Recommend close follow-up with primary physician.  After history, exam, and medical workup I feel the patient has been appropriately medically screened and is safe for discharge home. Pertinent diagnoses were discussed with the patient. Patient was given return precautions.  Final Clinical Impression(s) / ED Diagnoses Final diagnoses:  Cough  Aspiration pneumonia of right lower lobe, unspecified aspiration pneumonia type [facility](HCC)    Rx / DC Orders ED Discharge Orders         Ordered    levofloxacin (LEVAQUIN) 500 MG tablet  Daily        09/19/20 1811           Shon BatonHorton, Taisley Mordan F, MD 09/19/20 2255

## 2020-09-19 NOTE — ED Notes (Signed)
Entered room and introduced self to patient and family at the bedside. Bed is locked in the lowest position, side rails x2, call bell within reach. All questions and concerns voiced addressed at this time. Family educated on call light use and hourly rounding and is in agreement at this time. Pt appears in no acute distress, respirations are even and unlabored with equal chest rise and fall.

## 2020-09-19 NOTE — ED Notes (Signed)
Pt in x-ray at this time

## 2020-09-19 NOTE — Discharge Instructions (Addendum)
Given your history and chest x-ray findings, you may have an aspiration pneumonia.  Take Levaquin and discontinue taking ciprofloxacin.  Follow-up with your primary doctor.

## 2020-09-23 DIAGNOSIS — L89122 Pressure ulcer of left upper back, stage 2: Secondary | ICD-10-CM | POA: Diagnosis not present

## 2020-09-23 DIAGNOSIS — J1282 Pneumonia due to coronavirus disease 2019: Secondary | ICD-10-CM | POA: Diagnosis not present

## 2020-09-23 DIAGNOSIS — J9601 Acute respiratory failure with hypoxia: Secondary | ICD-10-CM | POA: Diagnosis not present

## 2020-09-23 DIAGNOSIS — U071 COVID-19: Secondary | ICD-10-CM | POA: Diagnosis not present

## 2020-09-23 DIAGNOSIS — L89112 Pressure ulcer of right upper back, stage 2: Secondary | ICD-10-CM | POA: Diagnosis not present

## 2020-09-24 DIAGNOSIS — L89122 Pressure ulcer of left upper back, stage 2: Secondary | ICD-10-CM | POA: Diagnosis not present

## 2020-09-24 DIAGNOSIS — J1282 Pneumonia due to coronavirus disease 2019: Secondary | ICD-10-CM | POA: Diagnosis not present

## 2020-09-24 DIAGNOSIS — J9601 Acute respiratory failure with hypoxia: Secondary | ICD-10-CM | POA: Diagnosis not present

## 2020-09-24 DIAGNOSIS — L89112 Pressure ulcer of right upper back, stage 2: Secondary | ICD-10-CM | POA: Diagnosis not present

## 2020-09-24 DIAGNOSIS — U071 COVID-19: Secondary | ICD-10-CM | POA: Diagnosis not present

## 2020-09-25 DIAGNOSIS — J9601 Acute respiratory failure with hypoxia: Secondary | ICD-10-CM | POA: Diagnosis not present

## 2020-09-25 DIAGNOSIS — L89112 Pressure ulcer of right upper back, stage 2: Secondary | ICD-10-CM | POA: Diagnosis not present

## 2020-09-25 DIAGNOSIS — U071 COVID-19: Secondary | ICD-10-CM | POA: Diagnosis not present

## 2020-09-25 DIAGNOSIS — L89122 Pressure ulcer of left upper back, stage 2: Secondary | ICD-10-CM | POA: Diagnosis not present

## 2020-09-25 DIAGNOSIS — J1282 Pneumonia due to coronavirus disease 2019: Secondary | ICD-10-CM | POA: Diagnosis not present

## 2020-10-01 DIAGNOSIS — J690 Pneumonitis due to inhalation of food and vomit: Secondary | ICD-10-CM | POA: Diagnosis not present

## 2020-10-01 DIAGNOSIS — Z720 Tobacco use: Secondary | ICD-10-CM | POA: Diagnosis not present

## 2020-10-01 DIAGNOSIS — Z681 Body mass index (BMI) 19 or less, adult: Secondary | ICD-10-CM | POA: Diagnosis not present

## 2020-10-01 DIAGNOSIS — I110 Hypertensive heart disease with heart failure: Secondary | ICD-10-CM | POA: Diagnosis not present

## 2020-10-01 DIAGNOSIS — J449 Chronic obstructive pulmonary disease, unspecified: Secondary | ICD-10-CM | POA: Diagnosis not present

## 2020-10-01 DIAGNOSIS — I2510 Atherosclerotic heart disease of native coronary artery without angina pectoris: Secondary | ICD-10-CM | POA: Diagnosis not present

## 2020-10-01 DIAGNOSIS — D508 Other iron deficiency anemias: Secondary | ICD-10-CM | POA: Diagnosis not present

## 2020-10-01 DIAGNOSIS — I509 Heart failure, unspecified: Secondary | ICD-10-CM | POA: Diagnosis not present

## 2020-10-01 DIAGNOSIS — E441 Mild protein-calorie malnutrition: Secondary | ICD-10-CM | POA: Diagnosis not present

## 2020-10-02 DIAGNOSIS — L89112 Pressure ulcer of right upper back, stage 2: Secondary | ICD-10-CM | POA: Diagnosis not present

## 2020-10-02 DIAGNOSIS — L89122 Pressure ulcer of left upper back, stage 2: Secondary | ICD-10-CM | POA: Diagnosis not present

## 2020-10-02 DIAGNOSIS — J1282 Pneumonia due to coronavirus disease 2019: Secondary | ICD-10-CM | POA: Diagnosis not present

## 2020-10-02 DIAGNOSIS — U071 COVID-19: Secondary | ICD-10-CM | POA: Diagnosis not present

## 2020-10-02 DIAGNOSIS — J9601 Acute respiratory failure with hypoxia: Secondary | ICD-10-CM | POA: Diagnosis not present

## 2020-10-03 DIAGNOSIS — J1282 Pneumonia due to coronavirus disease 2019: Secondary | ICD-10-CM | POA: Diagnosis not present

## 2020-10-03 DIAGNOSIS — L89122 Pressure ulcer of left upper back, stage 2: Secondary | ICD-10-CM | POA: Diagnosis not present

## 2020-10-03 DIAGNOSIS — L89112 Pressure ulcer of right upper back, stage 2: Secondary | ICD-10-CM | POA: Diagnosis not present

## 2020-10-03 DIAGNOSIS — J9601 Acute respiratory failure with hypoxia: Secondary | ICD-10-CM | POA: Diagnosis not present

## 2020-10-03 DIAGNOSIS — U071 COVID-19: Secondary | ICD-10-CM | POA: Diagnosis not present

## 2020-10-07 DIAGNOSIS — U071 COVID-19: Secondary | ICD-10-CM | POA: Diagnosis not present

## 2020-10-07 DIAGNOSIS — L89112 Pressure ulcer of right upper back, stage 2: Secondary | ICD-10-CM | POA: Diagnosis not present

## 2020-10-07 DIAGNOSIS — L89122 Pressure ulcer of left upper back, stage 2: Secondary | ICD-10-CM | POA: Diagnosis not present

## 2020-10-07 DIAGNOSIS — J1282 Pneumonia due to coronavirus disease 2019: Secondary | ICD-10-CM | POA: Diagnosis not present

## 2020-10-07 DIAGNOSIS — J9601 Acute respiratory failure with hypoxia: Secondary | ICD-10-CM | POA: Diagnosis not present

## 2020-10-08 DIAGNOSIS — J1282 Pneumonia due to coronavirus disease 2019: Secondary | ICD-10-CM | POA: Diagnosis not present

## 2020-10-08 DIAGNOSIS — L89112 Pressure ulcer of right upper back, stage 2: Secondary | ICD-10-CM | POA: Diagnosis not present

## 2020-10-08 DIAGNOSIS — J9601 Acute respiratory failure with hypoxia: Secondary | ICD-10-CM | POA: Diagnosis not present

## 2020-10-08 DIAGNOSIS — L89122 Pressure ulcer of left upper back, stage 2: Secondary | ICD-10-CM | POA: Diagnosis not present

## 2020-10-08 DIAGNOSIS — U071 COVID-19: Secondary | ICD-10-CM | POA: Diagnosis not present

## 2020-10-09 DIAGNOSIS — U071 COVID-19: Secondary | ICD-10-CM | POA: Diagnosis not present

## 2020-10-09 DIAGNOSIS — J1282 Pneumonia due to coronavirus disease 2019: Secondary | ICD-10-CM | POA: Diagnosis not present

## 2020-10-09 DIAGNOSIS — L89122 Pressure ulcer of left upper back, stage 2: Secondary | ICD-10-CM | POA: Diagnosis not present

## 2020-10-09 DIAGNOSIS — L89112 Pressure ulcer of right upper back, stage 2: Secondary | ICD-10-CM | POA: Diagnosis not present

## 2020-10-09 DIAGNOSIS — J9601 Acute respiratory failure with hypoxia: Secondary | ICD-10-CM | POA: Diagnosis not present

## 2020-10-19 DIAGNOSIS — U071 COVID-19: Secondary | ICD-10-CM | POA: Diagnosis not present

## 2020-11-01 DIAGNOSIS — I110 Hypertensive heart disease with heart failure: Secondary | ICD-10-CM | POA: Diagnosis not present

## 2020-11-01 DIAGNOSIS — I509 Heart failure, unspecified: Secondary | ICD-10-CM | POA: Diagnosis not present

## 2020-11-01 DIAGNOSIS — Z720 Tobacco use: Secondary | ICD-10-CM | POA: Diagnosis not present

## 2020-11-01 DIAGNOSIS — J449 Chronic obstructive pulmonary disease, unspecified: Secondary | ICD-10-CM | POA: Diagnosis not present

## 2020-11-13 DIAGNOSIS — Z681 Body mass index (BMI) 19 or less, adult: Secondary | ICD-10-CM | POA: Diagnosis not present

## 2020-11-13 DIAGNOSIS — N390 Urinary tract infection, site not specified: Secondary | ICD-10-CM | POA: Diagnosis not present

## 2020-11-13 DIAGNOSIS — R350 Frequency of micturition: Secondary | ICD-10-CM | POA: Diagnosis not present

## 2020-11-13 DIAGNOSIS — N342 Other urethritis: Secondary | ICD-10-CM | POA: Diagnosis not present

## 2020-11-19 DIAGNOSIS — U071 COVID-19: Secondary | ICD-10-CM | POA: Diagnosis not present

## 2020-11-30 DIAGNOSIS — I110 Hypertensive heart disease with heart failure: Secondary | ICD-10-CM | POA: Diagnosis not present

## 2020-11-30 DIAGNOSIS — Z720 Tobacco use: Secondary | ICD-10-CM | POA: Diagnosis not present

## 2020-11-30 DIAGNOSIS — I509 Heart failure, unspecified: Secondary | ICD-10-CM | POA: Diagnosis not present

## 2020-11-30 DIAGNOSIS — J449 Chronic obstructive pulmonary disease, unspecified: Secondary | ICD-10-CM | POA: Diagnosis not present

## 2020-12-20 DIAGNOSIS — U071 COVID-19: Secondary | ICD-10-CM | POA: Diagnosis not present

## 2020-12-20 DIAGNOSIS — Z681 Body mass index (BMI) 19 or less, adult: Secondary | ICD-10-CM | POA: Diagnosis not present

## 2020-12-20 DIAGNOSIS — D509 Iron deficiency anemia, unspecified: Secondary | ICD-10-CM | POA: Diagnosis not present

## 2020-12-20 DIAGNOSIS — Z0000 Encounter for general adult medical examination without abnormal findings: Secondary | ICD-10-CM | POA: Diagnosis not present

## 2020-12-20 DIAGNOSIS — I10 Essential (primary) hypertension: Secondary | ICD-10-CM | POA: Diagnosis not present

## 2020-12-20 DIAGNOSIS — I509 Heart failure, unspecified: Secondary | ICD-10-CM | POA: Diagnosis not present

## 2021-01-16 DIAGNOSIS — U071 COVID-19: Secondary | ICD-10-CM | POA: Diagnosis not present

## 2021-01-17 DIAGNOSIS — U071 COVID-19: Secondary | ICD-10-CM | POA: Diagnosis not present

## 2021-01-17 DIAGNOSIS — H18893 Other specified disorders of cornea, bilateral: Secondary | ICD-10-CM | POA: Diagnosis not present

## 2021-01-23 DIAGNOSIS — Z23 Encounter for immunization: Secondary | ICD-10-CM | POA: Diagnosis not present

## 2021-01-29 DIAGNOSIS — I110 Hypertensive heart disease with heart failure: Secondary | ICD-10-CM | POA: Diagnosis not present

## 2021-01-29 DIAGNOSIS — E7849 Other hyperlipidemia: Secondary | ICD-10-CM | POA: Diagnosis not present

## 2021-01-29 DIAGNOSIS — J449 Chronic obstructive pulmonary disease, unspecified: Secondary | ICD-10-CM | POA: Diagnosis not present

## 2021-02-06 DIAGNOSIS — E782 Mixed hyperlipidemia: Secondary | ICD-10-CM | POA: Diagnosis not present

## 2021-02-06 DIAGNOSIS — Z681 Body mass index (BMI) 19 or less, adult: Secondary | ICD-10-CM | POA: Diagnosis not present

## 2021-02-06 DIAGNOSIS — E538 Deficiency of other specified B group vitamins: Secondary | ICD-10-CM | POA: Diagnosis not present

## 2021-02-06 DIAGNOSIS — E7849 Other hyperlipidemia: Secondary | ICD-10-CM | POA: Diagnosis not present

## 2021-02-06 DIAGNOSIS — N342 Other urethritis: Secondary | ICD-10-CM | POA: Diagnosis not present

## 2021-02-06 DIAGNOSIS — D519 Vitamin B12 deficiency anemia, unspecified: Secondary | ICD-10-CM | POA: Diagnosis not present

## 2021-02-16 DIAGNOSIS — U071 COVID-19: Secondary | ICD-10-CM | POA: Diagnosis not present

## 2021-02-17 DIAGNOSIS — U071 COVID-19: Secondary | ICD-10-CM | POA: Diagnosis not present

## 2021-02-20 DIAGNOSIS — N390 Urinary tract infection, site not specified: Secondary | ICD-10-CM | POA: Diagnosis not present

## 2021-02-25 DIAGNOSIS — R300 Dysuria: Secondary | ICD-10-CM | POA: Diagnosis not present

## 2021-02-25 DIAGNOSIS — Z681 Body mass index (BMI) 19 or less, adult: Secondary | ICD-10-CM | POA: Diagnosis not present

## 2021-03-05 DIAGNOSIS — H179 Unspecified corneal scar and opacity: Secondary | ICD-10-CM | POA: Diagnosis not present

## 2021-03-05 DIAGNOSIS — Z961 Presence of intraocular lens: Secondary | ICD-10-CM | POA: Diagnosis not present

## 2021-03-18 DIAGNOSIS — U071 COVID-19: Secondary | ICD-10-CM | POA: Diagnosis not present

## 2021-03-19 DIAGNOSIS — U071 COVID-19: Secondary | ICD-10-CM | POA: Diagnosis not present

## 2021-04-02 DIAGNOSIS — H179 Unspecified corneal scar and opacity: Secondary | ICD-10-CM | POA: Diagnosis not present

## 2021-04-02 DIAGNOSIS — Z961 Presence of intraocular lens: Secondary | ICD-10-CM | POA: Diagnosis not present

## 2021-04-18 DIAGNOSIS — U071 COVID-19: Secondary | ICD-10-CM | POA: Diagnosis not present

## 2021-04-19 DIAGNOSIS — U071 COVID-19: Secondary | ICD-10-CM | POA: Diagnosis not present

## 2021-04-21 DIAGNOSIS — H401133 Primary open-angle glaucoma, bilateral, severe stage: Secondary | ICD-10-CM | POA: Diagnosis not present

## 2021-05-01 DIAGNOSIS — I110 Hypertensive heart disease with heart failure: Secondary | ICD-10-CM | POA: Diagnosis not present

## 2021-05-01 DIAGNOSIS — J449 Chronic obstructive pulmonary disease, unspecified: Secondary | ICD-10-CM | POA: Diagnosis not present

## 2021-05-01 DIAGNOSIS — E7849 Other hyperlipidemia: Secondary | ICD-10-CM | POA: Diagnosis not present

## 2021-05-18 DIAGNOSIS — U071 COVID-19: Secondary | ICD-10-CM | POA: Diagnosis not present

## 2021-05-19 DIAGNOSIS — U071 COVID-19: Secondary | ICD-10-CM | POA: Diagnosis not present

## 2021-06-18 DIAGNOSIS — U071 COVID-19: Secondary | ICD-10-CM | POA: Diagnosis not present

## 2021-07-10 DIAGNOSIS — I2510 Atherosclerotic heart disease of native coronary artery without angina pectoris: Secondary | ICD-10-CM | POA: Diagnosis not present

## 2021-07-10 DIAGNOSIS — R21 Rash and other nonspecific skin eruption: Secondary | ICD-10-CM | POA: Diagnosis not present

## 2021-07-10 DIAGNOSIS — M1991 Primary osteoarthritis, unspecified site: Secondary | ICD-10-CM | POA: Diagnosis not present

## 2021-07-10 DIAGNOSIS — I509 Heart failure, unspecified: Secondary | ICD-10-CM | POA: Diagnosis not present

## 2021-07-10 DIAGNOSIS — I110 Hypertensive heart disease with heart failure: Secondary | ICD-10-CM | POA: Diagnosis not present

## 2021-07-10 DIAGNOSIS — Z9981 Dependence on supplemental oxygen: Secondary | ICD-10-CM | POA: Diagnosis not present

## 2021-07-10 DIAGNOSIS — J449 Chronic obstructive pulmonary disease, unspecified: Secondary | ICD-10-CM | POA: Diagnosis not present

## 2021-07-10 DIAGNOSIS — Z681 Body mass index (BMI) 19 or less, adult: Secondary | ICD-10-CM | POA: Diagnosis not present

## 2021-07-10 DIAGNOSIS — M150 Primary generalized (osteo)arthritis: Secondary | ICD-10-CM | POA: Diagnosis not present

## 2021-07-19 DIAGNOSIS — U071 COVID-19: Secondary | ICD-10-CM | POA: Diagnosis not present

## 2021-08-18 DIAGNOSIS — U071 COVID-19: Secondary | ICD-10-CM | POA: Diagnosis not present

## 2021-08-20 DIAGNOSIS — H179 Unspecified corneal scar and opacity: Secondary | ICD-10-CM | POA: Diagnosis not present

## 2021-08-20 DIAGNOSIS — Z961 Presence of intraocular lens: Secondary | ICD-10-CM | POA: Diagnosis not present

## 2021-09-08 IMAGING — DX DG CHEST 1V PORT
1 series · 1 of 1 positions shown · non-contrast
Comparison: 08/15/2020.

CLINICAL DATA: Cough.

EXAM:
PORTABLE CHEST 1 VIEW

[chest ap]
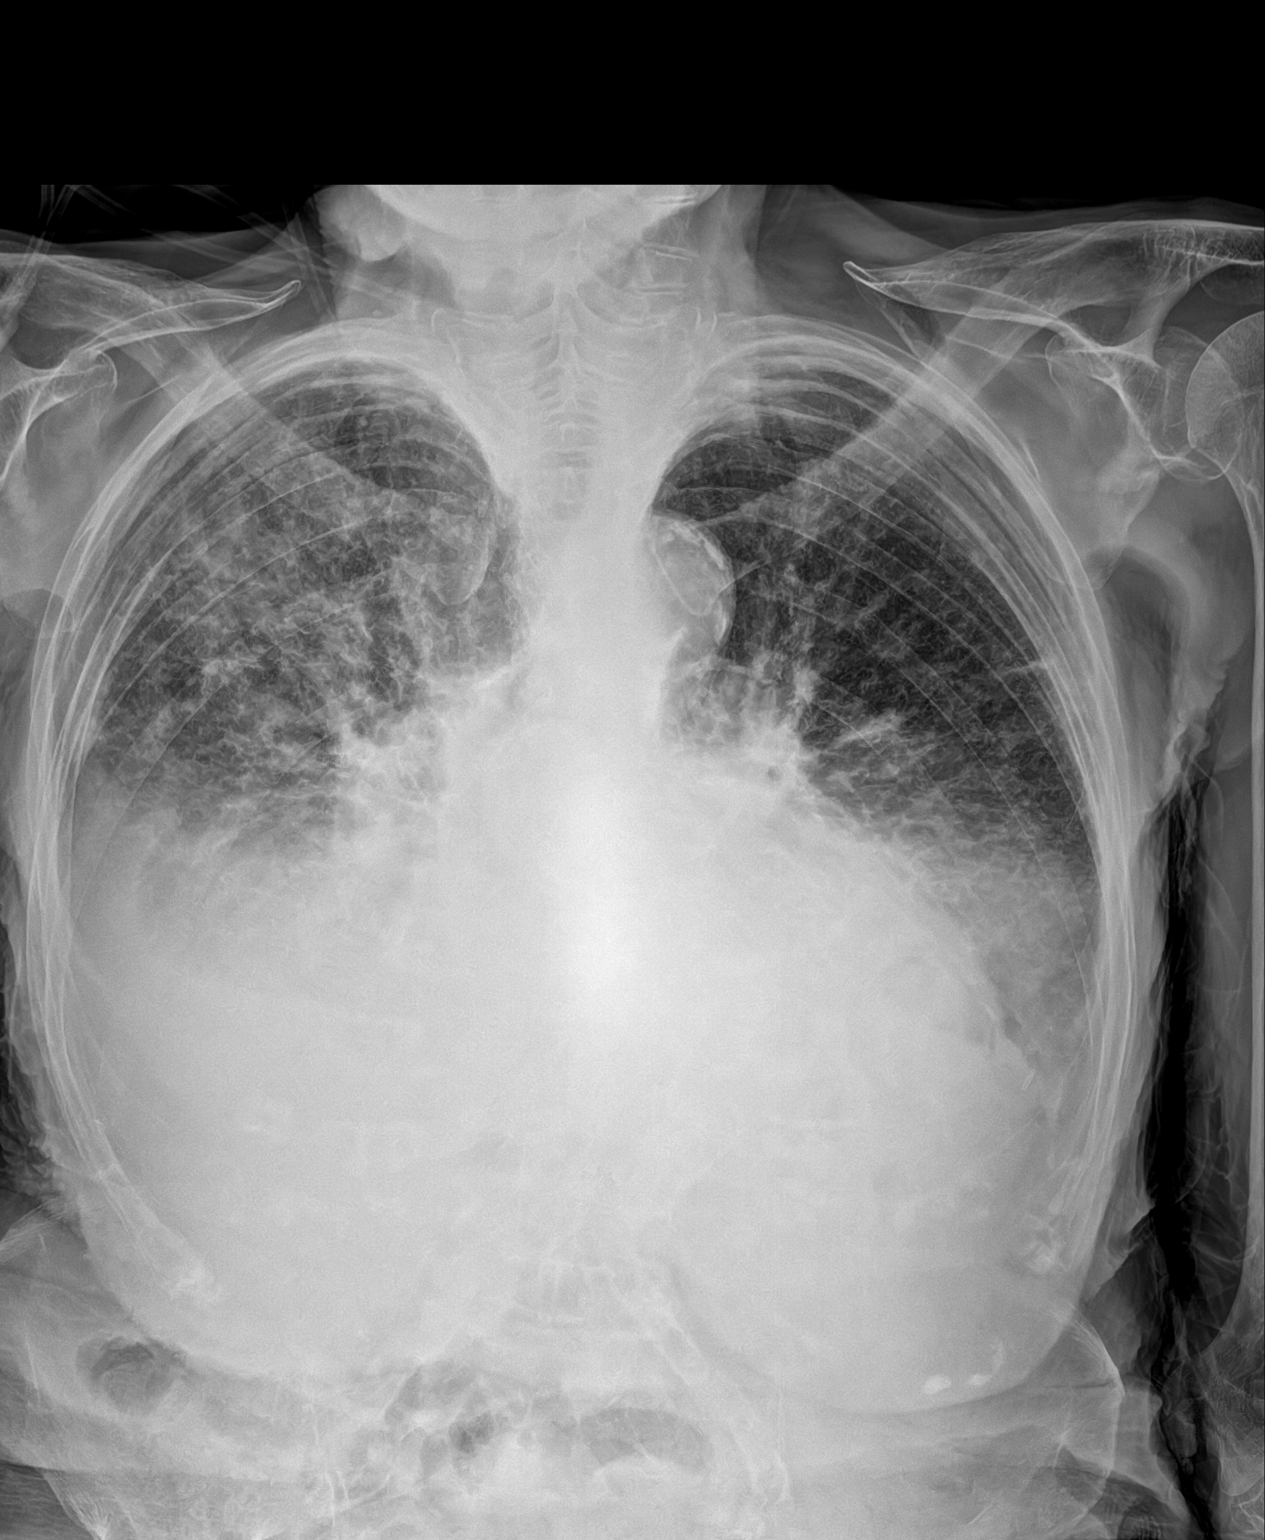

[1 of 1 positions shown; findings below may reference images not displayed]

FINDINGS: Dense bibasilar consolidation with hazy opacities throughout the
right lung. Findings are concerning for multifocal pneumonia,
although asymmetric pulmonary edema is a consideration. Possible
right pleural effusion, although patient positioning/thoracic
kyphosis limits evaluation. Cardiac silhouette is largely obscured.
Thoracic atherosclerosis. No visible pleural effusion on this
limited radiograph.
IMPRESSION: 1. Dense bibasilar consolidation with hazy opacities throughout the
right lung. Findings are concerning for multifocal pneumonia,
although asymmetric pulmonary edema is a consideration.
2. Possible right pleural effusion, although patient
positioning/thoracic kyphosis limits evaluation. Dedicated PA and
lateral radiographs could further evaluate if clinically indicated.

## 2021-09-18 DIAGNOSIS — U071 COVID-19: Secondary | ICD-10-CM | POA: Diagnosis not present

## 2021-10-02 DIAGNOSIS — Z23 Encounter for immunization: Secondary | ICD-10-CM | POA: Diagnosis not present

## 2021-10-18 DIAGNOSIS — U071 COVID-19: Secondary | ICD-10-CM | POA: Diagnosis not present

## 2021-10-31 DIAGNOSIS — J449 Chronic obstructive pulmonary disease, unspecified: Secondary | ICD-10-CM | POA: Diagnosis not present

## 2021-10-31 DIAGNOSIS — E782 Mixed hyperlipidemia: Secondary | ICD-10-CM | POA: Diagnosis not present

## 2021-10-31 DIAGNOSIS — I110 Hypertensive heart disease with heart failure: Secondary | ICD-10-CM | POA: Diagnosis not present

## 2021-11-18 DIAGNOSIS — U071 COVID-19: Secondary | ICD-10-CM | POA: Diagnosis not present

## 2021-11-29 ENCOUNTER — Other Ambulatory Visit: Payer: Self-pay

## 2021-11-29 ENCOUNTER — Emergency Department (HOSPITAL_COMMUNITY): Payer: Medicare Other

## 2021-11-29 ENCOUNTER — Inpatient Hospital Stay (HOSPITAL_COMMUNITY)
Admission: EM | Admit: 2021-11-29 | Discharge: 2021-12-03 | DRG: 659 | Disposition: A | Discharge status: Home / Self Care | Payer: Medicare Other | Attending: Internal Medicine | Admitting: Internal Medicine

## 2021-11-29 ENCOUNTER — Encounter (HOSPITAL_COMMUNITY): Payer: Self-pay

## 2021-11-29 DIAGNOSIS — B9620 Unspecified Escherichia coli [E. coli] as the cause of diseases classified elsewhere: Secondary | ICD-10-CM | POA: Diagnosis present

## 2021-11-29 DIAGNOSIS — N136 Pyonephrosis: Secondary | ICD-10-CM | POA: Diagnosis not present

## 2021-11-29 DIAGNOSIS — Z880 Allergy status to penicillin: Secondary | ICD-10-CM | POA: Diagnosis not present

## 2021-11-29 DIAGNOSIS — L89101 Pressure ulcer of unspecified part of back, stage 1: Secondary | ICD-10-CM | POA: Diagnosis present

## 2021-11-29 DIAGNOSIS — I482 Chronic atrial fibrillation, unspecified: Secondary | ICD-10-CM | POA: Diagnosis present

## 2021-11-29 DIAGNOSIS — K219 Gastro-esophageal reflux disease without esophagitis: Secondary | ICD-10-CM | POA: Diagnosis present

## 2021-11-29 DIAGNOSIS — Z8616 Personal history of COVID-19: Secondary | ICD-10-CM

## 2021-11-29 DIAGNOSIS — D72829 Elevated white blood cell count, unspecified: Secondary | ICD-10-CM | POA: Diagnosis present

## 2021-11-29 DIAGNOSIS — I10 Essential (primary) hypertension: Secondary | ICD-10-CM | POA: Diagnosis not present

## 2021-11-29 DIAGNOSIS — B952 Enterococcus as the cause of diseases classified elsewhere: Secondary | ICD-10-CM | POA: Diagnosis not present

## 2021-11-29 DIAGNOSIS — Z20822 Contact with and (suspected) exposure to covid-19: Secondary | ICD-10-CM | POA: Diagnosis present

## 2021-11-29 DIAGNOSIS — N201 Calculus of ureter: Secondary | ICD-10-CM | POA: Diagnosis not present

## 2021-11-29 DIAGNOSIS — J9621 Acute and chronic respiratory failure with hypoxia: Secondary | ICD-10-CM | POA: Diagnosis not present

## 2021-11-29 DIAGNOSIS — Z9981 Dependence on supplemental oxygen: Secondary | ICD-10-CM | POA: Diagnosis not present

## 2021-11-29 DIAGNOSIS — I110 Hypertensive heart disease with heart failure: Secondary | ICD-10-CM | POA: Diagnosis not present

## 2021-11-29 DIAGNOSIS — N390 Urinary tract infection, site not specified: Secondary | ICD-10-CM | POA: Diagnosis not present

## 2021-11-29 DIAGNOSIS — I499 Cardiac arrhythmia, unspecified: Secondary | ICD-10-CM | POA: Diagnosis not present

## 2021-11-29 DIAGNOSIS — K449 Diaphragmatic hernia without obstruction or gangrene: Secondary | ICD-10-CM | POA: Diagnosis not present

## 2021-11-29 DIAGNOSIS — H548 Legal blindness, as defined in USA: Secondary | ICD-10-CM | POA: Diagnosis present

## 2021-11-29 DIAGNOSIS — Z79899 Other long term (current) drug therapy: Secondary | ICD-10-CM

## 2021-11-29 DIAGNOSIS — E860 Dehydration: Secondary | ICD-10-CM | POA: Diagnosis not present

## 2021-11-29 DIAGNOSIS — R110 Nausea: Secondary | ICD-10-CM | POA: Diagnosis not present

## 2021-11-29 DIAGNOSIS — D649 Anemia, unspecified: Secondary | ICD-10-CM | POA: Diagnosis present

## 2021-11-29 DIAGNOSIS — J449 Chronic obstructive pulmonary disease, unspecified: Secondary | ICD-10-CM | POA: Diagnosis not present

## 2021-11-29 DIAGNOSIS — Z882 Allergy status to sulfonamides status: Secondary | ICD-10-CM

## 2021-11-29 DIAGNOSIS — Z7982 Long term (current) use of aspirin: Secondary | ICD-10-CM

## 2021-11-29 DIAGNOSIS — L89152 Pressure ulcer of sacral region, stage 2: Secondary | ICD-10-CM | POA: Diagnosis present

## 2021-11-29 DIAGNOSIS — R54 Age-related physical debility: Secondary | ICD-10-CM | POA: Diagnosis not present

## 2021-11-29 DIAGNOSIS — N1330 Unspecified hydronephrosis: Secondary | ICD-10-CM

## 2021-11-29 DIAGNOSIS — M549 Dorsalgia, unspecified: Secondary | ICD-10-CM | POA: Diagnosis not present

## 2021-11-29 DIAGNOSIS — N132 Hydronephrosis with renal and ureteral calculous obstruction: Secondary | ICD-10-CM | POA: Diagnosis not present

## 2021-11-29 DIAGNOSIS — N202 Calculus of kidney with calculus of ureter: Principal | ICD-10-CM | POA: Diagnosis present

## 2021-11-29 DIAGNOSIS — Z87891 Personal history of nicotine dependence: Secondary | ICD-10-CM | POA: Diagnosis not present

## 2021-11-29 DIAGNOSIS — E872 Acidosis, unspecified: Secondary | ICD-10-CM | POA: Diagnosis not present

## 2021-11-29 DIAGNOSIS — Z888 Allergy status to other drugs, medicaments and biological substances status: Secondary | ICD-10-CM

## 2021-11-29 DIAGNOSIS — I959 Hypotension, unspecified: Secondary | ICD-10-CM | POA: Diagnosis not present

## 2021-11-29 DIAGNOSIS — R6889 Other general symptoms and signs: Secondary | ICD-10-CM | POA: Diagnosis not present

## 2021-11-29 DIAGNOSIS — N200 Calculus of kidney: Secondary | ICD-10-CM

## 2021-11-29 DIAGNOSIS — I5031 Acute diastolic (congestive) heart failure: Secondary | ICD-10-CM | POA: Diagnosis not present

## 2021-11-29 DIAGNOSIS — I700 Atherosclerosis of aorta: Secondary | ICD-10-CM | POA: Diagnosis not present

## 2021-11-29 DIAGNOSIS — J9601 Acute respiratory failure with hypoxia: Secondary | ICD-10-CM | POA: Diagnosis not present

## 2021-11-29 DIAGNOSIS — D75839 Thrombocytosis, unspecified: Secondary | ICD-10-CM

## 2021-11-29 DIAGNOSIS — Z66 Do not resuscitate: Secondary | ICD-10-CM | POA: Diagnosis present

## 2021-11-29 DIAGNOSIS — H409 Unspecified glaucoma: Secondary | ICD-10-CM | POA: Diagnosis present

## 2021-11-29 DIAGNOSIS — Z743 Need for continuous supervision: Secondary | ICD-10-CM | POA: Diagnosis not present

## 2021-11-29 LAB — COMPREHENSIVE METABOLIC PANEL
ALT: 12 U/L (ref 0–44)
AST: 19 U/L (ref 15–41)
Albumin: 3 g/dL — ABNORMAL LOW (ref 3.5–5.0)
Alkaline Phosphatase: 63 U/L (ref 38–126)
Anion gap: 5 (ref 5–15)
BUN: 26 mg/dL — ABNORMAL HIGH (ref 8–23)
CO2: 24 mmol/L (ref 22–32)
Calcium: 8.7 mg/dL — ABNORMAL LOW (ref 8.9–10.3)
Chloride: 111 mmol/L (ref 98–111)
Creatinine, Ser: 0.98 mg/dL (ref 0.44–1.00)
GFR, Estimated: 54 mL/min — ABNORMAL LOW (ref >60)
Glucose, Bld: 104 mg/dL — ABNORMAL HIGH (ref 70–99)
Potassium: 3.9 mmol/L (ref 3.5–5.1)
Sodium: 140 mmol/L (ref 135–145)
Total Bilirubin: 0.5 mg/dL (ref 0.3–1.2)
Total Protein: 6.7 g/dL (ref 6.5–8.1)

## 2021-11-29 LAB — CBC WITH DIFFERENTIAL/PLATELET
Abs Immature Granulocytes: 0.11 10*3/uL — ABNORMAL HIGH (ref 0.00–0.07)
Basophils Absolute: 0.1 10*3/uL (ref 0.0–0.1)
Basophils Relative: 0 %
Eosinophils Absolute: 0 10*3/uL (ref 0.0–0.5)
Eosinophils Relative: 0 %
HCT: 34.8 % — ABNORMAL LOW (ref 36.0–46.0)
Hemoglobin: 10.8 g/dL — ABNORMAL LOW (ref 12.0–15.0)
Immature Granulocytes: 1 %
Lymphocytes Relative: 4 %
Lymphs Abs: 0.7 10*3/uL (ref 0.7–4.0)
MCH: 25.1 pg — ABNORMAL LOW (ref 26.0–34.0)
MCHC: 31 g/dL (ref 30.0–36.0)
MCV: 80.7 fL (ref 80.0–100.0)
Monocytes Absolute: 1.4 10*3/uL — ABNORMAL HIGH (ref 0.1–1.0)
Monocytes Relative: 8 %
Neutro Abs: 16.1 10*3/uL — ABNORMAL HIGH (ref 1.7–7.7)
Neutrophils Relative %: 87 %
Platelets: 458 10*3/uL — ABNORMAL HIGH (ref 150–400)
RBC: 4.31 MIL/uL (ref 3.87–5.11)
RDW: 20.5 % — ABNORMAL HIGH (ref 11.5–15.5)
WBC: 18.4 10*3/uL — ABNORMAL HIGH (ref 4.0–10.5)
nRBC: 0 % (ref 0.0–0.2)

## 2021-11-29 LAB — URINALYSIS, ROUTINE W REFLEX MICROSCOPIC
Bilirubin Urine: NEGATIVE
Glucose, UA: NEGATIVE mg/dL
Ketones, ur: NEGATIVE mg/dL
Nitrite: POSITIVE — AB
Protein, ur: 30 mg/dL — AB
Specific Gravity, Urine: 1.02 (ref 1.005–1.030)
pH: 6 (ref 5.0–8.0)

## 2021-11-29 LAB — LIPASE, BLOOD: Lipase: 45 U/L (ref 11–51)

## 2021-11-29 LAB — RESP PANEL BY RT-PCR (FLU A&B, COVID) ARPGX2
Influenza A by PCR: NEGATIVE
Influenza B by PCR: NEGATIVE
SARS Coronavirus 2 by RT PCR: NEGATIVE

## 2021-11-29 LAB — URINALYSIS, MICROSCOPIC (REFLEX)

## 2021-11-29 MED ORDER — KETOROLAC TROMETHAMINE 30 MG/ML IJ SOLN
30.0000 mg | Freq: Once | INTRAMUSCULAR | Status: AC
  Administered 2021-11-29: 30 mg via INTRAVENOUS
  Filled 2021-11-29: qty 1

## 2021-11-29 MED ORDER — KETOROLAC TROMETHAMINE 15 MG/ML IJ SOLN
15.0000 mg | Freq: Four times a day (QID) | INTRAMUSCULAR | Status: DC | PRN
  Administered 2021-11-29: 15 mg via INTRAVENOUS
  Filled 2021-11-29 (×2): qty 1

## 2021-11-29 MED ORDER — SODIUM CHLORIDE 0.9 % IV SOLN
1.0000 g | Freq: Once | INTRAVENOUS | Status: AC
  Administered 2021-11-29: 1 g via INTRAVENOUS
  Filled 2021-11-29: qty 10

## 2021-11-29 MED ORDER — SODIUM CHLORIDE 0.9 % IV SOLN: INTRAVENOUS | Status: AC

## 2021-11-29 MED ORDER — SODIUM CHLORIDE 0.9 % IV SOLN: INTRAVENOUS | Status: DC

## 2021-11-29 NOTE — ED Provider Notes (Signed)
[facility] EMERGENCY DEPARTMENT Provider Note   CSN: [id number] Arrival date & time: 11/29/21  1031     History  Chief Complaint  Patient presents with   Back Pain    [name] is a 88 y.o. female.   Back Pain Associated symptoms: no dysuria and no fever    Patient presents to the ER for evaluation of left-sided flank and back pain that started last evening.  Patient has history of hypertension kidney stones and is legally blind.  Patient states she has pain in her back frequently for a long period of time but last night she had more severe intense pain on that left flank region.  Pain does not radiate anywhere.  She was also having some nausea and vomiting and had pain in her abdomen.  The pain in her abdomen was on the left side but also seem to move towards the right.  She does have history of kidney stones and this feels similar.  She states she also has a history of a hernia but has not noticed any increased swelling.  EMS was called and she was given Toradol with improvement of her symptoms  Home Medications Prior to Admission medications   Medication Sig Start Date End Date Taking? Authorizing Provider  ALPHAGAN P 0.1 % SOLN Place 1 drop into both eyes 2 (two) times daily. 08/05/20  Yes [provider]  aspirin EC 81 MG tablet Take 81 mg by mouth daily. Swallow whole.   Yes [provider]  Calcium 600-200 MG-UNIT tablet Take 1 tablet by mouth daily.   Yes [provider]  cyanocobalamin (,VITAMIN B-12,) 1000 MCG/ML injection Inject 1,000 mcg into the muscle every 30 (thirty) days. 08/05/20  Yes [provider]  diltiazem (CARDIZEM CD) 180 MG 24 hr capsule Take 180 mg by mouth daily. 06/25/20  Yes [provider]  diltiazem (TIAZAC) 180 MG 24 hr capsule Take by mouth. 06/25/20  Yes [provider]  dorzolamide-timolol (COSOPT) 22.3-6.8 MG/ML ophthalmic solution Place 1 drop into both eyes 2 (two) times daily. 08/05/20  Yes  [provider]  ibuprofen (ADVIL) 200 MG tablet Take 200 mg by mouth every 6 (six) hours as needed.   Yes [provider]  Multiple Vitamins-Minerals (PRESERVISION/LUTEIN PO) Take by mouth.   Yes [provider]  Multiple Vitamins-Minerals (PRESERVISION/LUTEIN PO) Take 1 tablet by mouth 2 (two) times daily.   Yes [provider]  neomycin-polymyxin b-dexamethasone (MAXITROL) 3.5-10000-0.1 SUSP Place 1 drop into the left eye 2 (two) times daily. 08/12/20  Yes [provider]  omeprazole (PRILOSEC) 20 MG capsule Take 20 mg by mouth daily. 05/31/20  Yes [provider]  Polyethyl Glycol-Propyl Glycol (SYSTANE) 0.4-0.3 % SOLN Apply 1 drop to eye every 6 (six) hours as needed.   Yes [provider]  TOBRADEX ophthalmic ointment Place 1 application into both eyes 3 (three) times daily. 10/27/21  Yes [provider]  albuterol (VENTOLIN HFA) 108 (90 Base) MCG/ACT inhaler Inhale 2 puffs into the lungs every 6 (six) hours. Patient not taking: Reported on 11/29/2021 08/19/20   [place], Alvira Philips, DO  desloratadine (CLARINEX) 5 MG tablet Take 5 mg by mouth daily as needed.    [provider]  guaiFENesin-dextromethorphan (ROBITUSSIN DM) 100-10 MG/5ML syrup Take 10 mLs by mouth every 4 (four) hours as needed for cough. Patient not taking: Reported on 11/29/2021 08/19/20   [place], Alvira Philips, DO  levofloxacin (LEVAQUIN) 500 MG tablet Take 1 tablet (  500 mg total) by mouth daily. Patient not taking: Reported on 11/29/2021 09/19/20   Horton, Mayer Masker, MD  multivitamin-iron-minerals-folic acid (CENTRUM) chewable tablet Chew 1 tablet by mouth daily. Patient not taking: Reported on 11/29/2021    [provider]  sodium chloride (MURO 128) 5 % ophthalmic solution Place 1 drop into the left eye as needed for eye irritation. Patient not taking: Reported on 11/29/2021    [provider]  triamcinolone ointment (KENALOG) 0.1 % Apply  1 application topically 2 (two) times daily. Patient not taking: Reported on 11/29/2021 07/10/21   [provider]      Allergies    Iohexol, Azithromycin, Doxycycline, Gentamicin, Ibandronic acid, Nitrofurantoin, Penicillins, Prednisone, Strawberry (diagnostic), Sulfa antibiotics, Travatan [travoprost], and Xalatan [latanoprost]    Review of Systems   Review of Systems  Constitutional:  Negative for fever.  Genitourinary:  Negative for dysuria.  Musculoskeletal:  Positive for back pain.   Physical Exam Updated Vital Signs BP (!) 105/93    Pulse 77    Temp 98.1 F (36.7 C) (Axillary)    Resp (!) 24    SpO2 95%  Physical Exam Vitals and nursing note reviewed.  Constitutional:      Appearance: She is well-developed. She is not diaphoretic.     Comments: Elderly, frail  HENT:     Head: Normocephalic and atraumatic.     Right Ear: External ear normal.     Left Ear: External ear normal.  Eyes:     General: No scleral icterus.       Right eye: No discharge.        Left eye: No discharge.     Conjunctiva/sclera: Conjunctivae normal.  Neck:     Trachea: No tracheal deviation.  Cardiovascular:     Rate and Rhythm: Normal rate and regular rhythm.  Pulmonary:     Effort: Pulmonary effort is normal. No respiratory distress.     Breath sounds: Normal breath sounds. No stridor. No wheezing or rales.  Abdominal:     General: Bowel sounds are normal. There is no distension.     Palpations: Abdomen is soft. There is no mass.     Tenderness: There is no abdominal tenderness. There is left CVA tenderness. There is no guarding or rebound.     Hernia: No hernia is present.  Musculoskeletal:        General: No tenderness or deformity.     Cervical back: Neck supple.  Skin:    General: Skin is warm and dry.     Findings: No rash.  Neurological:     General: No focal deficit present.     Mental Status: She is alert.     Cranial Nerves: No cranial nerve deficit (no facial droop,  extraocular movements intact, no slurred speech).     Sensory: No sensory deficit.     Motor: No abnormal muscle tone or seizure activity.     Coordination: Coordination normal.  Psychiatric:        Mood and Affect: Mood normal.    ED Results / Procedures / Treatments   Labs (all labs ordered are listed, but only abnormal results are displayed) Labs Reviewed  COMPREHENSIVE METABOLIC PANEL - Abnormal; Notable for the following components:      Result Value   Glucose, Bld 104 (*)    BUN 26 (*)    Calcium 8.7 (*)    Albumin 3.0 (*)    GFR, Estimated 54 (*)    All  other components within normal limits  CBC WITH DIFFERENTIAL/PLATELET - Abnormal; Notable for the following components:   WBC 18.4 (*)    Hemoglobin 10.8 (*)    HCT 34.8 (*)    MCH 25.1 (*)    RDW 20.5 (*)    Platelets 458 (*)    Neutro Abs 16.1 (*)    Monocytes Absolute 1.4 (*)    Abs Immature Granulocytes 0.11 (*)    All other components within normal limits  LIPASE, BLOOD  URINALYSIS, ROUTINE W REFLEX MICROSCOPIC    EKG None  Radiology CT RENAL STONE STUDY  Result Date: 11/29/2021 CLINICAL DATA:  Patient brought in by [facility] for left sided back pain/flank pain that started last night. EMS states that the family said patient has a hx of kidney stones. Patient given 30mg  Toradol with some relief. EXAM: CT ABDOMEN AND PELVIS WITHOUT CONTRAST TECHNIQUE: Multidetector CT imaging of the abdomen and pelvis was performed following the standard protocol without IV contrast. RADIATION DOSE REDUCTION: This exam was performed according to the departmental dose-optimization program which includes automated exposure control, adjustment of the mA and/or kV according to patient size and/or use of iterative reconstruction technique. COMPARISON:  03/29/2008. FINDINGS: Lower chest: Small to moderate right and small left pleural effusions associated with dependent lower lobe opacity consistent with atelectasis. Hepatobiliary: No focal  liver abnormality is seen. No gallstones, gallbladder wall thickening, or biliary dilatation. Pancreas: Unremarkable. No pancreatic ductal dilatation or surrounding inflammatory changes. Spleen: Normal in size without focal abnormality. Adrenals/Urinary Tract: No adrenal masses. Large stone, 2 cm in size, extends from the inferior left renal pelvis to the ureteropelvic junction. There is associated moderate left hydronephrosis and left perinephric stranding. Small nonobstructing stones in the posterior lower pole of the left kidney. No right intrarenal stones or hydronephrosis. Low-density mass arises from the upper pole the right kidney measuring 2.8 cm. There is a 1.4 cm low-density mass arising from the upper pole of the left kidney and a 3 cm low-density mass arising from the lower pole of the left kidney. These are consistent with cysts. Left ureter below the ureteropelvic stone is normal in course and in caliber. No additional stones. Normal right ureter. Bladder is unremarkable. Stomach/Bowel: Moderate hiatal hernia, incompletely imaged. Stomach otherwise unremarkable. Small-bowel is unremarkable. Colon is mostly decompressed. No bowel wall thickening or inflammatory changes. No evidence of appendicitis. Vascular/Lymphatic: Dense aortic and branch vessel atherosclerotic calcifications. No aneurysm. No enlarged lymph nodes. Reproductive: Uterus and bilateral adnexa are unremarkable. Other: No hernia or ascites. Musculoskeletal: Loss vertebral body height throughout the visualized spine consistent with chronic fractures, most significantly of T12 and L1. L1 fracture is increased from the prior CT and the remaining fractures are new, but none appear acute. No bone lesions. IMPRESSION: 1. 2 cm stone at the left ureteropelvic junction causes moderate left hydronephrosis. 2. No other acute abnormality within the abdomen or pelvis. 3. Small nonobstructing stones in the lower pole of the left kidney. 4. Multiple  vertebral fractures from T11-L5, all presumed old, however most new since the prior CT. 5. Extensive aortic and branch vessel atherosclerotic calcifications. Electronically Signed   By: Amie Portlandavid  Ormond M.D.   On: 11/29/2021 13:33    Procedures Procedures    Medications Ordered in ED Medications  0.9 %  sodium chloride infusion ( Intravenous New Bag/Given 11/29/21 1353)    ED Course/ Medical Decision Making/ A&P Clinical Course as of 11/29/21 1620  Sat Nov 29, 2021  1408 CT scan images  and radiology report reviewed.  2 cm left kidney stone noted [JK]    Clinical Course User Index [JK] Linwood DibblesKnapp, Emrie Gayle, MD                           Medical Decision Making Amount and/or Complexity of Data Reviewed Labs: ordered. Radiology: ordered.  Risk Prescription drug management.   Patient presented with acute flank pain.  History of prior kidney stones. Patient noted to have significant leukocytosis on her laboratory tests.  Metabolic panel does not show any signs of acute kidney injury.  Lipase is normal and LFTs were unremarkable.  CT scan was performed and it did show evidence of a tooth Demeter kidney stone with moderate hydronephrosis.  Urinalysis still pending.  In-and-Out cath has been ordered.  Plan on assessing for signs of infection.  With the size of her stone we will plan on consultation with urology.  Patient's advanced age is a significant comorbidity.  At this time the patient is more comfortable.  Care turned over to Dr Deretha EmoryZackowski         Final Clinical Impression(s) / ED Diagnoses Final diagnoses:  Kidney stone  Hydronephrosis, unspecified hydronephrosis type    Rx / DC Orders ED Discharge Orders     None         Linwood DibblesKnapp, Shaquilla Kehres, MD 11/29/21 1620

## 2021-11-29 NOTE — H&P (Signed)
History and Physical  [name] [id number] DOB: 04/03/1926 DOA: 11/29/2021  Referring physician: Fredia Sorrow, MD PCP: Sharilyn Sites, MD  Patient coming from: Home  Chief Complaint: Left flank pain  HPI: [name] is a 88 y.o. female with medical history significant for hypertension, legally blind, atrial fibrillation, GERD, glaucoma who presents to the emergency department due to worsening left flank pain which started last night.  Patient is legally blind, she was unable to provide history, history was obtained from ED physician and daughter at bedside.  Per daughter, patient has chronic intermittent back pain, however, she complained of acute onset of left flank pain which started last evening, this morning around 6 AM, she requested for pain medication, and daughter gave Tylenol and she was able to rest for a few hours under recliner prior to using the restroom.  Left flank pain returned and patient requested to be taken to the hospital due to the intensity of the pain.  She endorsed some chills, but denies burning sensation on urination, urinary frequency or urgency, she also denies chest pain, shortness of breath, diarrhea, nausea or vomiting.  Patient lives with daughter and ambulates with a Pooley at baseline.  ED Course:  In the emergency department, she was intermittently bradycardic, BP was 126/61, but other vital signs were within normal range.  Work-up in the ED showed leukocytosis, normocytic anemia, thrombocytosis and normal BMP except for elevated BUN at 26 and CBG at 104.  Urinalysis was positive for nitrites and small leukocytes.  Lipase 45 CT abdomen and pelvis without contrast showed 2 cm stone at the left ureteropelvic junction causes moderate left hydronephrosis. She was treated with IV ceftriaxone and IV Toradol.  Urologist at [facility] (Dr. Alinda Money) was consulted and recommended admitting patient to hospitalist service at [facility] in case they need to do  an intervention.  Hospitalist was asked to admit patient for further evaluation and management.  Review of Systems: A full 10 point Review of Systems was done, except as stated above, all other Review of systems were negative.  Past Medical History:  Diagnosis Date   Blind    COVID    HOH (hard of hearing)    Hypertension    Legally blind    History reviewed. No pertinent surgical history.  Social History:  reports that she has quit smoking. She has never used smokeless tobacco. No history on file for alcohol use and drug use.   Allergies  Allergen Reactions   Iohexol Itching, Swelling and Rash    pt allergic to prednisone she needs IV premeds   Azithromycin    Doxycycline    Gentamicin    Ibandronic Acid    Nitrofurantoin    Penicillins    Prednisone    Strawberry (Diagnostic)    Sulfa Antibiotics    Travatan [Travoprost]    Xalatan [Latanoprost]     No family history on file.   Prior to Admission medications   Medication Sig Start Date End Date Taking? Authorizing Provider  ALPHAGAN P 0.1 % SOLN Place 1 drop into both eyes 2 (two) times daily. 08/05/20  Yes [provider]  aspirin EC 81 MG tablet Take 81 mg by mouth daily. Swallow whole.   Yes [provider]  Calcium 600-200 MG-UNIT tablet Take 1 tablet by mouth daily.   Yes [provider]  cyanocobalamin (,VITAMIN B-12,) 1000 MCG/ML injection Inject 1,000 mcg into the muscle every 30 (thirty) days. 08/05/20  Yes [provider]  diltiazem (CARDIZEM CD) 180 MG 24 hr capsule Take 180 mg by mouth daily. 06/25/20  Yes [provider]  diltiazem (TIAZAC) 180 MG 24 hr capsule Take by mouth. 06/25/20  Yes [provider]  dorzolamide-timolol (COSOPT) 22.3-6.8 MG/ML ophthalmic solution Place 1 drop into both eyes 2 (two) times daily. 08/05/20  Yes [provider]  ibuprofen (ADVIL) 200 MG tablet Take 200 mg by mouth every 6 (six) hours as needed.   Yes [provider]  Multiple Vitamins-Minerals (PRESERVISION/LUTEIN PO) Take by mouth.   Yes [provider]  Multiple Vitamins-Minerals (PRESERVISION/LUTEIN PO) Take 1 tablet by mouth 2 (two) times daily.   Yes [provider]  neomycin-polymyxin b-dexamethasone (MAXITROL) 3.5-10000-0.1 SUSP Place 1 drop into the left eye 2 (two) times daily. 08/12/20  Yes [provider]  omeprazole (PRILOSEC) 20 MG capsule Take 20 mg by mouth daily. 05/31/20  Yes [provider]  Polyethyl Glycol-Propyl Glycol (SYSTANE) 0.4-0.3 % SOLN Apply 1 drop to eye every 6 (six) hours as needed.   Yes [provider]  TOBRADEX ophthalmic ointment Place 1 application into both eyes 3 (three) times daily. 10/27/21  Yes [provider]  albuterol (VENTOLIN HFA) 108 (90 Base) MCG/ACT inhaler Inhale 2 puffs into the lungs every 6 (six) hours. Patient not taking: Reported on 11/29/2021 08/19/20   [place] Donnamarie Poag, DO  desloratadine (CLARINEX) 5 MG tablet Take 5 mg by mouth daily as needed.    [provider]  guaiFENesin-dextromethorphan (ROBITUSSIN DM) 100-10 MG/5ML syrup Take 10 mLs by mouth every 4 (four) hours as needed for cough. Patient not taking: Reported on 11/29/2021 08/19/20   [place] Donnamarie Poag, DO  levofloxacin (LEVAQUIN) 500 MG tablet Take 1 tablet (500 mg total) by mouth daily. Patient not taking: Reported on 11/29/2021 09/19/20   Horton, Barbette Hair, MD  multivitamin-iron-minerals-folic acid (CENTRUM) chewable tablet Chew 1 tablet by mouth daily. Patient not taking: Reported on 11/29/2021    [provider]  sodium chloride (MURO 128) 5 % ophthalmic solution Place 1 drop into the left eye as needed for eye irritation. Patient not taking: Reported on 11/29/2021    [provider]  triamcinolone ointment (KENALOG) 0.1 % Apply 1 application topically 2 (two) times daily. Patient not taking: Reported on 11/29/2021 07/10/21   [provider]     Physical Exam: BP (!) 166/107    Pulse 97    Temp 98.1 F (36.7 C) (Axillary)    Resp 20    SpO2 (!) 88%   General: 88 y.o. year-old female well developed well nourished in no acute distress.  Alert and oriented x3. HEENT: Dry mucous membrane.  NCAT, EOMI Neck: Supple, trachea medial Cardiovascular: Irregular rate and rhythm with no rubs or gallops.  No thyromegaly or JVD noted. 2/4 pulses in all 4 extremities. Respiratory: Clear to auscultation with no wheezes or rales. Good inspiratory effort. Abdomen: Soft, nontender nondistended with normal bowel sounds x4 quadrants.  Left CVA tenderness. Muskuloskeletal: +2 pedal and bilateral lower extremity edema up to 1/3 shin.  No cyanosis oral clubbing noted bilaterally Neuro: CN II-XII intact, sensation, reflexes intact Skin: No ulcerative lesions noted or rashes Psychiatry: Mood is appropriate for condition and setting          Labs on Admission:  Basic Metabolic Panel: Recent Labs  Lab 11/29/21 1322  NA 140  K 3.9  CL 111  CO2 24  GLUCOSE 104*  BUN 26*  CREATININE 0.98  CALCIUM 8.7*   Liver Function Tests: Recent Labs  Lab 11/29/21 1322  AST 19  ALT 12  ALKPHOS 63  BILITOT 0.5  PROT 6.7  ALBUMIN 3.0*   Recent Labs  Lab 11/29/21 1322  LIPASE 45   No results for input(s): AMMONIA in the last 168 hours. CBC: Recent Labs  Lab 11/29/21 1322  WBC 18.4*  NEUTROABS 16.1*  HGB 10.8*  HCT 34.8*  MCV 80.7  PLT 458*   Cardiac Enzymes: No results for input(s): CKTOTAL, CKMB, CKMBINDEX, TROPONINI in the last 168 hours.  BNP (last 3 results) No results for input(s): BNP in the last 8760 hours.  ProBNP (last 3 results) No results for input(s): PROBNP in the last 8760 hours.  CBG: No results for input(s): GLUCAP in the last 168 hours.  Radiological Exams on Admission: CT RENAL STONE STUDY  Result Date: 11/29/2021 CLINICAL DATA:  Patient brought in by [place] for left sided back pain/flank pain that started  last night. EMS states that the family said patient has a hx of kidney stones. Patient given 30mg  Toradol with some relief. EXAM: CT ABDOMEN AND PELVIS WITHOUT CONTRAST TECHNIQUE: Multidetector CT imaging of the abdomen and pelvis was performed following the standard protocol without IV contrast. RADIATION DOSE REDUCTION: This exam was performed according to the departmental dose-optimization program which includes automated exposure control, adjustment of the mA and/or kV according to patient size and/or use of iterative reconstruction technique. COMPARISON:  03/29/2008. FINDINGS: Lower chest: Small to moderate right and small left pleural effusions associated with dependent lower lobe opacity consistent with atelectasis. Hepatobiliary: No focal liver abnormality is seen. No gallstones, gallbladder wall thickening, or biliary dilatation. Pancreas: Unremarkable. No pancreatic ductal dilatation or surrounding inflammatory changes. Spleen: Normal in size without focal abnormality. Adrenals/Urinary Tract: No adrenal masses. Large stone, 2 cm in size, extends from the inferior left renal pelvis to the ureteropelvic junction. There is associated moderate left hydronephrosis and left perinephric stranding. Small nonobstructing stones in the posterior lower pole of the left kidney. No right intrarenal stones or hydronephrosis. Low-density mass arises from the upper pole the right kidney measuring 2.8 cm. There is a 1.4 cm low-density mass arising from the upper pole of the left kidney and a 3 cm low-density mass arising from the lower pole of the left kidney. These are consistent with cysts. Left ureter below the ureteropelvic stone is normal in course and in caliber. No additional stones. Normal right ureter. Bladder is unremarkable. Stomach/Bowel: Moderate hiatal hernia, incompletely imaged. Stomach otherwise unremarkable. Small-bowel is unremarkable. Colon is mostly decompressed. No bowel wall thickening or inflammatory  changes. No evidence of appendicitis. Vascular/Lymphatic: Dense aortic and branch vessel atherosclerotic calcifications. No aneurysm. No enlarged lymph nodes. Reproductive: Uterus and bilateral adnexa are unremarkable. Other: No hernia or ascites. Musculoskeletal: Loss vertebral body height throughout the visualized spine consistent with chronic fractures, most significantly of T12 and L1. L1 fracture is increased from the prior CT and the remaining fractures are new, but none appear acute. No bone lesions. IMPRESSION: 1. 2 cm stone at the left ureteropelvic junction causes moderate left hydronephrosis. 2. No other acute abnormality within the abdomen or pelvis. 3. Small nonobstructing stones in the lower pole of the left kidney. 4. Multiple vertebral fractures from T11-L5, all presumed old, however most new since the prior CT. 5. Extensive aortic and branch vessel atherosclerotic calcifications. Electronically Signed   By: Lajean Manes M.D.   On: 11/29/2021 13:33    EKG: I independently  viewed the EKG done and my findings are as followed: EKG was not done in the ED  Assessment/Plan Present on Admission: **None**  Principal Problem:   Left nephrolithiasis Active Problems:   Acute lower UTI   Leukocytosis   Thrombocytosis   Normocytic anemia   Essential hypertension   GERD (gastroesophageal reflux disease)   Glaucoma   Dehydration  Left nephrolithiasis CT abdomen and pelvis without contrast showed 2 cm stone at the left ureteropelvic junction causes moderate left hydronephrosis. She was treated with IV Toradol, we shall continue with same at this time for pain Continue IV hydration Urologist at [facility] (Dr. Alinda Money) was consulted and recommended admitting patient to hospitalist service at [facility] in case they need to do an intervention.  Presumed UTI POA Patient was empirically started on IV ceftriaxone, we shall continue with same at this time with plan to de-escalate/discontinue  based on urine culture and procalcitonin  Dehydration Continue IV hydration  Leukocytosis possibly secondary to infectious vs reactive process WBC 18.4, continue to monitor WBC with morning labs  Chronic thrombocytosis  Platelets 458, continue to monitor platelet levels monitor labs  Normocytic anemia Hemoglobin 10.8 (this was 10.9 in November 2021) Continue to monitor H/H with morning labs  Essential hypertension Continue Cardizem  Chronic atrial fibrillation CHADs-Vasc score is at least 4 Continue Cardizem, aspirin Patient is not on any anticoagulant  GERD Continue Protonix  Glaucoma Continue Cosopt, Maxitrol  DVT prophylaxis: SCDs  Code Status: DNR  Family Communication: Daughter at bedside (all questions answered to satisfaction)  Disposition Plan:  Patient is from:                        home Anticipated DC to:                   SNF or family members home Anticipated DC date:               2-3 days Anticipated DC barriers:          Patient requires inpatient management due to left-sided nephrolithiasis pending urology consult.    Consults called: Urology  Admission status: Inpatient    Bernadette Hoit MD [facility]  11/29/2021, 8:18 PM

## 2021-11-29 NOTE — ED Notes (Signed)
[name] verbalized [name] is deceased and [name] now has POA papers. Dawn and Pt verbalized pt is a DNR.

## 2021-11-29 NOTE — ED Triage Notes (Signed)
Patient brought in by [facility] for left sided back pain/flank pain that started last night.  EMS states that the family said patient has a hx of kidney stones.  Patient given 30mg  Toradol with some relief.

## 2021-11-29 NOTE — ED Provider Notes (Addendum)
Patient with recurrent flank pain.  CT showed a 2 cm stone at the ureter or renal pelvis junction with hydronephrosis.  Urinalysis suggestive of possible urinary tract infection with positive nitrite.  Will give Rocephin IV.  Will contact urology.  We will give some more Toradol to help with the pain.  Patient's GFR was in the 50s.  Does have a significant leukocytosis with a white count of 18.4.  Will discuss with urology due to this 2 cm stone at the left ureteropelvic junction.   Vanetta Mulders, MD 11/29/21 [phone]  I urology Dr. Laverle Patter is recommending admission down to [facility] in case they need to do an intervention.  Needs admission for pain control and also to make sure infection does not get worse.  With patient being 88 years old.    Vanetta Mulders, MD 11/29/21 Andres Labrum    Vanetta Mulders, MD 11/29/21 Jerene Bears

## 2021-11-30 ENCOUNTER — Encounter (HOSPITAL_COMMUNITY)
Admission: EM | Disposition: A | Discharge status: Home / Self Care | Payer: Self-pay | Source: Home / Self Care | Attending: Internal Medicine

## 2021-11-30 ENCOUNTER — Inpatient Hospital Stay (HOSPITAL_COMMUNITY): Payer: Medicare Other | Admitting: Anesthesiology

## 2021-11-30 ENCOUNTER — Inpatient Hospital Stay (HOSPITAL_COMMUNITY): Payer: Medicare Other

## 2021-11-30 DIAGNOSIS — L89152 Pressure ulcer of sacral region, stage 2: Secondary | ICD-10-CM

## 2021-11-30 DIAGNOSIS — N390 Urinary tract infection, site not specified: Secondary | ICD-10-CM | POA: Diagnosis not present

## 2021-11-30 DIAGNOSIS — N1330 Unspecified hydronephrosis: Secondary | ICD-10-CM

## 2021-11-30 DIAGNOSIS — L89101 Pressure ulcer of unspecified part of back, stage 1: Secondary | ICD-10-CM | POA: Diagnosis present

## 2021-11-30 DIAGNOSIS — I482 Chronic atrial fibrillation, unspecified: Secondary | ICD-10-CM | POA: Diagnosis not present

## 2021-11-30 HISTORY — PX: CYSTOSCOPY W/ URETERAL STENT PLACEMENT: SHX1429

## 2021-11-30 LAB — COMPREHENSIVE METABOLIC PANEL
ALT: 11 U/L (ref 0–44)
AST: 34 U/L (ref 15–41)
Albumin: 2.2 g/dL — ABNORMAL LOW (ref 3.5–5.0)
Alkaline Phosphatase: 64 U/L (ref 38–126)
Anion gap: 10 (ref 5–15)
BUN: 29 mg/dL — ABNORMAL HIGH (ref 8–23)
CO2: 15 mmol/L — ABNORMAL LOW (ref 22–32)
Calcium: 7.4 mg/dL — ABNORMAL LOW (ref 8.9–10.3)
Chloride: 110 mmol/L (ref 98–111)
Creatinine, Ser: 1.25 mg/dL — ABNORMAL HIGH (ref 0.44–1.00)
GFR, Estimated: 40 mL/min — ABNORMAL LOW (ref >60)
Glucose, Bld: 90 mg/dL (ref 70–99)
Potassium: 4 mmol/L (ref 3.5–5.1)
Sodium: 135 mmol/L (ref 135–145)
Total Bilirubin: 0.8 mg/dL (ref 0.3–1.2)
Total Protein: 5.4 g/dL — ABNORMAL LOW (ref 6.5–8.1)

## 2021-11-30 LAB — CBC
HCT: 30.5 % — ABNORMAL LOW (ref 36.0–46.0)
Hemoglobin: 9.3 g/dL — ABNORMAL LOW (ref 12.0–15.0)
MCH: 24.6 pg — ABNORMAL LOW (ref 26.0–34.0)
MCHC: 30.5 g/dL (ref 30.0–36.0)
MCV: 80.7 fL (ref 80.0–100.0)
Platelets: 292 10*3/uL (ref 150–400)
RBC: 3.78 MIL/uL — ABNORMAL LOW (ref 3.87–5.11)
RDW: 20.8 % — ABNORMAL HIGH (ref 11.5–15.5)
WBC: 24.3 10*3/uL — ABNORMAL HIGH (ref 4.0–10.5)
nRBC: 0.1 % (ref 0.0–0.2)

## 2021-11-30 LAB — GRAM STAIN

## 2021-11-30 LAB — SURGICAL PCR SCREEN
MRSA, PCR: POSITIVE — AB
Staphylococcus aureus: POSITIVE — AB

## 2021-11-30 LAB — PROCALCITONIN: Procalcitonin: 17.6 ng/mL

## 2021-11-30 LAB — MAGNESIUM: Magnesium: 1.3 mg/dL — ABNORMAL LOW (ref 1.7–2.4)

## 2021-11-30 LAB — PHOSPHORUS: Phosphorus: 3.2 mg/dL (ref 2.5–4.6)

## 2021-11-30 SURGERY — CYSTOSCOPY, WITH RETROGRADE PYELOGRAM AND URETERAL STENT INSERTION
Anesthesia: General | Site: Ureter | Laterality: Left

## 2021-11-30 SURGERY — CYSTOSCOPY, WITH RETROGRADE PYELOGRAM AND URETERAL STENT INSERTION
Anesthesia: General | Laterality: Left

## 2021-11-30 MED ORDER — PROPOFOL 500 MG/50ML IV EMUL
INTRAVENOUS | Status: DC | PRN
  Administered 2021-11-30: 20 mg via INTRAVENOUS
  Administered 2021-11-30: 120 mg via INTRAVENOUS

## 2021-11-30 MED ORDER — FENTANYL CITRATE PF 50 MCG/ML IJ SOSY: 25.0000 ug | PREFILLED_SYRINGE | INTRAMUSCULAR | Status: DC | PRN

## 2021-11-30 MED ORDER — NEOMYCIN-POLYMYXIN-DEXAMETH 3.5-10000-0.1 OP SUSP
1.0000 [drp] | Freq: Two times a day (BID) | OPHTHALMIC | Status: DC
  Administered 2021-11-30 – 2021-12-03 (×7): 1 [drp] via OPHTHALMIC
  Filled 2021-11-30: qty 5

## 2021-11-30 MED ORDER — PANTOPRAZOLE SODIUM 40 MG PO TBEC
40.0000 mg | DELAYED_RELEASE_TABLET | Freq: Every day | ORAL | Status: DC
  Administered 2021-12-01 – 2021-12-03 (×3): 40 mg via ORAL
  Filled 2021-11-30 (×3): qty 1

## 2021-11-30 MED ORDER — SODIUM CHLORIDE 0.9 % IV SOLN
1.0000 g | INTRAVENOUS | Status: DC
  Administered 2021-11-30 – 2021-12-02 (×3): 1 g via INTRAVENOUS
  Filled 2021-11-30 (×4): qty 10

## 2021-11-30 MED ORDER — PHENYLEPHRINE 40 MCG/ML (10ML) SYRINGE FOR IV PUSH (FOR BLOOD PRESSURE SUPPORT)
PREFILLED_SYRINGE | INTRAVENOUS | Status: DC | PRN
  Administered 2021-11-30 (×3): 120 ug via INTRAVENOUS
  Administered 2021-11-30: 80 ug via INTRAVENOUS

## 2021-11-30 MED ORDER — DILTIAZEM HCL ER COATED BEADS 180 MG PO CP24
180.0000 mg | ORAL_CAPSULE | Freq: Every day | ORAL | Status: DC
  Administered 2021-12-01 – 2021-12-03 (×3): 180 mg via ORAL
  Filled 2021-11-30 (×3): qty 1

## 2021-11-30 MED ORDER — PHENYLEPHRINE 40 MCG/ML (10ML) SYRINGE FOR IV PUSH (FOR BLOOD PRESSURE SUPPORT)
PREFILLED_SYRINGE | INTRAVENOUS | Status: AC
  Filled 2021-11-30: qty 10

## 2021-11-30 MED ORDER — DEXAMETHASONE SODIUM PHOSPHATE 10 MG/ML IJ SOLN
INTRAMUSCULAR | Status: DC | PRN
Start: 2021-11-30 — End: 2021-11-30
  Administered 2021-11-30: 5 mg via INTRAVENOUS

## 2021-11-30 MED ORDER — EPHEDRINE SULFATE-NACL 50-0.9 MG/10ML-% IV SOSY
PREFILLED_SYRINGE | INTRAVENOUS | Status: DC | PRN
  Administered 2021-11-30: 5 mg via INTRAVENOUS
  Administered 2021-11-30: 10 mg via INTRAVENOUS

## 2021-11-30 MED ORDER — LACTATED RINGERS IV SOLN: INTRAVENOUS | Status: DC | PRN

## 2021-11-30 MED ORDER — ONDANSETRON HCL 4 MG/2ML IJ SOLN
INTRAMUSCULAR | Status: DC | PRN
  Administered 2021-11-30 (×2): 4 mg via INTRAVENOUS

## 2021-11-30 MED ORDER — ACETAMINOPHEN 500 MG PO TABS
1000.0000 mg | ORAL_TABLET | Freq: Once | ORAL | Status: AC
  Administered 2021-11-30: 1000 mg via ORAL

## 2021-11-30 MED ORDER — ACETAMINOPHEN 500 MG PO TABS
ORAL_TABLET | ORAL | Status: AC
  Filled 2021-11-30: qty 2

## 2021-11-30 MED ORDER — ENSURE ENLIVE PO LIQD
237.0000 mL | Freq: Two times a day (BID) | ORAL | Status: DC
  Administered 2021-12-01: 237 mL via ORAL

## 2021-11-30 MED ORDER — CHLORHEXIDINE GLUCONATE CLOTH 2 % EX PADS
6.0000 | MEDICATED_PAD | Freq: Every day | CUTANEOUS | Status: DC
  Administered 2021-12-01 – 2021-12-03 (×3): 6 via TOPICAL

## 2021-11-30 MED ORDER — STERILE WATER FOR IRRIGATION IR SOLN
Status: DC | PRN
  Administered 2021-11-30: 3000 mL

## 2021-11-30 MED ORDER — IOHEXOL 300 MG/ML  SOLN
INTRAMUSCULAR | Status: DC | PRN
  Administered 2021-11-30: 8 mL

## 2021-11-30 MED ORDER — PROPOFOL 10 MG/ML IV BOLUS
INTRAVENOUS | Status: AC
  Filled 2021-11-30: qty 20

## 2021-11-30 MED ORDER — SODIUM CHLORIDE 0.9 % IV BOLUS
500.0000 mL | Freq: Once | INTRAVENOUS | Status: AC
  Administered 2021-11-30: 500 mL via INTRAVENOUS

## 2021-11-30 MED ORDER — LIDOCAINE 2% (20 MG/ML) 5 ML SYRINGE
INTRAMUSCULAR | Status: DC | PRN
  Administered 2021-11-30: 50 mg via INTRAVENOUS

## 2021-11-30 MED ORDER — ASPIRIN EC 81 MG PO TBEC
81.0000 mg | DELAYED_RELEASE_TABLET | Freq: Every day | ORAL | Status: DC
Start: 2021-11-30 — End: 2021-12-03
  Administered 2021-12-01 – 2021-12-03 (×3): 81 mg via ORAL
  Filled 2021-11-30 (×4): qty 1

## 2021-11-30 MED ORDER — DORZOLAMIDE HCL-TIMOLOL MAL 2-0.5 % OP SOLN
1.0000 [drp] | Freq: Two times a day (BID) | OPHTHALMIC | Status: DC
  Administered 2021-11-30 – 2021-12-03 (×7): 1 [drp] via OPHTHALMIC
  Filled 2021-11-30: qty 10

## 2021-11-30 MED ORDER — MUPIROCIN 2 % EX OINT
1.0000 "application " | TOPICAL_OINTMENT | Freq: Two times a day (BID) | CUTANEOUS | Status: DC
  Administered 2021-11-30 – 2021-12-03 (×6): 1 via NASAL
  Filled 2021-11-30 (×2): qty 22

## 2021-11-30 MED ORDER — FENTANYL CITRATE (PF) 100 MCG/2ML IJ SOLN
INTRAMUSCULAR | Status: DC | PRN
  Administered 2021-11-30: 25 ug via INTRAVENOUS

## 2021-11-30 MED ORDER — FENTANYL CITRATE (PF) 100 MCG/2ML IJ SOLN
INTRAMUSCULAR | Status: AC
  Filled 2021-11-30: qty 2

## 2021-11-30 SURGICAL SUPPLY — 13 items
BAG URO CATCHER STRL LF (MISCELLANEOUS) ×3 IMPLANT
CATH URETL OPEN END 6FR 70 (CATHETERS) IMPLANT
CLOTH BEACON ORANGE TIMEOUT ST (SAFETY) ×3 IMPLANT
GLOVE SURG ENC TEXT LTX SZ7.5 (GLOVE) ×3 IMPLANT
GOWN STRL REUS W/TWL LRG LVL3 (GOWN DISPOSABLE) ×6 IMPLANT
GUIDEWIRE STR DUAL SENSOR (WIRE) ×3 IMPLANT
GUIDEWIRE ZIPWRE .038 STRAIGHT (WIRE) IMPLANT
KIT TURNOVER KIT A (KITS) IMPLANT
MANIFOLD NEPTUNE II (INSTRUMENTS) ×3 IMPLANT
PACK CYSTO (CUSTOM PROCEDURE TRAY) ×3 IMPLANT
STENT URET 6FRX24 CONTOUR (STENTS) ×1 IMPLANT
TUBING CONNECTING 10 (TUBING) ×3 IMPLANT
TUBING UROLOGY SET (TUBING) IMPLANT

## 2021-11-30 NOTE — Assessment & Plan Note (Signed)
Wound care.

## 2021-11-30 NOTE — Op Note (Signed)
Preoperative diagnosis:  Left UPJ calculus   Postoperative diagnosis:  Left UPJ calculus   Procedure:  Cystoscopy Left ureteral stent placement (6 x24 - no string)  Left retrograde pyelography with interpretation   Surgeon: Pryor Curia. M.D.  Resident: Dr. Bishop Limbo  Anesthesia: General  Complications: None  Intraoperative findings: Left retrograde pyelography was performed with Omnipaque contrast and a ureteral catheter.  This demonstrated a large filling defect within the left renal pelvis consistent with the patient's known stone.  No significant hydronephrosis was noted.  EBL: Minimal  Specimens: None  Indication: [name] is a 87 y.o. patient with a large left UPJ calculus with uncontrolled pain and concern for possible infection. After reviewing the management options for treatment, he elected to proceed with the above surgical procedure(s). We have discussed the potential benefits and risks of the procedure, side effects of the proposed treatment, the likelihood of the patient achieving the goals of the procedure, and any potential problems that might occur during the procedure or recuperation. Informed consent has been obtained.  Description of procedure:  The patient was taken to the operating room and general anesthesia was induced.  The patient was placed in the dorsal lithotomy position, prepped and draped in the usual sterile fashion, and preoperative antibiotics were administered. A preoperative time-out was performed.   Cystourethroscopy was performed.  The patients urethra was examined and was normal. The bladder was then systematically examined in its entirety. There was no evidence for any bladder tumors, stones, or other mucosal pathology.  The urine did appear to be quite cloudy.  A urine specimen was obtained for culture.  Attention then turned to the left ureteral orifice and a ureteral catheter was used to intubate the ureteral orifice.   Omnipaque contrast was injected through the ureteral catheter and a retrograde pyelogram was performed with findings as dictated above.  A 0.38 sensor guidewire was then advanced up the left ureter into the renal pelvis under fluoroscopic guidance.  The wire was then backloaded through the cystoscope and a ureteral stent was advance over the wire using Seldinger technique.  The stent was positioned appropriately under fluoroscopic and cystoscopic guidance.  The wire was then removed with an adequate stent curl noted in the renal pelvis as well as in the bladder.  The bladder was then emptied and the procedure ended.  The patient appeared to tolerate the procedure well and without complications.  The patient was able to be awakened and transferred to the recovery unit in satisfactory condition.    Pryor Curia MD

## 2021-11-30 NOTE — Anesthesia Postprocedure Evaluation (Signed)
Anesthesia Post Note  Patient: [name]  Procedure(s) Performed: CYSTOSCOPY WITH RETROGRADE PYELOGRAM/URETERAL STENT PLACEMENT (Left: Ureter)     Patient location during evaluation: PACU Anesthesia Type: General Level of consciousness: awake and alert, patient cooperative and oriented Pain management: pain level controlled Vital Signs Assessment: post-procedure vital signs reviewed and stable Respiratory status: spontaneous breathing, nonlabored ventilation, respiratory function stable and patient connected to nasal cannula oxygen Cardiovascular status: blood pressure returned to baseline and stable Postop Assessment: no apparent nausea or vomiting Anesthetic complications: no   No notable events documented.  Last Vitals:  Vitals:   11/30/21 1445 11/30/21 1500  BP: 91/63 91/60  Pulse: 82 91  Resp: (!) 24 20  Temp:    SpO2: 99% 97%    Last Pain:  Vitals:   11/30/21 1500  TempSrc:   PainSc: 0-No pain                 [name],E. Brnadon Eoff

## 2021-11-30 NOTE — Assessment & Plan Note (Signed)
-  Rate controlled. Not on anticoagulation 

## 2021-11-30 NOTE — Progress Notes (Signed)
Pt's BP were 83/48 (59) and 85/49 (60) around 0430. Received an order of NS bolus and started infusing. Will monitor and recheck BP after infusing done.

## 2021-11-30 NOTE — Anesthesia Preprocedure Evaluation (Addendum)
Anesthesia Evaluation  Patient identified by MRN, date of birth, ID band Patient awake    Reviewed: Allergy & Precautions, NPO status , Patient's Chart, lab work & pertinent test results  History of Anesthesia Complications Negative for: history of anesthetic complications  Airway Mallampati: I  TM Distance: >3 FB Neck ROM: Full    Dental  (+) Edentulous Upper, Edentulous Lower   Pulmonary COPD,  COPD inhaler and oxygen dependent, former smoker,  11/29/2021 SARS coronavirus NEG   breath sounds clear to auscultation       Cardiovascular hypertension, Pt. on medications (-) angina Rhythm:Regular Rate:Normal     Neuro/Psych Glaucoma: legally blind    GI/Hepatic Neg liver ROS, GERD  Controlled,  Endo/Other  negative endocrine ROS  Renal/GU stones     Musculoskeletal  (+) Arthritis ,   Abdominal   Peds  Hematology  (+) Blood dyscrasia (Hb 10.8), anemia ,   Anesthesia Other Findings   Reproductive/Obstetrics                            Anesthesia Physical Anesthesia Plan  ASA: 3  Anesthesia Plan: General   Post-op Pain Management: Tylenol PO (pre-op)   Induction: Intravenous  PONV Risk Score and Plan: 3 and Ondansetron, Dexamethasone and Treatment may vary due to age or medical condition  Airway Management Planned: LMA  Additional Equipment: None  Intra-op Plan:   Post-operative Plan:   Informed Consent: I have reviewed the patients History and Physical, chart, labs and discussed the procedure including the risks, benefits and alternatives for the proposed anesthesia with the patient or authorized representative who has indicated his/her understanding and acceptance.   Patient has DNR.  Discussed DNR with patient, Discussed DNR with power of attorney and Suspend DNR.   Consent reviewed with POA  Plan Discussed with: CRNA and Surgeon  Anesthesia Plan Comments: (Discussed with  patient and her daughter)       Anesthesia Quick Evaluation

## 2021-11-30 NOTE — Assessment & Plan Note (Signed)
Continue PPI ?

## 2021-11-30 NOTE — Consult Note (Signed)
Urology Consult Note   Requesting Attending Physician:  Hollice Espy, MD Service Providing Consult: Urology   Reason for Consult:  Nephrolithiasis  HPI: [name] is seen in consultation for reasons noted above at the request of Hollice Espy, MD for evaluation of ureterolithiasis.  88 year old female known to our urology team for nephrolithiasis.  She has a past medical history of hypertension, legally blind, atrial fibrillation, GERD, glaucoma.  She had worsening left flank pain of 1 day onset.  Uncontrolled at home with Tylenol.  Denies lower urinary tract symptoms.  Denies chest pain, shortness of breath, nausea vomiting, diarrhea.  Presents with her daughter. Has known urolithiasis.  Last seen in clinic about 10 years ago.  Patient was able to provide history.  Her daughter is her Oceanographer.   Past Medical History: Past Medical History:  Diagnosis Date   Blind    COVID    HOH (hard of hearing)    Hypertension    Legally blind     Past Surgical History:  History reviewed. No pertinent surgical history.  Medication: Current Facility-Administered Medications  Medication Dose Route Frequency Provider Last Rate Last Admin   0.9 %  sodium chloride infusion   Intravenous Continuous Adefeso, Oladapo, DO   Stopped at 11/29/21 1630   0.9 %  sodium chloride infusion   Intravenous Continuous Adefeso, Oladapo, DO 50 mL/hr at 11/29/21 2316 New Bag at 11/29/21 2316   aspirin EC tablet 81 mg  81 mg Oral Daily Adefeso, Oladapo, DO       cefTRIAXone (ROCEPHIN) 1 g in sodium chloride 0.9 % 100 mL IVPB  1 g Intravenous Q24H Adefeso, Oladapo, DO       diltiazem (CARDIZEM CD) 24 hr capsule 180 mg  180 mg Oral Daily Adefeso, Oladapo, DO       dorzolamide-timolol (COSOPT) 22.3-6.8 MG/ML ophthalmic solution 1 drop  1 drop Both Eyes BID Adefeso, Oladapo, DO       feeding supplement (ENSURE ENLIVE / ENSURE PLUS) liquid 237 mL  237 mL Oral BID BM Blount, Andi Devon T, NP        ketorolac (TORADOL) 15 MG/ML injection 15 mg  15 mg Intravenous Q6H PRN Adefeso, Oladapo, DO   15 mg at 11/29/21 2325   neomycin-polymyxin b-dexamethasone (MAXITROL) ophthalmic suspension 1 drop  1 drop Left Eye BID Adefeso, Oladapo, DO       pantoprazole (PROTONIX) EC tablet 40 mg  40 mg Oral Daily Adefeso, Oladapo, DO        Allergies: Allergies  Allergen Reactions   Iohexol Itching, Swelling and Rash    pt allergic to prednisone she needs IV premeds   Azithromycin    Doxycycline    Gentamicin    Ibandronic Acid    Nitrofurantoin    Penicillins    Prednisone    Strawberry (Diagnostic)    Sulfa Antibiotics    Travatan [Travoprost]    Xalatan [Latanoprost]     Social History: Social History   Tobacco Use   Smoking status: Former   Smokeless tobacco: Never    Family History No family history on file.  Review of Systems 10 systems were reviewed and are negative except as noted specifically in the HPI.  Objective   Vital signs in last 24 hours: BP (!) 94/57 (BP Location: Right Arm)    Pulse 100    Temp (!) 97.4 F (36.3 C) (Oral)    Resp 20    Ht 4\' 10"  (1.473 m)  Wt 44.2 kg    SpO2 96%    BMI 20.37 kg/m   Physical Exam General: NAD, A&O, resting, appropriate HEENT: [place]/AT, EOMI, MMM Pulmonary: Normal work of breathing Cardiovascular: HDS, adequate peripheral perfusion Abdomen: Soft, nondistended.  Some left hand abdominal tenderness GU: voiding Spontaneously Extremities: warm and well perfused Neuro: Appropriate, no focal neurological deficits  Most Recent Labs: Lab Results  Component Value Date   WBC 18.4 (H) 11/29/2021   HGB 10.8 (L) 11/29/2021   HCT 34.8 (L) 11/29/2021   PLT 458 (H) 11/29/2021    Lab Results  Component Value Date   NA 140 11/29/2021   K 3.9 11/29/2021   CL 111 11/29/2021   CO2 24 11/29/2021   BUN 26 (H) 11/29/2021   CREATININE 0.98 11/29/2021   CALCIUM 8.7 (L) 11/29/2021   MG 2.0 08/18/2020    Lab Results  Component Value  Date   INR 1.2 04/01/2008   APTT (H) 04/01/2008    38        IF BASELINE aPTT IS ELEVATED, SUGGEST PATIENT RISK ASSESSMENT BE USED TO DETERMINE APPROPRIATE ANTICOAGULANT THERAPY.     Urine Culture: @[id number] (laburin,org,[id number],[id number])@   IMAGING: CT RENAL STONE STUDY  Result Date: 11/29/2021 CLINICAL DATA:  Patient brought in by [facility] for left sided back pain/flank pain that started last night. EMS states that the family said patient has a hx of kidney stones. Patient given 30mg  Toradol with some relief. EXAM: CT ABDOMEN AND PELVIS WITHOUT CONTRAST TECHNIQUE: Multidetector CT imaging of the abdomen and pelvis was performed following the standard protocol without IV contrast. RADIATION DOSE REDUCTION: This exam was performed according to the departmental dose-optimization program which includes automated exposure control, adjustment of the mA and/or kV according to patient size and/or use of iterative reconstruction technique. COMPARISON:  03/29/2008. FINDINGS: Lower chest: Small to moderate right and small left pleural effusions associated with dependent lower lobe opacity consistent with atelectasis. Hepatobiliary: No focal liver abnormality is seen. No gallstones, gallbladder wall thickening, or biliary dilatation. Pancreas: Unremarkable. No pancreatic ductal dilatation or surrounding inflammatory changes. Spleen: Normal in size without focal abnormality. Adrenals/Urinary Tract: No adrenal masses. Large stone, 2 cm in size, extends from the inferior left renal pelvis to the ureteropelvic junction. There is associated moderate left hydronephrosis and left perinephric stranding. Small nonobstructing stones in the posterior lower pole of the left kidney. No right intrarenal stones or hydronephrosis. Low-density mass arises from the upper pole the right kidney measuring 2.8 cm. There is a 1.4 cm low-density mass arising from the upper pole of the left kidney and a 3 cm low-density mass arising from  the lower pole of the left kidney. These are consistent with cysts. Left ureter below the ureteropelvic stone is normal in course and in caliber. No additional stones. Normal right ureter. Bladder is unremarkable. Stomach/Bowel: Moderate hiatal hernia, incompletely imaged. Stomach otherwise unremarkable. Small-bowel is unremarkable. Colon is mostly decompressed. No bowel wall thickening or inflammatory changes. No evidence of appendicitis. Vascular/Lymphatic: Dense aortic and branch vessel atherosclerotic calcifications. No aneurysm. No enlarged lymph nodes. Reproductive: Uterus and bilateral adnexa are unremarkable. Other: No hernia or ascites. Musculoskeletal: Loss vertebral body height throughout the visualized spine consistent with chronic fractures, most significantly of T12 and L1. L1 fracture is increased from the prior CT and the remaining fractures are new, but none appear acute. No bone lesions. IMPRESSION: 1. 2 cm stone at the left ureteropelvic junction causes moderate left hydronephrosis. 2. No other acute abnormality within the abdomen  or pelvis. 3. Small nonobstructing stones in the lower pole of the left kidney. 4. Multiple vertebral fractures from T11-L5, all presumed old, however most new since the prior CT. 5. Extensive aortic and branch vessel atherosclerotic calcifications. Electronically Signed   By: Amie Portland M.D.   On: 11/29/2021 13:33    ------  Assessment:  88 y.o. female with past ministry of A. fib, legally blind, hypertension, GERD who presented with left flank pain and CT that demonstrates 2 cm  obstructing stone at the left UPJ associated hydronephrosis.   Concerning for infection given: Leukocytes, nitrites, bacteria on urinalysis In the ED received: Ceftriaxone.  We discussed the indications for acute intervention including infected obstruction, bilateral ureteral obstruction or unilateral obstruction of solitary kidney as well as other less urgent indications for  decompression which would included intractable pain, N/V, and acute renal injury. We also discussed the possible inability to place a ureteral stent in which case, the next intervention recommended would be a percutaneous nephrostomy tube. Given obstruction of the left kidney and concern for infection on urinalysis is present, plan for urgent left ureteral stent placement.   Recommendations: Plan for urgent leftureteral stent placement Case posted; Urology will discuss with anesthesia Keep patient NPO Consent obtained from patient and informed consent order placed.  Send urine culture at this time, prior to OR Continue Ceftriaxone for antibiotics.  Recommend 14 days of culture specific antibiotics.  We will arrange for outpatient definitive stone management and preoperative appointment.  Likely ureteroscopic stone extraction staged as her frailty would make a percutaneous nephrostolithotomy risk increased  Thank you for this consult. Please contact the urology consult pager with any further questions/concerns.

## 2021-11-30 NOTE — Assessment & Plan Note (Addendum)
Urine cultures prior to admission as well as here positive for E. coli, pansensitive.  On IV Rocephin.  Procalcitonin trending downward.  White count coming down.

## 2021-11-30 NOTE — Assessment & Plan Note (Signed)
Stress margination versus infection.  Patient on IV Rocephin.  Recheck labs in the morning.

## 2021-11-30 NOTE — Progress Notes (Signed)
Pt's BP on the unit  @0151  was 89/63 (MAP 72) and 0.9 NS is running at 78ml/hr. Will monitor on pt's BP.

## 2021-11-30 NOTE — Anesthesia Procedure Notes (Signed)
Procedure Name: LMA Insertion Date/Time: 11/30/2021 2:02 PM Performed by: Gerald Leitz, CRNA Pre-anesthesia Checklist: Patient identified, Patient being monitored, Timeout performed, Emergency Drugs available and Suction available Patient Re-evaluated:Patient Re-evaluated prior to induction Oxygen Delivery Method: Circle system utilized Preoxygenation: Pre-oxygenation with 100% oxygen Induction Type: IV induction Ventilation: Mask ventilation without difficulty LMA: LMA inserted LMA Size: 3.0 Tube type: Oral Number of attempts: 1 Placement Confirmation: positive ETCO2 and breath sounds checked- equal and bilateral Tube secured with: Tape Dental Injury: Teeth and Oropharynx as per pre-operative assessment

## 2021-11-30 NOTE — Progress Notes (Signed)
[facility] Progress Note  Patient: [name]    [id number]  DOA: 11/29/2021    Date of Service: the patient was seen and examined on 11/30/2021  Brief hospital course: 88 year old female with past medical history of hypertension, atrial fibrillation and glaucoma who is legally blind presented to the emergency room on 1/28 for acute left flank pain and patient found to have 2 cm obstructing stone in the left ureteropelvic junction causing moderate hydronephrosis.  Also found to have a mild UTI and white blood cell count of 18.4.  Patient admitted to hospitalist service with urology planning to take patient today for cystoscopy and left ureteral stent.  Assessment and Plan: * Hydronephrosis of left kidney- (present on admission) For cystoscopy with stent placement today by urology.  Acute lower UTI- (present on admission) Nitrite positive with some bacteria and few leukocytes.  Awaiting urine culture.  On IV Rocephin.  Atrial fibrillation, chronic ([place])- (present on admission) Rate controlled.  Not on anticoagulation.  Leukocytosis- (present on admission) Stress margination versus infection.  Patient on IV Rocephin.  Recheck labs in the morning.  Decubitus ulcer of coccyx, stage 2 ([place])- (present on admission) Wound care.  Decubitus ulcer of lower back, stage 1- (present on admission) Wound care.  GERD (gastroesophageal reflux disease)- (present on admission) Continue PPI.    Body mass index is 20.37 kg/m.    Pressure Injury 11/30/21 Coccyx Medial Stage 2 -  Partial thickness loss of dermis presenting as a shallow open injury with a red, pink wound bed without slough. (Active)  11/30/21 0208  Location: Coccyx  Location Orientation: Medial  Staging: Stage 2 -  Partial thickness loss of dermis presenting as a shallow open injury with a red, pink wound bed without slough.  Wound Description (Comments):   Present on Admission: Yes     Pressure Injury 11/30/21  Vertebral column Mid Stage 1 -  Intact skin with non-blanchable redness of a localized area usually over a bony prominence. redness d/t spine curvature (Active)  11/30/21 0150  Location: Vertebral column  Location Orientation: Mid  Staging: Stage 1 -  Intact skin with non-blanchable redness of a localized area usually over a bony prominence.  Wound Description (Comments): redness d/t spine curvature  Present on Admission: Yes     Consultants: Urology  Procedures: Cystoscopy with stent placement  Antimicrobials: IV Rocephin 1/28-present  Code Status: DNR   Subjective: Fatigued  Objective: Some borderline hypotension Vitals:   11/30/21 1515 11/30/21 1545  BP: 104/60 (!) 97/59  Pulse: 90 92  Resp: (!) 21 20  Temp:  98.5 F (36.9 C)  SpO2: 91% 93%    Intake/Output Summary (Last 24 hours) at 11/30/2021 1631 Last data filed at 11/30/2021 1510 Gross per 24 hour  Intake 1699.47 ml  Output --  Net 1699.47 ml   Filed Weights   11/30/21 0606  Weight: 44.2 kg   Body mass index is 20.37 kg/m.  Exam:  General: Alert and oriented x2, no acute distress HEENT: Normocephalic and atraumatic, mucous membranes slightly dry Cardiovascular: Irregular rhythm, rate controlled Respiratory: Clear to auscultation bilaterally Abdomen: Soft, nontender, nondistended, positive bowel sounds Musculoskeletal: No clubbing or cyanosis or edema Skin: Decubitus wounds on lower back and coccyx Psychiatry: Appropriate, no evidence of psychoses Neurology: No focal deficits  Data Reviewed: White blood cell count elevated at 18.4  Disposition:  Status is: Inpatient  Remains inpatient appropriate because: Improvement in infection   Family Communication: Daughter at the bedside DVT Prophylaxis: SCDs  Start: 11/29/21 2012    Author: Annita Brod ,MD 11/30/2021 4:31 PM  To reach On-call, see care teams to locate the attending and reach out via [email or web address]. Between 7PM-7AM, please  contact night-coverage If you still have difficulty reaching the attending provider, please page the [place] (Director on Call) for [facility] on amion for assistance.

## 2021-11-30 NOTE — Transfer of Care (Signed)
Immediate Anesthesia Transfer of Care Note  Patient: [name]  Procedure(s) Performed: Procedure(s): CYSTOSCOPY WITH RETROGRADE PYELOGRAM/URETERAL STENT PLACEMENT (Left)  Patient Location: PACU  Anesthesia Type:General  Level of Consciousness: Alert, Awake, Oriented  Airway & Oxygen Therapy: Patient Spontanous Breathing  Post-op Assessment: Report given to RN  Post vital signs: Reviewed and stable  Last Vitals:  Vitals:   11/30/21 1436 11/30/21 1440  BP: (!) 83/47 108/63  Pulse:  66  Resp: 19 18  Temp: 36.8 C   SpO2: 0000000 [id number]    Complications: No apparent anesthesia complications

## 2021-11-30 NOTE — Assessment & Plan Note (Addendum)
Status post cystoscopy with stent placement by urology

## 2021-11-30 NOTE — Hospital Course (Addendum)
88 year old female with past medical history of hypertension, atrial fibrillation and glaucoma who is legally blind presented to the emergency room on 1/28 for acute left flank pain and patient found to have 2 cm obstructing stone in the left ureteropelvic junction causing moderate hydronephrosis.  Also found to have a E.Coli UTI and white blood cell count of 22.  Patient admitted to hospitalist service and urology took patient on 1/30 for cystoscopy and left ureteral stent.  Today, patient noted to be hypoxic.  BNP came back elevated at 900.  Volume overload secondary to aggressive fluid resuscitation.  Fluids stopped and patient started on IV Lasix, diuresing 3.4 L to date.  Patient able to be weaned off of oxygen.

## 2021-12-01 ENCOUNTER — Encounter (HOSPITAL_COMMUNITY): Payer: Self-pay | Admitting: Urology

## 2021-12-01 DIAGNOSIS — I5031 Acute diastolic (congestive) heart failure: Secondary | ICD-10-CM | POA: Diagnosis not present

## 2021-12-01 DIAGNOSIS — J9601 Acute respiratory failure with hypoxia: Secondary | ICD-10-CM | POA: Diagnosis not present

## 2021-12-01 DIAGNOSIS — I482 Chronic atrial fibrillation, unspecified: Secondary | ICD-10-CM | POA: Diagnosis not present

## 2021-12-01 DIAGNOSIS — N390 Urinary tract infection, site not specified: Secondary | ICD-10-CM | POA: Diagnosis not present

## 2021-12-01 LAB — CBC
HCT: 31 % — ABNORMAL LOW (ref 36.0–46.0)
Hemoglobin: 9.7 g/dL — ABNORMAL LOW (ref 12.0–15.0)
MCH: 24.5 pg — ABNORMAL LOW (ref 26.0–34.0)
MCHC: 31.3 g/dL (ref 30.0–36.0)
MCV: 78.3 fL — ABNORMAL LOW (ref 80.0–100.0)
Platelets: 267 10*3/uL (ref 150–400)
RBC: 3.96 MIL/uL (ref 3.87–5.11)
RDW: 21.2 % — ABNORMAL HIGH (ref 11.5–15.5)
WBC: 22.3 10*3/uL — ABNORMAL HIGH (ref 4.0–10.5)
nRBC: 0 % (ref 0.0–0.2)

## 2021-12-01 LAB — BASIC METABOLIC PANEL
Anion gap: 6 (ref 5–15)
BUN: 34 mg/dL — ABNORMAL HIGH (ref 8–23)
CO2: 18 mmol/L — ABNORMAL LOW (ref 22–32)
Calcium: 7.3 mg/dL — ABNORMAL LOW (ref 8.9–10.3)
Chloride: 115 mmol/L — ABNORMAL HIGH (ref 98–111)
Creatinine, Ser: 0.99 mg/dL (ref 0.44–1.00)
GFR, Estimated: 53 mL/min — ABNORMAL LOW (ref >60)
Glucose, Bld: 148 mg/dL — ABNORMAL HIGH (ref 70–99)
Potassium: 4.2 mmol/L (ref 3.5–5.1)
Sodium: 139 mmol/L (ref 135–145)

## 2021-12-01 LAB — PROCALCITONIN: Procalcitonin: 7.25 ng/mL

## 2021-12-01 LAB — BRAIN NATRIURETIC PEPTIDE: B Natriuretic Peptide: 906.5 pg/mL — ABNORMAL HIGH (ref 0.0–100.0)

## 2021-12-01 MED ORDER — BOOST / RESOURCE BREEZE PO LIQD CUSTOM
1.0000 | Freq: Two times a day (BID) | ORAL | Status: DC
  Administered 2021-12-01 – 2021-12-02 (×2): 1 via ORAL

## 2021-12-01 MED ORDER — FUROSEMIDE 10 MG/ML IJ SOLN
20.0000 mg | Freq: Two times a day (BID) | INTRAMUSCULAR | Status: DC
  Administered 2021-12-01 – 2021-12-03 (×4): 20 mg via INTRAVENOUS
  Filled 2021-12-01 (×4): qty 2

## 2021-12-01 MED ORDER — ENSURE ENLIVE PO LIQD
237.0000 mL | ORAL | Status: DC
  Administered 2021-12-02 – 2021-12-03 (×2): 237 mL via ORAL

## 2021-12-01 NOTE — Assessment & Plan Note (Addendum)
Secondary to CHF.  Able to be weaned off of oxygen.

## 2021-12-01 NOTE — TOC Initial Note (Signed)
Transition of Care [facility]) - Initial/Assessment Note    Patient Details  Name: [name] MRN: [id number] Date of Birth: Jan 28, 1926  Transition of Care [facility]) CM/SW Contact:    Leeroy Cha, RN Phone Number: 12/01/2021, 9:20 AM  Clinical Narrative:                  Transition of Care [facility]) Screening Note   Patient Details  Name: [name] Date of Birth: 1925/12/26   Transition of Care [facility]) CM/SW Contact:    Leeroy Cha, RN Phone Number: 12/01/2021, 9:20 AM    Transition of Care Department [facility]) has reviewed patient and no TOC needs have been identified at this time. We will continue to monitor patient advancement through interdisciplinary progression rounds. If new patient transition needs arise, please place a TOC consult.    Expected Discharge Plan: Home/Self Care Barriers to Discharge: Continued Medical Work up   Patient Goals and CMS Choice Patient states their goals for this hospitalization and ongoing recovery are:: to go home CMS Medicare.gov Compare Post Acute Care list provided to:: Patient Choice offered to / list presented to : Patient  Expected Discharge Plan and Services Expected Discharge Plan: Home/Self Care   Discharge Planning Services: CM Consult   Living arrangements for the past 2 months: Single Family Home                                      Prior Living Arrangements/Services Living arrangements for the past 2 months: [place] Lives with:: Self Patient language and need for interpreter reviewed:: Yes Do you feel safe going back to the place where you live?: Yes            Criminal Activity/Legal Involvement Pertinent to Current Situation/Hospitalization: No - Comment as needed  Activities of Daily Living Home Assistive Devices/Equipment: Environmental consultant (specify type), Wheelchair ADL Screening (condition at time of admission) Patient's cognitive ability adequate to safely complete daily activities?:  Yes Is the patient deaf or have difficulty hearing?: Yes Does the patient have difficulty seeing, even when wearing glasses/contacts?: Yes Does the patient have difficulty concentrating, remembering, or making decisions?: No Patient able to express need for assistance with ADLs?: Yes Does the patient have difficulty dressing or bathing?: No Independently performs ADLs?: Yes (appropriate for developmental age) Does the patient have difficulty walking or climbing stairs?: Yes Weakness of Legs: Both Weakness of Arms/Hands: None  Permission Sought/Granted                  Emotional Assessment Appearance:: Appears stated age     Orientation: : Oriented to Self, Oriented to Place, Oriented to  Time, Oriented to Situation Alcohol / Substance Use: Not Applicable Psych Involvement: No (comment)  Admission diagnosis:  Kidney stone [N20.0] Left nephrolithiasis [N20.0] Hydronephrosis, unspecified hydronephrosis type [N13.30] Patient Active Problem List   Diagnosis Date Noted   Decubitus ulcer of coccyx, stage 2 ([place]) 11/30/2021   Decubitus ulcer of lower back, stage 1 11/30/2021   Hydronephrosis of left kidney 11/30/2021   Left nephrolithiasis 11/29/2021   Leukocytosis 11/29/2021   Normocytic anemia 11/29/2021   Essential hypertension 11/29/2021   GERD (gastroesophageal reflux disease) 11/29/2021   Glaucoma 11/29/2021   Dehydration 11/29/2021   Atrial fibrillation, chronic ([place]) 11/29/2021   Acute lower UTI 08/19/2020   Pressure injury of skin 08/16/2020   Cough    PCP:  Sharilyn Sites, MD Pharmacy:   [facility] [address] [place] [address] Phone: [phone] Fax: [phone]     Social Determinants of Health (SDOH) Interventions    Readmission Risk Interventions Readmission Risk Prevention Plan 08/19/2020  Post Dischage Appt Complete  Medication Screening Complete  Transportation Screening Complete  Some recent  data might be hidden

## 2021-12-01 NOTE — Progress Notes (Signed)
[facility] Progress Note  Patient: [name]    [id number]  DOA: 11/29/2021    Date of Service: the patient was seen and examined on 12/01/2021  Brief hospital course: 88 year old female with past medical history of hypertension, atrial fibrillation and glaucoma who is legally blind presented to the emergency room on 1/28 for acute left flank pain and patient found to have 2 cm obstructing stone in the left ureteropelvic junction causing moderate hydronephrosis.  Also found to have a mild UTI and white blood cell count of 18.4.  Patient admitted to hospitalist service and urology took patient on 1/30 for cystoscopy and left ureteral stent.  Today, patient noted to be hypoxic.  BNP came back elevated at 900.  Volume overload secondary to aggressive fluid resuscitation.  Fluids stopped and patient started on IV Lasix.  Assessment and Plan: * Acute diastolic CHF (congestive heart failure) ([place]) Patient aggressively fluid resuscitated secondary to renal obstruction.  Noted to be mildly hypoxic.  BNP came back elevated at greater than 900.  Have stopped fluids and started IV Lasix.  Acute respiratory failure with hypoxia (HCC) Secondary to CHF  Hydronephrosis of left kidney- (present on admission) Status post cystoscopy with stent placement by urology  Acute lower UTI- (present on admission) Nitrite positive with some bacteria and few leukocytes.  Awaiting urine culture.  On IV Rocephin.  Atrial fibrillation, chronic ([place])- (present on admission) Rate controlled.  Not on anticoagulation.  Leukocytosis- (present on admission) Stress margination versus infection.  Patient on IV Rocephin.  Recheck labs in the morning.  Decubitus ulcer of coccyx, stage 2 ([place])- (present on admission) Wound care.  Decubitus ulcer of lower back, stage 1- (present on admission) Wound care.  GERD (gastroesophageal reflux disease)- (present on admission) Continue PPI.    Body mass index is  20.37 kg/m.  Nutrition Problem: Inadequate oral intake Etiology: decreased appetite Pressure Injury 11/30/21 Coccyx Medial Stage 2 -  Partial thickness loss of dermis presenting as a shallow open injury with a red, pink wound bed without slough. (Active)  11/30/21 0208  Location: Coccyx  Location Orientation: Medial  Staging: Stage 2 -  Partial thickness loss of dermis presenting as a shallow open injury with a red, pink wound bed without slough.  Wound Description (Comments):   Present on Admission: Yes     Pressure Injury 11/30/21 Vertebral column Mid Stage 1 -  Intact skin with non-blanchable redness of a localized area usually over a bony prominence. redness d/t spine curvature (Active)  11/30/21 0150  Location: Vertebral column  Location Orientation: Mid  Staging: Stage 1 -  Intact skin with non-blanchable redness of a localized area usually over a bony prominence.  Wound Description (Comments): redness d/t spine curvature  Present on Admission: Yes     Consultants: Urology  Procedures: Cystoscopy with stent placement  Antimicrobials: IV Rocephin 1/28-present  Code Status: DNR   Subjective: Complains of some shortness of breath  Objective: Vitals reviewed, noting some hypoxia on 2 L nasal cannula Vitals:   12/01/21 1005 12/01/21 1330  BP: 136/82 117/68  Pulse:  85  Resp:  18  Temp:  97.9 F (36.6 C)  SpO2:  94%    Intake/Output Summary (Last 24 hours) at 12/01/2021 1531 Last data filed at 12/01/2021 1331 Gross per 24 hour  Intake 480 ml  Output 150 ml  Net 330 ml    Filed Weights   11/30/21 0606  Weight: 44.2 kg   Body mass index is 20.37  kg/m.  Exam:  General: Alert and oriented x2, fatigued HEENT: Normocephalic and atraumatic, mucous membranes slightly dry Cardiovascular: Irregular rhythm, rate controlled Respiratory: Clear to auscultation bilaterally Abdomen: Soft, nontender, nondistended, positive bowel sounds Musculoskeletal: No clubbing or  cyanosis or edema Skin: Decubitus wounds on lower back and coccyx Psychiatry: Appropriate, no evidence of psychoses Neurology: No focal deficits  Data Reviewed: Labs reviewed.  Noted procalcitonin has improved from 17.6 down to 7.25.  Elevated BNP at 907.  White blood cell count with mild improvement from 24 down to 22.  Creatinine at 0.99, down from 1.25.  Urine cultures prior to admission positive for E. coli with sensitivities pending.  Disposition:  Status is: Inpatient  Remains inpatient appropriate because: Improvement in infection   Family Communication: Daughter and granddaughter at the bedside DVT Prophylaxis: SCDs Start: 11/29/21 2012    Author: Annita Brod ,MD 12/01/2021 3:31 PM  To reach On-call, see care teams to locate the attending and reach out via [email or web address]. Between 7PM-7AM, please contact night-coverage If you still have difficulty reaching the attending provider, please page the [facility] (Director on Call) for [facility] on amion for assistance.

## 2021-12-01 NOTE — Progress Notes (Signed)
Urology Progress Note   1 Day Post-Op from left ureteral stent placement.   Subjective: NAEON. Bps improved. Voiding. Flank pain much improved. GNR's in urine from intra-op. Culture pending.   Objective: Vital signs in last 24 hours: Temp:  [98 F (36.7 C)-98.5 F (36.9 C)] 98 F (36.7 C) (01/30 6222) Pulse Rate:  [66-103] 66 (01/30 0608) Resp:  [18-24] 20 (01/30 9798) BP: (83-143)/(47-72) 143/72 (01/30 0608) SpO2:  [91 %-99 %] 93 % (01/30 9211)  Intake/Output from previous day: 01/29 0701 - 01/30 0700 In: 1120 [P.O.:120; I.V.:1000] Out: 150 [Urine:150] Intake/Output this shift: No intake/output data recorded.  Physical Exam:  General: Alert and oriented. Nasal cannula in place.  CV: Regular rate Lungs: No increased work of breathing Abdomen:  Soft, appropriately tender.  GU: voiding Ext: NT, No erythema  Lab Results: Recent Labs    11/29/21 1322  HGB 10.8*  HCT 34.8*   Recent Labs    11/29/21 1322  NA 140  K 3.9  CL 111  CO2 24  GLUCOSE 104*  BUN 26*  CREATININE 0.98  CALCIUM 8.7*    Studies/Results: DG C-Arm 1-60 Min-No Report  Result Date: 11/30/2021 Fluoroscopy was utilized by the requesting physician.  No radiographic interpretation.   CT RENAL STONE STUDY  Result Date: 11/29/2021 CLINICAL DATA:  Patient brought in by [facility] for left sided back pain/flank pain that started last night. EMS states that the family said patient has a hx of kidney stones. Patient given 30mg  Toradol with some relief. EXAM: CT ABDOMEN AND PELVIS WITHOUT CONTRAST TECHNIQUE: Multidetector CT imaging of the abdomen and pelvis was performed following the standard protocol without IV contrast. RADIATION DOSE REDUCTION: This exam was performed according to the departmental dose-optimization program which includes automated exposure control, adjustment of the mA and/or kV according to patient size and/or use of iterative reconstruction technique. COMPARISON:  03/29/2008. FINDINGS:  Lower chest: Small to moderate right and small left pleural effusions associated with dependent lower lobe opacity consistent with atelectasis. Hepatobiliary: No focal liver abnormality is seen. No gallstones, gallbladder wall thickening, or biliary dilatation. Pancreas: Unremarkable. No pancreatic ductal dilatation or surrounding inflammatory changes. Spleen: Normal in size without focal abnormality. Adrenals/Urinary Tract: No adrenal masses. Large stone, 2 cm in size, extends from the inferior left renal pelvis to the ureteropelvic junction. There is associated moderate left hydronephrosis and left perinephric stranding. Small nonobstructing stones in the posterior lower pole of the left kidney. No right intrarenal stones or hydronephrosis. Low-density mass arises from the upper pole the right kidney measuring 2.8 cm. There is a 1.4 cm low-density mass arising from the upper pole of the left kidney and a 3 cm low-density mass arising from the lower pole of the left kidney. These are consistent with cysts. Left ureter below the ureteropelvic stone is normal in course and in caliber. No additional stones. Normal right ureter. Bladder is unremarkable. Stomach/Bowel: Moderate hiatal hernia, incompletely imaged. Stomach otherwise unremarkable. Small-bowel is unremarkable. Colon is mostly decompressed. No bowel wall thickening or inflammatory changes. No evidence of appendicitis. Vascular/Lymphatic: Dense aortic and branch vessel atherosclerotic calcifications. No aneurysm. No enlarged lymph nodes. Reproductive: Uterus and bilateral adnexa are unremarkable. Other: No hernia or ascites. Musculoskeletal: Loss vertebral body height throughout the visualized spine consistent with chronic fractures, most significantly of T12 and L1. L1 fracture is increased from the prior CT and the remaining fractures are new, but none appear acute. No bone lesions. IMPRESSION: 1. 2 cm stone at the left ureteropelvic  junction causes  moderate left hydronephrosis. 2. No other acute abnormality within the abdomen or pelvis. 3. Small nonobstructing stones in the lower pole of the left kidney. 4. Multiple vertebral fractures from T11-L5, all presumed old, however most new since the prior CT. 5. Extensive aortic and branch vessel atherosclerotic calcifications. Electronically Signed   By: Amie Portland M.D.   On: 11/29/2021 13:33    Assessment/Plan:   88 y.o. female with past [employer] of [employer] fib, legally blind, hypertension, GERD who presented with left flank pain and CT that demonstrates 2 cm  obstructing stone at the left UPJ associated hydronephrosis and UA suggestive of UTI.  Now s/p Left ureteral stent placement on 11/30/21 with Dr. Laverle Patter.  Overall doing well post-op.  - Continue antibiotics tailored per urine culture speciation and sensitivities.  Recommend 14 days of culture specific antibiotics.  We will arrange for outpatient definitive stone management and preoperative appointment.  Likely ureteroscopic stone extraction staged as her frailty would make a percutaneous nephrostolithotomy risk increased  Dispo: per primary team   LOS: 2 days

## 2021-12-01 NOTE — Progress Notes (Signed)
Initial Nutrition Assessment  DOCUMENTATION CODES:  Not applicable  INTERVENTION:  Continue current diet as ordered Change Ensure Enlive to 1x/d, each supplement provides 350 kcal and 20 grams of protein, prefers chocolate Boost Breeze po BID, each supplement provides 250 kcal and 9 grams of protein  NUTRITION DIAGNOSIS:  Inadequate oral intake related to decreased appetite as evidenced by per patient/family report.  GOAL:  Patient will meet greater than or equal to 90% of their needs  MONITOR:  PO intake, Supplement acceptance, Weight trends  REASON FOR ASSESSMENT:  Malnutrition Screening Tool, Consult Assessment of nutrition requirement/status  ASSESSMENT:  88 y.o. female with history of HTN, atrial fibrillation, GERD, and glaucoma (legally blind) presented to ED with worsening left flank pain. Imaging in ED showed left nephrolithiasis and workup suggestive of UTI.  Noted that at baseline, pt lives with her daughter and ambulates with a [employer]czyk.  1/29 - cystoscopy with left stent placement  Called pt on room phone to discuss recent nutrition hx. Pt is hard of hearing, so spoke with daughter. Reports that pt's appetite has not been very good, but this is her typical eating pattern at home. States that pt eats 1 meal and 1 snack a day. Also drinks very little fluids (1 coke and ~2 cups of water a day). Pt does not drink ensure at home, but did sip on the one that was brought to her today. Daughter thinks boost breeze may be better accepted. Reports that pt is selective with her foods.   Daughter reports stable weight at 90-95 lbs.   Encouraged adequate nutrition and hydration to promote recovery from acute illness.    Average Meal Intake: 1/28-1/30: 56% average intake x 4 recorded meals (0-95%, trending up)  Nutritionally Relevant Medications: Scheduled Meds:  Ensure Enlive  237 mL Oral BID BM   furosemide  20 mg Intravenous BID   pantoprazole  40 mg Oral Daily    Continuous Infusions:  cefTRIAXone (ROCEPHIN)  IV 1 g (11/30/21 1657)   Labs Reviewed: BUN 34 Mg 1.3 (1/29)   NUTRITION - FOCUSED PHYSICAL EXAM: Defer to in-person assessment  Diet Order:   Diet Order             Diet regular Room service appropriate? Yes; Fluid consistency: Thin  Diet effective now                   EDUCATION NEEDS:  No education needs have been identified at this time  Skin:  Skin Assessment: Reviewed RN Assessment (Ecchymosis to the bilateral arms, stage 2 to the coccyx, stage 1 to the vertebral column)  Last BM:  1/28  Height:  Ht Readings from Last 1 Encounters:  11/30/21 4\' 10"  (1.473 m)   Weight:  Wt Readings from Last 1 Encounters:  11/30/21 44.2 kg    Ideal Body Weight:  43.2 kg  BMI:  Body mass index is 20.37 kg/m.  Estimated Nutritional Needs:  Kcal:  1200-1400 kcal/d Protein:  60-75 g/d Fluid:  >1.5 L/d  Ranell Patrick, RD, LDN Clinical Dietitian RD pager # available in AM[employer]  After hours/weekend pager # available in [facility]

## 2021-12-01 NOTE — Assessment & Plan Note (Addendum)
Patient aggressively fluid resuscitated secondary to renal obstruction.  Noted to be mildly hypoxic.  BNP came back elevated at greater than 900.  Have stopped fluids and started IV Lasix.  Patient has diuresed over 3 L of fluid.  We will continue Lasix, following labs in the morning.

## 2021-12-02 DIAGNOSIS — N390 Urinary tract infection, site not specified: Secondary | ICD-10-CM | POA: Diagnosis not present

## 2021-12-02 DIAGNOSIS — I482 Chronic atrial fibrillation, unspecified: Secondary | ICD-10-CM | POA: Diagnosis not present

## 2021-12-02 DIAGNOSIS — I5031 Acute diastolic (congestive) heart failure: Secondary | ICD-10-CM | POA: Diagnosis not present

## 2021-12-02 DIAGNOSIS — J9601 Acute respiratory failure with hypoxia: Secondary | ICD-10-CM | POA: Diagnosis not present

## 2021-12-02 LAB — CBC
HCT: 30.7 % — ABNORMAL LOW (ref 36.0–46.0)
Hemoglobin: 9.8 g/dL — ABNORMAL LOW (ref 12.0–15.0)
MCH: 24.3 pg — ABNORMAL LOW (ref 26.0–34.0)
MCHC: 31.9 g/dL (ref 30.0–36.0)
MCV: 76.2 fL — ABNORMAL LOW (ref 80.0–100.0)
Platelets: 265 10*3/uL (ref 150–400)
RBC: 4.03 MIL/uL (ref 3.87–5.11)
RDW: 20.9 % — ABNORMAL HIGH (ref 11.5–15.5)
WBC: 18.5 10*3/uL — ABNORMAL HIGH (ref 4.0–10.5)
nRBC: 0 % (ref 0.0–0.2)

## 2021-12-02 LAB — BASIC METABOLIC PANEL
Anion gap: 10 (ref 5–15)
BUN: 34 mg/dL — ABNORMAL HIGH (ref 8–23)
CO2: 19 mmol/L — ABNORMAL LOW (ref 22–32)
Calcium: 8 mg/dL — ABNORMAL LOW (ref 8.9–10.3)
Chloride: 110 mmol/L (ref 98–111)
Creatinine, Ser: 0.9 mg/dL (ref 0.44–1.00)
GFR, Estimated: 60 mL/min — ABNORMAL LOW (ref >60)
Glucose, Bld: 92 mg/dL (ref 70–99)
Potassium: 3.6 mmol/L (ref 3.5–5.1)
Sodium: 139 mmol/L (ref 135–145)

## 2021-12-02 LAB — URINE CULTURE: Culture: >=100000 — AB

## 2021-12-02 LAB — PROCALCITONIN: Procalcitonin: 5.63 ng/mL

## 2021-12-02 NOTE — Progress Notes (Signed)
Patient ID: [name], female   DOB: 10/26/1926, 88 y.o.   MRN: [id number]  2 Days Post-Op Subjective: Pt continues to deny pain.  No fever.  Objective: Vital signs in last 24 hours: Temp:  [97.7 F (36.5 C)-97.9 F (36.6 C)] 97.7 F (36.5 C) (01/31 0548) Pulse Rate:  [78-88] 78 (01/31 0548) Resp:  [18] 18 (01/31 0548) BP: (117-141)/(68-82) 129/69 (01/31 0548) SpO2:  [94 %-97 %] 95 % (01/31 0548)  Intake/Output from previous day: 01/30 0701 - 01/31 0700 In: 1320 [P.O.:1320] Out: 2200 [Urine:2200] Intake/Output this shift: No intake/output data recorded.  Physical Exam:  General: Alert and oriented Abdomen: Soft, NT, No CVAT  Lab Results: Recent Labs    11/30/21 0446 12/01/21 1106 12/02/21 0424  HGB 9.3* 9.7* 9.8*  HCT 30.5* 31.0* 30.7*   CBC Latest Ref Rng & Units 12/02/2021 12/01/2021 11/30/2021  WBC 4.0 - 10.5 K/uL 18.5(H) 22.3(H) 24.3(H)  Hemoglobin 12.0 - 15.0 g/dL 9.8(L) 9.7(L) 9.3(L)  Hematocrit 36.0 - 46.0 % 30.7(L) 31.0(L) 30.5(L)  Platelets 150 - 400 K/uL 265 267 292     BMET Recent Labs    12/01/21 1106 12/02/21 0424  NA 139 139  K 4.2 3.6  CL 115* 110  CO2 18* 19*  GLUCOSE 148* 92  BUN 34* 34*  CREATININE 0.99 0.90  CALCIUM 7.3* 8.0*     Studies/Results: Urine culture: pansensitive E coli  Assessment/Plan: 1) Left UPJ calculus with UTI: S/P left ureteral stent 11/30/21.  She will need to complete 10-14 days of culture specific antibiotics.  Based on culture results and allergy list and tolerability of current therapy in the hospital, would be best to consider either ciprofloxacin or 3rd generation cephalosporin.  I will arrange outpatient follow up for her and her daughter to discuss definitive treatment options of stone after infection is adequately treated.  Please call if further questions.   LOS: 3 days   Dutch Gray 12/02/2021, 7:56 AM

## 2021-12-02 NOTE — Progress Notes (Signed)
[facility] Progress Note  Patient: [name]    [id number]  DOA: 11/29/2021    Date of Service: the patient was seen and examined on 12/02/2021  Brief hospital course: 88 year old female with past medical history of hypertension, atrial fibrillation and glaucoma who is legally blind presented to the emergency room on 1/28 for acute left flank pain and patient found to have 2 cm obstructing stone in the left ureteropelvic junction causing moderate hydronephrosis.  Also found to have a E.Coli UTI and white blood cell count of 22.  Patient admitted to hospitalist service and urology took patient on 1/30 for cystoscopy and left ureteral stent.  Today, patient noted to be hypoxic.  BNP came back elevated at 900.  Volume overload secondary to aggressive fluid resuscitation.  Fluids stopped and patient started on IV Lasix, diuresing 3.4 L to date.  Patient able to be weaned off of oxygen.  Assessment and Plan: * Acute diastolic CHF (congestive heart failure) ([place]) Patient aggressively fluid resuscitated secondary to renal obstruction.  Noted to be mildly hypoxic.  BNP came back elevated at greater than 900.  Have stopped fluids and started IV Lasix.  Patient has diuresed over 3 L of fluid.  We will continue Lasix, following labs in the morning.  Acute respiratory failure with hypoxia (HCC)-resolved as of 12/02/2021 Secondary to CHF.  Able to be weaned off of oxygen.  Hydronephrosis of left kidney- (present on admission) Status post cystoscopy with stent placement by urology  Acute lower UTI- (present on admission) Urine cultures prior to admission as well as here positive for E. coli, pansensitive.  On IV Rocephin.  Procalcitonin trending downward.  White count coming down.  Atrial fibrillation, chronic ([place])- (present on admission) Rate controlled.  Not on anticoagulation.  Leukocytosis- (present on admission) Stress margination versus infection.  Patient on IV Rocephin.  Recheck  labs in the morning.  Decubitus ulcer of coccyx, stage 2 ([place])- (present on admission) Wound care.  Decubitus ulcer of lower back, stage 1- (present on admission) Wound care.  GERD (gastroesophageal reflux disease)- (present on admission) Continue PPI.    Body mass index is 20.37 kg/m.  Nutrition Problem: Inadequate oral intake Etiology: decreased appetite Pressure Injury 11/30/21 Coccyx Medial Stage 2 -  Partial thickness loss of dermis presenting as a shallow open injury with a red, pink wound bed without slough. (Active)  11/30/21 0208  Location: Coccyx  Location Orientation: Medial  Staging: Stage 2 -  Partial thickness loss of dermis presenting as a shallow open injury with a red, pink wound bed without slough.  Wound Description (Comments):   Present on Admission: Yes     Pressure Injury 11/30/21 Vertebral column Mid Stage 1 -  Intact skin with non-blanchable redness of a localized area usually over a bony prominence. redness d/t spine curvature (Active)  11/30/21 0150  Location: Vertebral column  Location Orientation: Mid  Staging: Stage 1 -  Intact skin with non-blanchable redness of a localized area usually over a bony prominence.  Wound Description (Comments): redness d/t spine curvature  Present on Admission: Yes     Consultants: Urology  Procedures: Cystoscopy with stent placement  Antimicrobials: IV Rocephin 1/28-present  Code Status: DNR   Subjective: Patient doing okay with no complaints.  States that she is feeling better and breathing easier.  Vitals reviewed.  Now on room air.  Objective: Vitals reviewed, noting some hypoxia on 2 L nasal cannula Vitals:   12/02/21 0548 12/02/21 1238  BP: 129/69  118/64  Pulse: 78 77  Resp: 18 16  Temp: 97.7 F (36.5 C)   SpO2: 95% 96%    Intake/Output Summary (Last 24 hours) at 12/02/2021 1709 Last data filed at 12/02/2021 1300 Gross per 24 hour  Intake 1200 ml  Output 3150 ml  Net -1950 ml    Filed  Weights   11/30/21 0606  Weight: 44.2 kg   Body mass index is 20.37 kg/m.  Exam:  General: Alert and oriented x2, no acute distress HEENT: Normocephalic and atraumatic, mucous membranes slightly dry Cardiovascular: Irregular rhythm, rate controlled Respiratory: Clear to auscultation bilaterally Abdomen: Soft, nontender, nondistended, positive bowel sounds Musculoskeletal: No clubbing or cyanosis, trace pitting edema Skin: Decubitus wounds on lower back and coccyx Psychiatry: Appropriate, no evidence of psychoses Neurology: No focal deficits  Data Reviewed: Labs reviewed.  Noted procalcitonin continues to improve, down to 5.6.  White blood cell count down to 18.  Disposition:  Status is: Inpatient  Remains inpatient appropriate because: Improvement in infection and continue to diurese.  Anticipated date of discharge is 2/2.   Family Communication: Updated daughter by phone. DVT Prophylaxis: SCDs Start: 11/29/21 2012    Author: Annita Brod ,MD 12/02/2021 5:09 PM  To reach On-call, see care teams to locate the attending and reach out via [email or web address]. Between 7PM-7AM, please contact night-coverage If you still have difficulty reaching the attending provider, please page the [facility] (Director on Call) for [facility] on amion for assistance.

## 2021-12-03 DIAGNOSIS — I5031 Acute diastolic (congestive) heart failure: Secondary | ICD-10-CM | POA: Diagnosis not present

## 2021-12-03 LAB — BASIC METABOLIC PANEL
Anion gap: 8 (ref 5–15)
BUN: 28 mg/dL — ABNORMAL HIGH (ref 8–23)
CO2: 22 mmol/L (ref 22–32)
Calcium: 8.1 mg/dL — ABNORMAL LOW (ref 8.9–10.3)
Chloride: 109 mmol/L (ref 98–111)
Creatinine, Ser: 0.86 mg/dL (ref 0.44–1.00)
GFR, Estimated: >60 mL/min (ref >60)
Glucose, Bld: 88 mg/dL (ref 70–99)
Potassium: 3.6 mmol/L (ref 3.5–5.1)
Sodium: 139 mmol/L (ref 135–145)

## 2021-12-03 LAB — URINE CULTURE: Culture: >=100000 — AB

## 2021-12-03 LAB — CBC
HCT: 32.7 % — ABNORMAL LOW (ref 36.0–46.0)
Hemoglobin: 10.4 g/dL — ABNORMAL LOW (ref 12.0–15.0)
MCH: 24.2 pg — ABNORMAL LOW (ref 26.0–34.0)
MCHC: 31.8 g/dL (ref 30.0–36.0)
MCV: 76.2 fL — ABNORMAL LOW (ref 80.0–100.0)
Platelets: 267 10*3/uL (ref 150–400)
RBC: 4.29 MIL/uL (ref 3.87–5.11)
RDW: 20.6 % — ABNORMAL HIGH (ref 11.5–15.5)
WBC: 11.6 10*3/uL — ABNORMAL HIGH (ref 4.0–10.5)
nRBC: 0.2 % (ref 0.0–0.2)

## 2021-12-03 LAB — PROCALCITONIN: Procalcitonin: 2.63 ng/mL

## 2021-12-03 MED ORDER — CEPHALEXIN 500 MG PO CAPS: 500.0000 mg | ORAL_CAPSULE | Freq: Three times a day (TID) | ORAL | 0 refills | Status: AC

## 2021-12-03 MED ORDER — SODIUM CHLORIDE 0.9 % IV SOLN
1.0000 g | INTRAVENOUS | Status: DC
  Administered 2021-12-03: 1 g via INTRAVENOUS
  Filled 2021-12-03: qty 10

## 2021-12-03 NOTE — TOC Progression Note (Signed)
Transition of Care [facility]) - Progression Note    Patient Details  Name: [name] MRN: [id number] Date of Birth: 1925/11/27  [facility] CM/SW Contact  Golda Acre, RN Phone Number: 12/03/2021, 8:08 AM  Clinical Narrative:    Urine culture positive for e. Coli, wbc=11.6 , iv abx /following for toc needs.   Expected Discharge Plan: Home/Self Care Barriers to Discharge: Continued Medical Work up  Expected Discharge Plan and Services Expected Discharge Plan: Home/Self Care   Discharge Planning Services: CM Consult   Living arrangements for the past 2 months: Single Family Home                                       Social Determinants of Health (SDOH) Interventions    Readmission Risk Interventions Readmission Risk Prevention Plan 08/19/2020  Post Dischage Appt Complete  Medication Screening Complete  Transportation Screening Complete  Some recent data might be hidden

## 2021-12-03 NOTE — Discharge Summary (Signed)
Physician Discharge Summary  [name] [id number] DOB: January 11, 1926 DOA: 11/29/2021  PCP: Sharilyn Sites, MD  Admit date: 11/29/2021 Discharge date: 12/03/2021  Admitted From: Home  Disposition: Home  Recommendations for Outpatient Follow-up:  Follow up with PCP in 1-2 weeks Urology will schedule follow-up  Home Health: N/A Equipment/Devices: N/A.  Patient on home oxygen already present at home.  Discharge Condition: Stable CODE STATUS: DNR Diet recommendation: Low-salt diet  Discharge summary: 88 year old with history of hypertension, A-fib, legally blind with glaucoma presented to the ER on 1/28 with acute left flank pain and found to have obstructive stone at left ureteropelvic junction causing moderate hydronephrosis, E. coli UTI, WBC count of 22,000.  Patient was admitted with IV fluids and IV antibiotics, taken to operating room by urology on 1/30 for cystoscopy and left ureteral stent.  Patient does use 2 L oxygen at home.  She had episode of shortness of breath after procedure, however did well with trial of IV diuresis.  Acute UTI present on admission due to E. coli.  Admission urine culture positive for E. coli.  Operative urine culture positive for Enterococcus faecalis.  Patient already had clinical improvement with ceftriaxone.  Blood cultures negative.  Given clinical improvement on Rocephin, will continue total 2 weeks of antibiotics.  10 additional days. Patient status post cystoscopy and left ureteric stent placement.  Urology will schedule outpatient follow-up for further procedures.  Chronic hypoxemic respiratory failure: Patient does use oxygen at home and she is using 1 to 2 L of oxygen.  Sacral decubitus ulcer coccyx, stage II, lower back is stage I present on admission: Recommended.  Therapy at home.  Patient with good clinical improvement today.  She is able to go home.   Discharge Diagnoses:  Principal Problem:   Acute diastolic CHF (congestive heart  failure) (HCC) Active Problems:   Acute lower UTI   Left nephrolithiasis   Leukocytosis   Normocytic anemia   Essential hypertension   GERD (gastroesophageal reflux disease)   Glaucoma   Dehydration   Atrial fibrillation, chronic (HCC)   Decubitus ulcer of coccyx, stage 2 (HCC)   Decubitus ulcer of lower back, stage 1   Hydronephrosis of left kidney    Discharge Instructions  Discharge Instructions     Diet - low sodium heart healthy   Complete by: As directed    Discharge wound care:   Complete by: As directed    Keep pressure areas clean and dry.   Increase activity slowly   Complete by: As directed       Allergies as of 12/03/2021       Reactions   Iohexol Itching, Swelling, Rash   pt allergic to prednisone she needs IV premeds   Azithromycin    Doxycycline    Gentamicin    Ibandronic Acid    Nitrofurantoin    Penicillins    Prednisone    Strawberry (diagnostic)    Sulfa Antibiotics    Travatan [travoprost]    Xalatan [latanoprost]         Medication List     STOP taking these medications    diltiazem 180 MG 24 hr capsule Commonly known as: TIAZAC   guaiFENesin-dextromethorphan 100-10 MG/5ML syrup Commonly known as: ROBITUSSIN DM   levofloxacin 500 MG tablet Commonly known as: LEVAQUIN   sodium chloride 5 % ophthalmic solution Commonly known as: MURO 128   triamcinolone ointment 0.1 % Commonly known as: KENALOG       TAKE these medications  albuterol 108 (90 Base) MCG/ACT inhaler Commonly known as: VENTOLIN HFA Inhale 2 puffs into the lungs every 6 (six) hours.   Alphagan P 0.1 % Soln Generic drug: brimonidine Place 1 drop into both eyes 2 (two) times daily.   aspirin EC 81 MG tablet Take 81 mg by mouth daily. Swallow whole.   Calcium 600-200 MG-UNIT tablet Take 1 tablet by mouth daily.   cephALEXin 500 MG capsule Commonly known as: KEFLEX Take 1 capsule (500 mg total) by mouth 3 (three) times daily for 10 days.    cyanocobalamin 1000 MCG/ML injection Commonly known as: (VITAMIN B-12) Inject 1,000 mcg into the muscle every 30 (thirty) days.   desloratadine 5 MG tablet Commonly known as: CLARINEX Take 5 mg by mouth daily as needed.   diltiazem 180 MG 24 hr capsule Commonly known as: CARDIZEM CD Take 180 mg by mouth daily.   dorzolamide-timolol 22.3-6.8 MG/ML ophthalmic solution Commonly known as: COSOPT Place 1 drop into both eyes 2 (two) times daily.   ibuprofen 200 MG tablet Commonly known as: ADVIL Take 200 mg by mouth every 6 (six) hours as needed.   neomycin-polymyxin b-dexamethasone 3.5-10000-0.1 Susp Commonly known as: MAXITROL Place 1 drop into the left eye 2 (two) times daily.   omeprazole 20 MG capsule Commonly known as: PRILOSEC Take 20 mg by mouth daily.   PRESERVISION/LUTEIN PO Take 1 tablet by mouth 2 (two) times daily. What changed: Another medication with the same name was removed. Continue taking this medication, and follow the directions you see here.   Systane 0.4-0.3 % Soln Generic drug: Polyethyl Glycol-Propyl Glycol Apply 1 drop to eye every 6 (six) hours as needed.   TobraDex ophthalmic ointment Generic drug: tobramycin-dexamethasone Place 1 application into both eyes 3 (three) times daily.               Discharge Care Instructions  (From admission, onward)           Start     Ordered   12/03/21 0000  Discharge wound care:       Comments: Keep pressure areas clean and dry.   12/03/21 1245            Allergies  Allergen Reactions   Iohexol Itching, Swelling and Rash    pt allergic to prednisone she needs IV premeds   Azithromycin    Doxycycline    Gentamicin    Ibandronic Acid    Nitrofurantoin    Penicillins    Prednisone    Strawberry (Diagnostic)    Sulfa Antibiotics    Travatan [Travoprost]    Xalatan [Latanoprost]     Consultations: Urology   Procedures/Studies: DG C-Arm 1-60 Min-No Report  Result Date:  11/30/2021 Fluoroscopy was utilized by the requesting physician.  No radiographic interpretation.   CT RENAL STONE STUDY  Result Date: 11/29/2021 CLINICAL DATA:  Patient brought in by [facility] for left sided back pain/flank pain that started last night. EMS states that the family said patient has a hx of kidney stones. Patient given 30mg  Toradol with some relief. EXAM: CT ABDOMEN AND PELVIS WITHOUT CONTRAST TECHNIQUE: Multidetector CT imaging of the abdomen and pelvis was performed following the standard protocol without IV contrast. RADIATION DOSE REDUCTION: This exam was performed according to the departmental dose-optimization program which includes automated exposure control, adjustment of the mA and/or kV according to patient size and/or use of iterative reconstruction technique. COMPARISON:  03/29/2008. FINDINGS: Lower chest: Small to moderate right and small left pleural effusions  associated with dependent lower lobe opacity consistent with atelectasis. Hepatobiliary: No focal liver abnormality is seen. No gallstones, gallbladder wall thickening, or biliary dilatation. Pancreas: Unremarkable. No pancreatic ductal dilatation or surrounding inflammatory changes. Spleen: Normal in size without focal abnormality. Adrenals/Urinary Tract: No adrenal masses. Large stone, 2 cm in size, extends from the inferior left renal pelvis to the ureteropelvic junction. There is associated moderate left hydronephrosis and left perinephric stranding. Small nonobstructing stones in the posterior lower pole of the left kidney. No right intrarenal stones or hydronephrosis. Low-density mass arises from the upper pole the right kidney measuring 2.8 cm. There is a 1.4 cm low-density mass arising from the upper pole of the left kidney and a 3 cm low-density mass arising from the lower pole of the left kidney. These are consistent with cysts. Left ureter below the ureteropelvic stone is normal in course and in caliber. No additional  stones. Normal right ureter. Bladder is unremarkable. Stomach/Bowel: Moderate hiatal hernia, incompletely imaged. Stomach otherwise unremarkable. Small-bowel is unremarkable. Colon is mostly decompressed. No bowel wall thickening or inflammatory changes. No evidence of appendicitis. Vascular/Lymphatic: Dense aortic and branch vessel atherosclerotic calcifications. No aneurysm. No enlarged lymph nodes. Reproductive: Uterus and bilateral adnexa are unremarkable. Other: No hernia or ascites. Musculoskeletal: Loss vertebral body height throughout the visualized spine consistent with chronic fractures, most significantly of T12 and L1. L1 fracture is increased from the prior CT and the remaining fractures are new, but none appear acute. No bone lesions. IMPRESSION: 1. 2 cm stone at the left ureteropelvic junction causes moderate left hydronephrosis. 2. No other acute abnormality within the abdomen or pelvis. 3. Small nonobstructing stones in the lower pole of the left kidney. 4. Multiple vertebral fractures from T11-L5, all presumed old, however most new since the prior CT. 5. Extensive aortic and branch vessel atherosclerotic calcifications. Electronically Signed   By: Lajean Manes M.D.   On: 11/29/2021 13:33   (Echo, Carotid, EGD, Colonoscopy, ERCP)    Subjective: Patient seen and examined.  Daughter at the bedside.  Patient denied any complaints.  Afebrile overnight.  Denies any abdominal pain nausea vomiting or discomfort.  Urinating without problem.  She is on 2 L oxygen at home.  Her daughter was not able to secure a transportation home, patient became very emotional and started crying when her daughter mentioned about going home tomorrow instead of today.  We will try to arrange transportation home for her.   Discharge Exam: Vitals:   12/03/21 0513 12/03/21 0627  BP: 138/69 (!) 145/69  Pulse: 81 77  Resp: 15 16  Temp: 98.2 F (36.8 C) 97.8 F (36.6 C)  SpO2: 96% 95%   Vitals:   12/02/21 1238  12/02/21 2154 12/03/21 0513 12/03/21 0627  BP: 118/64 129/71 138/69 (!) 145/69  Pulse: 77 81 81 77  Resp: 16 18 15 16   Temp:  98.7 F (37.1 C) 98.2 F (36.8 C) 97.8 F (36.6 C)  TempSrc:  Oral Oral Oral  SpO2: 96% 95% 96% 95%  Weight:      Height:        General: Pt is alert, awake, not in acute distress Thinly built.  She is on 2 L oxygen that she uses at home. She is blind.  Alert oriented x4.  Not in any distress. Cardiovascular: RRR, S1/S2 +, no rubs, no gallops Respiratory: CTA bilaterally, no wheezing, no rhonchi Abdominal: Soft, NT, ND, bowel sounds + Extremities: no edema, no cyanosis    The results of  significant diagnostics from this hospitalization (including imaging, microbiology, ancillary and laboratory) are listed below for reference.     Microbiology: Recent Results (from the past 240 hour(s))  Urine Culture     Status: Abnormal   Collection Time: 11/29/21  5:02 PM   Specimen: Urine, Clean Catch  Result Value Ref Range Status   Specimen Description   Final    URINE, CLEAN CATCH Performed at [facility], [address] [place], [place] [address]    Special Requests   Final    NONE Performed at [facility], [address] [place], [place] [address]    Culture >=100,000 COLONIES/mL ESCHERICHIA COLI (A)  Final   Report Status 12/02/2021 FINAL  Final   Organism ID, Bacteria ESCHERICHIA COLI (A)  Final      Susceptibility   Escherichia coli - MIC*    AMPICILLIN 8 SENSITIVE Sensitive     CEFAZOLIN <=4 SENSITIVE Sensitive     CEFEPIME <=0.12 SENSITIVE Sensitive     CEFTRIAXONE <=0.25 SENSITIVE Sensitive     CIPROFLOXACIN 1 RESISTANT Resistant     GENTAMICIN <=1 SENSITIVE Sensitive     IMIPENEM <=0.25 SENSITIVE Sensitive     NITROFURANTOIN <=16 SENSITIVE Sensitive     TRIMETH/SULFA >=320 RESISTANT Resistant     AMPICILLIN/SULBACTAM 4 SENSITIVE Sensitive     PIP/TAZO <=4 SENSITIVE Sensitive     * >=100,000 COLONIES/mL ESCHERICHIA COLI  Resp Panel by  RT-PCR (Flu A&B, Covid) Nasopharyngeal Swab     Status: None   Collection Time: 11/29/21  7:21 PM   Specimen: Nasopharyngeal Swab; Nasopharyngeal(NP) swabs in vial transport medium  Result Value Ref Range Status   SARS Coronavirus 2 by RT PCR NEGATIVE NEGATIVE Final    Comment: (NOTE) SARS-CoV-2 target nucleic acids are NOT DETECTED.  The SARS-CoV-2 RNA is generally detectable in upper respiratory specimens during the acute phase of infection. The lowest concentration of SARS-CoV-2 viral copies this assay can detect is 138 copies/mL. A negative result does not preclude SARS-Cov-2 infection and should not be used as the sole basis for treatment or other patient management decisions. A negative result may occur with  improper specimen collection/handling, submission of specimen other than nasopharyngeal swab, presence of viral mutation(s) within the areas targeted by this assay, and inadequate number of viral copies(<138 copies/mL). A negative result must be combined with clinical observations, patient history, and epidemiological information. The expected result is Negative.  Fact Sheet for Patients:  EntrepreneurPulse.com.au  Fact Sheet for Healthcare Providers:  IncredibleEmployment.be  This test is no t yet approved or cleared by the [place] FDA and  has been authorized for detection and/or diagnosis of SARS-CoV-2 by FDA under an Emergency Use Authorization (EUA). This EUA will remain  in effect (meaning this test can be used) for the duration of the COVID-19 declaration under Section 564(b)(1) of the Act, 21 U.S.C.section 360bbb-3(b)(1), unless the authorization is terminated  or revoked sooner.       Influenza A by PCR NEGATIVE NEGATIVE Final   Influenza B by PCR NEGATIVE NEGATIVE Final    Comment: (NOTE) The Xpert Xpress SARS-CoV-2/FLU/RSV plus assay is intended as an aid in the diagnosis of influenza from Nasopharyngeal swab  specimens and should not be used as a sole basis for treatment. Nasal washings and aspirates are unacceptable for Xpert Xpress SARS-CoV-2/FLU/RSV testing.  Fact Sheet for Patients: EntrepreneurPulse.com.au  Fact Sheet for Healthcare Providers: IncredibleEmployment.be  This test is not yet approved or cleared by the [place] and  has been authorized for detection and/or diagnosis of SARS-CoV-2 by FDA under an Emergency Use Authorization (EUA). This EUA will remain in effect (meaning this test can be used) for the duration of the COVID-19 declaration under Section 564(b)(1) of the Act, 21 U.S.C. section 360bbb-3(b)(1), unless the authorization is terminated or revoked.  Performed at [facility], [address], [place], [place] [address]   Surgical pcr screen     Status: Abnormal   Collection Time: 11/30/21 11:46 AM   Specimen: Nasal Mucosa; Nasal Swab  Result Value Ref Range Status   MRSA, PCR POSITIVE (A) NEGATIVE Final    Comment: RESULT CALLED TO, READ BACK BY AND VERIFIED WITH: MAYS,D. RN AT 1620 11/30/21 MULLINS,T    Staphylococcus aureus POSITIVE (A) NEGATIVE Final    Comment: RESULT CALLED TO, READ BACK BY AND VERIFIED WITH: MAYS,D. RN AT 1620 11/30/21 MULLINS,T (NOTE) The Xpert SA Assay (FDA approved for NASAL specimens in patients 46 years of age and older), is one component of a comprehensive surveillance program. It is not intended to diagnose infection nor to guide or monitor treatment. Performed at [facility], [place] [address], [place], [place] [address]   Urine Culture     Status: Abnormal   Collection Time: 11/30/21  2:21 PM   Specimen: Urine, Cystoscope  Result Value Ref Range Status   Specimen Description   Final    CYSTOSCOPY Performed at [facility] [address], [place], [place] [address]    Special Requests   Final    NONE Performed at [facility], [place] [address] [place], [place] [address]    Culture (A)  Final    >=100,000 COLONIES/mL ESCHERICHIA COLI >=100,000 COLONIES/mL ENTEROCOCCUS FAECALIS    Report Status 12/03/2021 FINAL  Final   Organism ID, Bacteria ESCHERICHIA COLI (A)  Final   Organism ID, Bacteria ENTEROCOCCUS FAECALIS (A)  Final      Susceptibility   Escherichia coli - MIC*    AMPICILLIN 8 SENSITIVE Sensitive     CEFAZOLIN <=4 SENSITIVE Sensitive     CEFEPIME <=0.12 SENSITIVE Sensitive     CEFTAZIDIME <=1 SENSITIVE Sensitive     CEFTRIAXONE <=0.25 SENSITIVE Sensitive     CIPROFLOXACIN 1 RESISTANT Resistant     GENTAMICIN <=1 SENSITIVE Sensitive     IMIPENEM <=0.25 SENSITIVE Sensitive     TRIMETH/SULFA >=320 RESISTANT Resistant     AMPICILLIN/SULBACTAM 4 SENSITIVE Sensitive     PIP/TAZO <=4 SENSITIVE Sensitive     * >=100,000 COLONIES/mL ESCHERICHIA COLI   Enterococcus faecalis - MIC*    AMPICILLIN <=2 SENSITIVE Sensitive     VANCOMYCIN 1 SENSITIVE Sensitive     GENTAMICIN SYNERGY SENSITIVE Sensitive     * >=100,000 COLONIES/mL ENTEROCOCCUS FAECALIS  Gram stain     Status: None   Collection Time: 11/30/21  2:21 PM   Specimen: Urine, Cystoscope  Result Value Ref Range Status   Specimen Description   Final    CYSTOSCOPY Performed at [facility] [address], [place], [place] [address]    Special Requests   Final    NONE Performed at [facility], [place] [address] [place], [place] [address]    Gram Stain   Final    WBC PRESENT,BOTH PMN AND MONONUCLEAR GRAM NEGATIVE RODS CYTOSPIN SMEAR Performed at [facility], [place] [address], [place], [place] [address]    Report Status 11/30/2021 FINAL  Final     Labs:  BNP (last 3 results) Recent Labs    12/01/21 1106  BNP [id number]   Basic Metabolic Panel: Recent Labs  Lab 11/29/21 1322 11/30/21 0446 12/01/21 1106 12/02/21 0424 12/03/21 0440  NA 140 135 139 139 139  K 3.9 4.0 4.2 3.6 3.6  CL 111  110 115* 110 109  CO2 24 15* 18* 19* 22  GLUCOSE 104* 90 148* 92 88  BUN 26* 29* 34* 34* 28*  CREATININE 0.98 1.25* 0.99 0.90 0.86  CALCIUM 8.7* 7.4* 7.3* 8.0* 8.1*  MG  --  1.3*  --   --   --   PHOS  --  3.2  --   --   --    Liver Function Tests: Recent Labs  Lab 11/29/21 1322 11/30/21 0446  AST 19 34  ALT 12 11  ALKPHOS 63 64  BILITOT 0.5 0.8  PROT 6.7 5.4*  ALBUMIN 3.0* 2.2*   Recent Labs  Lab 11/29/21 1322  LIPASE 45   No results for input(s): AMMONIA in the last 168 hours. CBC: Recent Labs  Lab 11/29/21 1322 11/30/21 0446 12/01/21 1106 12/02/21 0424 12/03/21 0440  WBC 18.4* 24.3* 22.3* 18.5* 11.6*  NEUTROABS 16.1*  --   --   --   --   HGB 10.8* 9.3* 9.7* 9.8* 10.4*  HCT 34.8* 30.5* 31.0* 30.7* 32.7*  MCV 80.7 80.7 78.3* 76.2* 76.2*  PLT 458* 292 267 265 267   Cardiac Enzymes: No results for input(s): CKTOTAL, CKMB, CKMBINDEX, TROPONINI in the last 168 hours. BNP: Invalid input(s): POCBNP CBG: No results for input(s): GLUCAP in the last 168 hours. D-Dimer No results for input(s): DDIMER in the last 72 hours. Hgb A1c No results for input(s): HGBA1C in the last 72 hours. Lipid Profile No results for input(s): CHOL, HDL, LDLCALC, TRIG, CHOLHDL, LDLDIRECT in the last 72 hours. Thyroid function studies No results for input(s): TSH, T4TOTAL, T3FREE, THYROIDAB in the last 72 hours.  Invalid input(s): FREET3 Anemia work up No results for input(s): VITAMINB12, FOLATE, FERRITIN, TIBC, IRON, RETICCTPCT in the last 72 hours. Urinalysis    Component Value Date/Time   COLORURINE YELLOW 11/29/2021 1640   APPEARANCEUR CLEAR 11/29/2021 1640   LABSPEC 1.020 11/29/2021 1640   PHURINE 6.0 11/29/2021 1640   GLUCOSEU NEGATIVE 11/29/2021 1640   HGBUR TRACE (A) 11/29/2021 1640   BILIRUBINUR NEGATIVE 11/29/2021 1640   KETONESUR NEGATIVE 11/29/2021 1640   PROTEINUR 30 (A) 11/29/2021 1640   NITRITE POSITIVE (A) 11/29/2021 1640   LEUKOCYTESUR SMALL (A) 11/29/2021 1640    Sepsis Labs Invalid input(s): PROCALCITONIN,  WBC,  LACTICIDVEN Microbiology Recent Results (from the past 240 hour(s))  Urine Culture     Status: Abnormal   Collection Time: 11/29/21  5:02 PM   Specimen: Urine, Clean Catch  Result Value Ref Range Status   Specimen Description   Final    URINE, CLEAN CATCH Performed at [facility], [address] [place], [place] [address]    Special Requests   Final    NONE Performed at [facility], [address], [place], [place] [address]    Culture >=100,000 COLONIES/mL ESCHERICHIA COLI (A)  Final   Report Status 12/02/2021 FINAL  Final   Organism ID, Bacteria ESCHERICHIA COLI (A)  Final      Susceptibility   Escherichia coli - MIC*    AMPICILLIN 8 SENSITIVE Sensitive     CEFAZOLIN <=4 SENSITIVE Sensitive     CEFEPIME <=0.12 SENSITIVE Sensitive     CEFTRIAXONE <=0.25 SENSITIVE Sensitive  CIPROFLOXACIN 1 RESISTANT Resistant     GENTAMICIN <=1 SENSITIVE Sensitive     IMIPENEM <=0.25 SENSITIVE Sensitive     NITROFURANTOIN <=16 SENSITIVE Sensitive     TRIMETH/SULFA >=320 RESISTANT Resistant     AMPICILLIN/SULBACTAM 4 SENSITIVE Sensitive     PIP/TAZO <=4 SENSITIVE Sensitive     * >=100,000 COLONIES/mL ESCHERICHIA COLI  Resp Panel by RT-PCR (Flu A&B, Covid) Nasopharyngeal Swab     Status: None   Collection Time: 11/29/21  7:21 PM   Specimen: Nasopharyngeal Swab; Nasopharyngeal(NP) swabs in vial transport medium  Result Value Ref Range Status   SARS Coronavirus 2 by RT PCR NEGATIVE NEGATIVE Final    Comment: (NOTE) SARS-CoV-2 target nucleic acids are NOT DETECTED.  The SARS-CoV-2 RNA is generally detectable in upper respiratory specimens during the acute phase of infection. The lowest concentration of SARS-CoV-2 viral copies this assay can detect is 138 copies/mL. A negative result does not preclude SARS-Cov-2 infection and should not be used as the sole basis for treatment or other patient management decisions. A negative result  may occur with  improper specimen collection/handling, submission of specimen other than nasopharyngeal swab, presence of viral mutation(s) within the areas targeted by this assay, and inadequate number of viral copies(<138 copies/mL). A negative result must be combined with clinical observations, patient history, and epidemiological information. The expected result is Negative.  Fact Sheet for Patients:  EntrepreneurPulse.com.au  Fact Sheet for Healthcare Providers:  IncredibleEmployment.be  This test is no t yet approved or cleared by the [place] FDA and  has been authorized for detection and/or diagnosis of SARS-CoV-2 by FDA under an Emergency Use Authorization (EUA). This EUA will remain  in effect (meaning this test can be used) for the duration of the COVID-19 declaration under Section 564(b)(1) of the Act, 21 U.S.C.section 360bbb-3(b)(1), unless the authorization is terminated  or revoked sooner.       Influenza A by PCR NEGATIVE NEGATIVE Final   Influenza B by PCR NEGATIVE NEGATIVE Final    Comment: (NOTE) The Xpert Xpress SARS-CoV-2/FLU/RSV plus assay is intended as an aid in the diagnosis of influenza from Nasopharyngeal swab specimens and should not be used as a sole basis for treatment. Nasal washings and aspirates are unacceptable for Xpert Xpress SARS-CoV-2/FLU/RSV testing.  Fact Sheet for Patients: EntrepreneurPulse.com.au  Fact Sheet for Healthcare Providers: IncredibleEmployment.be  This test is not yet approved or cleared by the [place] FDA and has been authorized for detection and/or diagnosis of SARS-CoV-2 by FDA under an Emergency Use Authorization (EUA). This EUA will remain in effect (meaning this test can be used) for the duration of the COVID-19 declaration under Section 564(b)(1) of the Act, 21 U.S.C. section 360bbb-3(b)(1), unless the authorization is terminated  or revoked.  Performed at [facility], [address] [place], [place] [address]   Surgical pcr screen     Status: Abnormal   Collection Time: 11/30/21 11:46 AM   Specimen: Nasal Mucosa; Nasal Swab  Result Value Ref Range Status   MRSA, PCR POSITIVE (A) NEGATIVE Final    Comment: RESULT CALLED TO, READ BACK BY AND VERIFIED WITH: MAYS,D. RN AT 1620 11/30/21 MULLINS,T    Staphylococcus aureus POSITIVE (A) NEGATIVE Final    Comment: RESULT CALLED TO, READ BACK BY AND VERIFIED WITH: MAYS,D. RN AT 1620 11/30/21 MULLINS,T (NOTE) The Xpert SA Assay (FDA approved for NASAL specimens in patients 87 years of age and older), is one component of a comprehensive surveillance program. It is not  intended to diagnose infection nor to guide or monitor treatment. Performed at [facility], [place] [address] [place], [place] [address]   Urine Culture     Status: Abnormal   Collection Time: 11/30/21  2:21 PM   Specimen: Urine, Cystoscope  Result Value Ref Range Status   Specimen Description   Final    CYSTOSCOPY Performed at [facility] [address] [place], [place] [address]    Special Requests   Final    NONE Performed at [facility], [place] [address] [place], [place] [address]    Culture (A)  Final    >=100,000 COLONIES/mL ESCHERICHIA COLI >=100,000 COLONIES/mL ENTEROCOCCUS FAECALIS    Report Status 12/03/2021 FINAL  Final   Organism ID, Bacteria ESCHERICHIA COLI (A)  Final   Organism ID, Bacteria ENTEROCOCCUS FAECALIS (A)  Final      Susceptibility   Escherichia coli - MIC*    AMPICILLIN 8 SENSITIVE Sensitive     CEFAZOLIN <=4 SENSITIVE Sensitive     CEFEPIME <=0.12 SENSITIVE Sensitive     CEFTAZIDIME <=1 SENSITIVE Sensitive     CEFTRIAXONE <=0.25 SENSITIVE Sensitive     CIPROFLOXACIN 1 RESISTANT Resistant     GENTAMICIN <=1 SENSITIVE Sensitive     IMIPENEM <=0.25 SENSITIVE Sensitive     TRIMETH/SULFA >=320 RESISTANT  Resistant     AMPICILLIN/SULBACTAM 4 SENSITIVE Sensitive     PIP/TAZO <=4 SENSITIVE Sensitive     * >=100,000 COLONIES/mL ESCHERICHIA COLI   Enterococcus faecalis - MIC*    AMPICILLIN <=2 SENSITIVE Sensitive     VANCOMYCIN 1 SENSITIVE Sensitive     GENTAMICIN SYNERGY SENSITIVE Sensitive     * >=100,000 COLONIES/mL ENTEROCOCCUS FAECALIS  Gram stain     Status: None   Collection Time: 11/30/21  2:21 PM   Specimen: Urine, Cystoscope  Result Value Ref Range Status   Specimen Description   Final    CYSTOSCOPY Performed at [facility] [address], [place], [place] [address]    Special Requests   Final    NONE Performed at [facility], [place] [address], [place], [place] [address]    Gram Stain   Final    WBC PRESENT,BOTH PMN AND MONONUCLEAR GRAM NEGATIVE RODS CYTOSPIN SMEAR Performed at [facility], [place] [address] [place], [place] [address]    Report Status 11/30/2021 FINAL  Final     Time coordinating discharge: 40 minutes  SIGNED:   Barb Merino, MD  [facility] 12/03/2021, 12:46 PM

## 2021-12-03 NOTE — Progress Notes (Signed)
PT Cancellation Note/ Screen  Patient Details Name: BUELAH BARTOLINI MRN: [id number] DOB: 02-Mar-1926   Cancelled Treatment:    Reason Eval/Treat Not Completed: PT screened, no needs identified, will sign off Pt has been up with RN and daughter in room.  Pt discharged to home and has no acute PT needs at this time.  PT to sign off.   Myrtis Hopping [place] 12/03/2021, 2:53 PM Jannette Spanner PT, DPT Acute Rehabilitation Services Pager: [phone] Office: [phone]

## 2021-12-03 NOTE — Plan of Care (Signed)

## 2021-12-03 NOTE — TOC Transition Note (Addendum)
Transition of Care [facility]) - CM/SW Discharge Note   Patient Details  Name: [name] MRN: [id number] Date of Birth: January 25, 1926  [facility] CM/SW Contact:  Golda Acre, RN Phone Number: 12/03/2021, 1:28 PM   Clinical Narrative:    Patient dcd to home. Tct-daughter at home phone provided.  Message left that patient will need transport to home and portable o2 tank since she o2 at home.   Final next level of care: Home/Self Care Barriers to Discharge: Barriers Resolved   Patient Goals and CMS Choice Patient states their goals for this hospitalization and ongoing recovery are:: to go home CMS Medicare.gov Compare Post Acute Care list provided to:: Patient Choice offered to / list presented to : Patient  Discharge Placement                       Discharge Plan and Services   Discharge Planning Services: CM Consult                                 Social Determinants of Health (SDOH) Interventions     Readmission Risk Interventions Readmission Risk Prevention Plan 08/19/2020  Post Dischage Appt Complete  Medication Screening Complete  Transportation Screening Complete  Some recent data might be hidden

## 2021-12-03 NOTE — Plan of Care (Signed)
°  Problem: Health Behavior/Discharge Planning: Goal: Ability to manage health-related needs will improve Outcome: Adequate for Discharge   Problem: Clinical Measurements: Goal: Will remain free from infection Outcome: Adequate for Discharge   Problem: Activity: Goal: Risk for activity intolerance will decrease Outcome: Adequate for Discharge   Problem: Nutrition: Goal: Adequate nutrition will be maintained Outcome: Adequate for Discharge   Problem: Skin Integrity: Goal: Risk for impaired skin integrity will decrease Outcome: Adequate for Discharge

## 2021-12-03 NOTE — Progress Notes (Signed)
Received report from ongoing  night nurse.  Assumed care of patient. Assessed. Pt is resting in bed.Patient with No c/o of pain at this time. Alert to person place and time.  Patient request for something to drink. Drink was given. Tolerated well. Repositioned in bed.  No concerns at this time. Will cont to monitor.

## 2021-12-03 NOTE — Care Management Important Message (Signed)
Important Message  Patient Details IM Letter placed in Patients room. Name: [name] MRN: [id number] Date of Birth: Jul 19, 1926   Medicare Important Message Given:  Yes     Caren Macadam 12/03/2021, 11:20 AM

## 2021-12-03 NOTE — Progress Notes (Signed)
D/C instructions reviewed w/ pt dtr at bedside. Dtr verbalizes understanding and all questions answered. Dtr in possession of d/c packet and all personal belongings. Awaiting pt's niece to assist w/ transfer home.

## 2021-12-10 DIAGNOSIS — E441 Mild protein-calorie malnutrition: Secondary | ICD-10-CM | POA: Diagnosis not present

## 2021-12-10 DIAGNOSIS — I509 Heart failure, unspecified: Secondary | ICD-10-CM | POA: Diagnosis not present

## 2021-12-10 DIAGNOSIS — E782 Mixed hyperlipidemia: Secondary | ICD-10-CM | POA: Diagnosis not present

## 2021-12-10 DIAGNOSIS — M1991 Primary osteoarthritis, unspecified site: Secondary | ICD-10-CM | POA: Diagnosis not present

## 2021-12-10 DIAGNOSIS — D509 Iron deficiency anemia, unspecified: Secondary | ICD-10-CM | POA: Diagnosis not present

## 2021-12-10 DIAGNOSIS — M150 Primary generalized (osteo)arthritis: Secondary | ICD-10-CM | POA: Diagnosis not present

## 2021-12-10 DIAGNOSIS — M810 Age-related osteoporosis without current pathological fracture: Secondary | ICD-10-CM | POA: Diagnosis not present

## 2021-12-10 DIAGNOSIS — L89159 Pressure ulcer of sacral region, unspecified stage: Secondary | ICD-10-CM | POA: Diagnosis not present

## 2021-12-10 DIAGNOSIS — J449 Chronic obstructive pulmonary disease, unspecified: Secondary | ICD-10-CM | POA: Diagnosis not present

## 2021-12-10 DIAGNOSIS — D519 Vitamin B12 deficiency anemia, unspecified: Secondary | ICD-10-CM | POA: Diagnosis not present

## 2021-12-10 DIAGNOSIS — I10 Essential (primary) hypertension: Secondary | ICD-10-CM | POA: Diagnosis not present

## 2021-12-19 DIAGNOSIS — U071 COVID-19: Secondary | ICD-10-CM | POA: Diagnosis not present

## 2021-12-19 DIAGNOSIS — N201 Calculus of ureter: Secondary | ICD-10-CM | POA: Diagnosis not present

## 2021-12-30 DIAGNOSIS — E782 Mixed hyperlipidemia: Secondary | ICD-10-CM | POA: Diagnosis not present

## 2021-12-30 DIAGNOSIS — J449 Chronic obstructive pulmonary disease, unspecified: Secondary | ICD-10-CM | POA: Diagnosis not present

## 2021-12-30 DIAGNOSIS — I110 Hypertensive heart disease with heart failure: Secondary | ICD-10-CM | POA: Diagnosis not present

## 2022-01-07 ENCOUNTER — Other Ambulatory Visit: Payer: Self-pay | Admitting: Urology

## 2022-01-16 DIAGNOSIS — U071 COVID-19: Secondary | ICD-10-CM | POA: Diagnosis not present

## 2022-02-13 ENCOUNTER — Other Ambulatory Visit (HOSPITAL_COMMUNITY): Payer: Medicare Other

## 2022-02-16 NOTE — Progress Notes (Addendum)
COVID Vaccine Completed: yes x5 ?Date COVID Vaccine completed: ?Has received booster: ?COVID vaccine manufacturer: [employer]  ? ?Date of COVID positive in last 90 days: ? ?PCP - Sharilyn Sites, MD ?Cardiologist - n/a ? ?Chest x-ray - n/a ?EKG - 02/17/22 Epic/chart ?Stress Test - n/a ?ECHO - n/a ?Cardiac Cath - n/a ?Pacemaker/ICD device last checked: n/a ?Spinal Cord Stimulator: n/a ? ?Bowel Prep - no ? ?Sleep Study - n/a ?CPAP -  ? ?Fasting Blood Sugar - n/a ?Checks Blood Sugar _____ times a day ? ?Blood Thinner Instructions: ?Aspirin Instructions: ASA 81, no instructions, patient has appointment with surgeons office today ?Last Dose: ? ?Activity level: Can go up a flight of stairs and perform activities of daily living without stopping and without symptoms. SOB and chest pain with exertion   ? ?Anesthesia review: HTN, CHF, COPD, O2 2-L hs ? ?Patient denies shortness of breath, fever, cough and chest pain at PAT appointment ? ? ?Patient verbalized understanding of instructions that were given to them at the PAT appointment. Patient was also instructed that they will need to review over the PAT instructions again at home before surgery.  ?

## 2022-02-16 NOTE — Patient Instructions (Addendum)
DUE TO COVID-19 ONLY ONE VISITOR  (aged 86 and older)  IS ALLOWED TO COME WITH YOU AND STAY IN THE WAITING ROOM ONLY DURING PRE OP AND PROCEDURE.   ?**NO VISITORS ARE ALLOWED IN THE SHORT STAY AREA OR RECOVERY ROOM!!** ? ?IF YOU WILL BE ADMITTED INTO THE HOSPITAL YOU ARE ALLOWED ONLY TWO SUPPORT PEOPLE DURING VISITATION HOURS ONLY (7 AM -8PM)   ?The support person(s) must pass our screening, gel in and out, and wear a mask at all times, including in the patient?s room. ?Patients must also wear a mask when staff or their support person are in the room. ?Visitors GUEST BADGE MUST BE WORN VISIBLY  ?One adult visitor may remain with you overnight and MUST be in the room by 8 P.M. ?  ? ? Your procedure is scheduled on: 02/26/22 ? ? Report to [facility] Main Entrance ? ?  Report to admitting at 11:45 AM ? ? Call this number if you have problems the morning of surgery [phone] ? ? Do not eat food :After Midnight. ? ? After Midnight you may have the following liquids until 11:00 AM DAY OF SURGERY ? ?Water ?Black Coffee (sugar ok, NO MILK/CREAM OR CREAMERS)  ?Tea (sugar ok, NO MILK/CREAM OR CREAMERS) regular and decaf                             ?Plain Jell-O (NO RED)                                           ?Fruit ices (not with fruit pulp, NO RED)                                     ?Popsicles (NO RED)                                                                  ?Juice: apple, WHITE grape, WHITE cranberry ?Sports drinks like Gatorade (NO RED) ?Clear broth(vegetable,chicken,beef) ? ?FOLLOW BOWEL PREP AND ANY ADDITIONAL PRE OP INSTRUCTIONS YOU RECEIVED FROM YOUR SURGEON'S OFFICE!!! ?  ?  ?Oral Hygiene is also important to reduce your risk of infection.                                    ?Remember - BRUSH YOUR TEETH THE MORNING OF SURGERY WITH YOUR REGULAR TOOTHPASTE ? ? Take these medicines the morning of surgery with A SIP OF WATER: Diltiazem, Omeprazole, Eye drops ?                  ?           You may  not have any metal on your body including hair pins, jewelry, and body piercing ? ?           Do not wear make-up, lotions, powders, perfumes, or deodorant ? ?Do not wear nail polish including gel and S&S, artificial/acrylic nails, or any other type  of covering on natural nails including finger and toenails. If you have artificial nails, gel coating, etc. that needs to be removed by a nail salon please have this removed prior to surgery or surgery may need to be canceled/ delayed if the surgeon/ anesthesia feels like they are unable to be safely monitored.  ? ?Do not shave  48 hours prior to surgery.  ? ? Do not bring valuables to the hospital. [facility] IS NOT ?            RESPONSIBLE   FOR VALUABLES. ? ? Contacts, dentures or bridgework may not be worn into surgery. ? ? Bring small overnight bag day of surgery. ? ?            Please read over the following fact sheets you were given: IF YOU HAVE QUESTIONS ABOUT YOUR PRE-OP INSTRUCTIONS PLEASE CALL [phone]- Fleet Contras ? ?   [facility] - Preparing for Surgery ?Before surgery, you can play an important role.  Because skin is not sterile, your skin needs to be as free of germs as possible.  You can reduce the number of germs on your skin by washing with CHG (chlorahexidine gluconate) soap before surgery.  CHG is an antiseptic cleaner which kills germs and bonds with the skin to continue killing germs even after washing. ?Please DO NOT use if you have an allergy to CHG or antibacterial soaps.  If your skin becomes reddened/irritated stop using the CHG and inform your nurse when you arrive at Short Stay. ?Do not shave (including legs and underarms) for at least 48 hours prior to the first CHG shower.  You may shave your face/neck. ? ?Please follow these instructions carefully: ? 1.  Shower with CHG Soap the night before surgery and the  morning of surgery. ? 2.  If you choose to wash your hair, wash your hair first as usual with your normal  shampoo. ? 3.  After you  shampoo, rinse your hair and body thoroughly to remove the shampoo.                            ? 4.  Use CHG as you would any other liquid soap.  You can apply chg directly to the skin and wash.  Gently with a scrungie or clean washcloth. ? 5.  Apply the CHG Soap to your body ONLY FROM THE NECK DOWN.   Do   not use on face/ open      ?                     Wound or open sores. Avoid contact with eyes, ears mouth and   genitals (private parts).  ?                     Engineering geologist,  Genitals (private parts) with your normal soap. ?            6.  Wash thoroughly, paying special attention to the area where your    surgery  will be performed. ? 7.  Thoroughly rinse your body with warm water from the neck down. ? 8.  DO NOT shower/wash with your normal soap after using and rinsing off the CHG Soap. ?               9.  Pat yourself dry with a clean towel. ?  10.  Wear clean pajamas. ?           11.  Place clean sheets on your bed the night of your first shower and do not  sleep with pets. ?Day of Surgery : ?Do not apply any lotions/deodorants the morning of surgery.  Please wear clean clothes to the hospital/surgery center. ? ?FAILURE TO FOLLOW THESE INSTRUCTIONS MAY RESULT IN THE CANCELLATION OF YOUR SURGERY ? ?PATIENT SIGNATURE_________________________________ ? ?NURSE SIGNATURE__________________________________ ? ?________________________________________________________________________  ?

## 2022-02-17 ENCOUNTER — Encounter (HOSPITAL_COMMUNITY): Payer: Self-pay

## 2022-02-17 ENCOUNTER — Encounter (HOSPITAL_COMMUNITY)
Admission: RE | Admit: 2022-02-17 | Discharge: 2022-02-17 | Disposition: A | Discharge status: Home / Self Care | Payer: Medicare Other | Source: Ambulatory Visit | Attending: Urology | Admitting: Urology

## 2022-02-17 VITALS — BP 112/59 | HR 66 | Ht <= 58 in | Wt 97.0 lb

## 2022-02-17 DIAGNOSIS — R8271 Bacteriuria: Secondary | ICD-10-CM | POA: Diagnosis not present

## 2022-02-17 DIAGNOSIS — Z01818 Encounter for other preprocedural examination: Secondary | ICD-10-CM | POA: Diagnosis not present

## 2022-02-17 DIAGNOSIS — I5031 Acute diastolic (congestive) heart failure: Secondary | ICD-10-CM

## 2022-02-17 DIAGNOSIS — N201 Calculus of ureter: Secondary | ICD-10-CM | POA: Diagnosis not present

## 2022-02-17 HISTORY — DX: Chronic obstructive pulmonary disease, unspecified: J44.9

## 2022-02-17 HISTORY — DX: Gastro-esophageal reflux disease without esophagitis: K21.9

## 2022-02-17 HISTORY — DX: Angina pectoris, unspecified: I20.9

## 2022-02-17 HISTORY — DX: Personal history of other diseases of the digestive system: Z87.19

## 2022-02-17 LAB — CBC
HCT: 33.9 % — ABNORMAL LOW (ref 36.0–46.0)
Hemoglobin: 10.5 g/dL — ABNORMAL LOW (ref 12.0–15.0)
MCH: 24.9 pg — ABNORMAL LOW (ref 26.0–34.0)
MCHC: 31 g/dL (ref 30.0–36.0)
MCV: 80.5 fL (ref 80.0–100.0)
Platelets: 566 10*3/uL — ABNORMAL HIGH (ref 150–400)
RBC: 4.21 MIL/uL (ref 3.87–5.11)
RDW: 19.4 % — ABNORMAL HIGH (ref 11.5–15.5)
WBC: 10.3 10*3/uL (ref 4.0–10.5)
nRBC: 0 % (ref 0.0–0.2)

## 2022-02-17 LAB — BASIC METABOLIC PANEL
Anion gap: 4 — ABNORMAL LOW (ref 5–15)
BUN: 23 mg/dL (ref 8–23)
CO2: 23 mmol/L (ref 22–32)
Calcium: 9 mg/dL (ref 8.9–10.3)
Chloride: 113 mmol/L — ABNORMAL HIGH (ref 98–111)
Creatinine, Ser: 0.73 mg/dL (ref 0.44–1.00)
GFR, Estimated: >60 mL/min (ref >60)
Glucose, Bld: 106 mg/dL — ABNORMAL HIGH (ref 70–99)
Potassium: 3.7 mmol/L (ref 3.5–5.1)
Sodium: 140 mmol/L (ref 135–145)

## 2022-02-18 DIAGNOSIS — Z961 Presence of intraocular lens: Secondary | ICD-10-CM | POA: Diagnosis not present

## 2022-02-18 DIAGNOSIS — H179 Unspecified corneal scar and opacity: Secondary | ICD-10-CM | POA: Diagnosis not present

## 2022-02-18 DIAGNOSIS — H18422 Band keratopathy, left eye: Secondary | ICD-10-CM | POA: Diagnosis not present

## 2022-02-25 ENCOUNTER — Other Ambulatory Visit: Payer: Self-pay | Admitting: Urology

## 2022-02-25 NOTE — H&P (Signed)
? ? ?Office Visit Report     02/17/2022  ? ?-------------------------------------------------------------------------------- ?  ?[name]  ?MRN: [id number]  ?DOB: 18-Sep-1926, 88 year old Female  ?SSN: [id number]  ? PRIMARY CARE:  Dawn Crutch, MD  ?REFERRING:  [name], Lowella Bandy  ?PROVIDER:  Heloise Johnston, M.D.  ?TREATING:  Dawn Crews, NP  ?LOCATION:  [name] [phone]  ?  ? ?-------------------------------------------------------------------------------- ?  ?CC/HPI: Left UPJ calculus  ? ?[name] is a very pleasant 88 year old female who returns today after I had seen her in consultation in late January. She presented with a 2 cm left UPJ stone, UTI, and intractable pain. She underwent left ureteral stent placement. Her urine culture during her hospitalization was positive for E. coli and Enterococcus. She was initially treated with cephalexin but could not tolerate this and was subsequently transitioned to fluoroquinolone therapy. She completed this course. She denies any fever. She is seen today with her daughter and granddaughter. She has been tolerating her stent quite well.  ? ?Interval: 88 year old female who presents today for preoperative appointment prior to undergoing left-sided ureteroscopy. She currently has a left-sided ureteral stent in place. She is tolerating the stent although does endorse some discolored urine that looks pink or milky at times. Per her daughter over the last few days she has been a little bit more fatigued than normal. She has not had any fevers or chills but does have some frequency.  ? ?  ?ALLERGIES: Azithromycin TABS ?Boniva TABS ?Cefprozil TABS ?Doxycycline Hyclate CAPS ?Erythromycin TABS ?Gentamicin Sulfate SOLN ?Macrodantin CAPS ?Penicillins ?PredniSONE TABS ?Sulfa Drugs ?Travatan SOLN ?Xalatan SOLN ?  ? ?MEDICATIONS: Albuterol 90 MCG/ACT AERS Inhalation  ?Alphagan P 0.15 % drops Ophthalmic  ?Aspirin 81 MG TABS Oral  ?Atrovent  HFA 17 MCG/ACT Inhalation Aerosol Solution Inhalation  ?Betimol 0.5 % drops Ophthalmic  ?Calcium 600 mg calcium (1,500 mg) tablet Oral  ?Cardizem Cd 180 mg capsule, ext release 24 hr 1 Oral Daily  ?Centrum Silver tablet Oral  ?Ciprofloxacin HCl - 500 MG Oral Tablet Oral  ?Clarinex 5 MG Oral Tablet Oral  ?Epipen 0.3 mg/0.3 ml auto-injector 0 Injection  ?Lasix 20 mg tablet 1 Oral Daily  ?Prednisolone Acetate 1 % suspension, drops Ophthalmic  ?Preservision Areds 14,320 unit-226 mg-200 unit-34.8 mg-0.8 mg capsule Oral  ?Refresh P.M. Ophthalmic Ointment Ophthalmic  ?Soothe Xp 1 %-4.5 % drops Ophthalmic  ?Timolol Maleate 0.5 % drops Ophthalmic  ?  ? ?GU PSH: Cysto Uretero Lithotripsy - 2009 ?Cystoscopy Insert Stent - 2009 ? ?  ?   ?PSH Notes: Cystoscopy With Ureteroscopy With Lithotripsy, Cystoscopy With Insertion Of Ureteral Stent Left, Breast Surgery Lumpectomy  ? ?NON-GU PSH: Remove Breast Lesion - 2009 ? ?  ? ?GU PMH: Ureteral calculus - 12/19/2021, Calculus of ureter, - 2014 ?Low back pain, Lower back pain - 2014 ?Other microscopic hematuria, Microscopic hematuria - 2014 ?Renal calculus, Nephrolithiasis - 2014 ?Urinary Tract Inf, Unspec site, Urinary tract infection - 2014 ?  ?   ?PMH Notes:  ?1) Urolithiasis: She has a history of calcium oxalate monohydrate urolithiasis and recurrent UTIs. She presented again as a hospital consult in January 2023 at age 32 with a symptomatic 2 cm left UPJ calculus and UTI.  ? ?Jun 2009: Ureteroscopic laser lithotripsy (after failed ESWL)  ?Jan 2023: Left ureteral stent (2 cm left UPJ stone and UTI - E coli and enterococcus)  ? ?NON-GU PMH: Depression ?Glaucoma ?Hypertension ?  ? ?FAMILY HISTORY: Cancer - Runs In  Family ?Diabetes - Runs In Family ?Heart Disease - Runs In Family ?Hematuria - Brother ?Hypertension - Runs In Family ?Prostate Cancer - Brother  ? ?SOCIAL HISTORY: None  ?  Notes: Marital History - Divorced, Tobacco Use  ? ?REVIEW OF SYSTEMS:    ?GU Review Female:   Patient  reports frequent urination and leakage of urine. Patient denies hard to postpone urination, burning /pain with urination, get up at night to urinate, stream starts and stops, trouble starting your stream, have to strain to urinate, and being pregnant.  ?Gastrointestinal (Upper):   Patient denies nausea, vomiting, and indigestion/ heartburn.  ?Gastrointestinal (Lower):   Patient denies diarrhea and constipation.  ?Constitutional:   Patient reports fatigue. Patient denies night sweats, weight loss, and fever.  ?Skin:   Patient denies skin rash/ lesion and itching.  ?Eyes:   Patient denies blurred vision and double vision.  ?Musculoskeletal:   Patient denies back pain and joint pain.  ?Neurological:   Patient denies headaches and dizziness.  ?Psychologic:   Patient denies depression and anxiety.  ? ?VITAL SIGNS:    ?  02/17/2022 01:51 PM  ?BP 115/70 mmHg  ?Pulse 73 /min  ? ?MULTI-SYSTEM PHYSICAL EXAMINATION:    ?Constitutional: Thin. Moderate physical deformities. Normally developed. Good grooming.   ?Cardiovascular: Normal temperature, normal extremity pulses, no swelling, no varicosities.  ?Skin: No paleness, no jaundice, no cyanosis. No lesion, no ulcer, no rash.  ?Neurologic / Psychiatric: Oriented to time, oriented to place, oriented to person. No depression, no anxiety, no agitation.  ?Gastrointestinal: No mass, no tenderness, no rigidity, non obese abdomen.  ? ?  ?Complexity of Data:  ?Source Of History:  Patient  ?Records Review:   Previous Doctor Records, Previous Hospital Records, Previous Patient Records  ?Urine Test Review:   Urinalysis  ? 02/17/22  ?Urinalysis  ?Urine Appearance Turbid   ?Urine Color Manson Passey   ?Urine Glucose Neg mg/dL  ?Urine Bilirubin Neg mg/dL  ?Urine Ketones Neg mg/dL  ?Urine Specific Gravity 1.020   ?Urine Blood 3+ ery/uL  ?Urine pH 5.5   ?Urine Protein 3+ mg/dL  ?Urine Urobilinogen 0.2 mg/dL  ?Urine Nitrites Positive   ?Urine Leukocyte Esterase 3+ leu/uL  ?Urine WBC/hpf Packed/hpf    ?Urine RBC/hpf 20 - 40/hpf   ?Urine Epithelial Cells NS (Not Seen)   ?Urine Bacteria Many (>50/hpf)   ?Urine Mucous Present   ?Urine Yeast NS (Not Seen)   ?Urine Trichomonas Not Present   ?Urine Cystals NS (Not Seen)   ?Urine Casts NS (Not Seen)   ?Urine Sperm Not Present   ? ?PROCEDURES:    ? ?     Urinalysis w/Scope ?Dipstick Dipstick Cont'd Micro  ?Color: Brown Bilirubin: Neg mg/dL WBC/hpf: Packed/hpf  ?Appearance: Turbid Ketones: Neg mg/dL RBC/hpf: 20 - 30/QMV  ?Specific Gravity: 1.020 Blood: 3+ ery/uL Bacteria: Many (>50/hpf)  ?pH: 5.5 Protein: 3+ mg/dL Cystals: NS (Not Seen)  ?Glucose: Neg mg/dL Urobilinogen: 0.2 mg/dL Casts: NS (Not Seen)  ?  Nitrites: Positive Trichomonas: Not Present  ?  Leukocyte Esterase: 3+ leu/uL Mucous: Present  ?    Epithelial Cells: NS (Not Seen)  ?    Yeast: NS (Not Seen)  ?    Sperm: Not Present  ? ? ?ASSESSMENT:  ?    ICD-10 Details  ?1 GU:   Ureteral calculus - N20.1 Left, Acute, Systemic Symptoms  ? ?PLAN:    ? ? ?      Medications ?New Meds: Levofloxacin 250 mg tablet 1 tablet PO Daily   #  7  0 Refill(s)  ?Pharmacy Name:  [facility]  ?Address:  [address]  ? [place], [place] [address]  ?Phone:  [phone]  ?Fax:  [phone]  ?  ? ? ?      Orders ?Labs CULTURE, URINE  ? ? ?      Document ?Letter(s):  Created for Patient: Clinical Summary  ? ? ?     Notes:   Urinalysis does have infectious parameters and it was sent for culture today. Due to symptoms I went ahead and began levofloxacin 250mg  daily. The family had multiple questions regarding upcoming procedure and concerns regarding a staged procedure. I advised the nidus for infection was most likely related to this obstructing stone and that it would be beneficial for the stone to be removed. I advised against removal of stent at this time especially with an active infection. All of their questions were answered to the best of my ability. I discussed with Dr. Laverle PatterBorden as well who agreed to call  the patient once urine culture is resulted and discuss any further questions that her granddaughter Jeanice LimHolly or her daughter may have.  ? ?     Next Appointment:    ?  Next Appointment: 02/26/2022 02:00 PM  ?

## 2022-02-26 ENCOUNTER — Encounter (HOSPITAL_COMMUNITY)
Admission: RE | Disposition: A | Discharge status: Home / Self Care | Payer: Self-pay | Source: Home / Self Care | Attending: Urology

## 2022-02-26 ENCOUNTER — Ambulatory Visit (HOSPITAL_COMMUNITY): Payer: Medicare Other

## 2022-02-26 ENCOUNTER — Other Ambulatory Visit: Payer: Self-pay

## 2022-02-26 ENCOUNTER — Ambulatory Visit (HOSPITAL_BASED_OUTPATIENT_CLINIC_OR_DEPARTMENT_OTHER): Payer: Medicare Other | Admitting: Anesthesiology

## 2022-02-26 ENCOUNTER — Observation Stay (HOSPITAL_COMMUNITY)
Admission: RE | Admit: 2022-02-26 | Discharge: 2022-02-27 | Disposition: A | Discharge status: Home / Self Care | Payer: Medicare Other | Attending: Urology | Admitting: Urology

## 2022-02-26 ENCOUNTER — Encounter (HOSPITAL_COMMUNITY): Payer: Self-pay | Admitting: Urology

## 2022-02-26 ENCOUNTER — Ambulatory Visit (HOSPITAL_COMMUNITY): Payer: Medicare Other | Admitting: Physician Assistant

## 2022-02-26 DIAGNOSIS — N201 Calculus of ureter: Secondary | ICD-10-CM | POA: Diagnosis not present

## 2022-02-26 DIAGNOSIS — Z79899 Other long term (current) drug therapy: Secondary | ICD-10-CM | POA: Insufficient documentation

## 2022-02-26 DIAGNOSIS — Z87891 Personal history of nicotine dependence: Secondary | ICD-10-CM | POA: Diagnosis not present

## 2022-02-26 DIAGNOSIS — Z7982 Long term (current) use of aspirin: Secondary | ICD-10-CM | POA: Insufficient documentation

## 2022-02-26 DIAGNOSIS — K449 Diaphragmatic hernia without obstruction or gangrene: Secondary | ICD-10-CM | POA: Diagnosis not present

## 2022-02-26 DIAGNOSIS — I10 Essential (primary) hypertension: Secondary | ICD-10-CM | POA: Diagnosis not present

## 2022-02-26 DIAGNOSIS — N390 Urinary tract infection, site not specified: Secondary | ICD-10-CM

## 2022-02-26 HISTORY — PX: CYSTOSCOPY/URETEROSCOPY/HOLMIUM LASER/STENT PLACEMENT: SHX6546

## 2022-02-26 SURGERY — CYSTOSCOPY/URETEROSCOPY/HOLMIUM LASER/STENT PLACEMENT
Anesthesia: General | Site: Urethra | Laterality: Left

## 2022-02-26 MED ORDER — CEPHALEXIN 500 MG PO CAPS: 500.0000 mg | ORAL_CAPSULE | Freq: Three times a day (TID) | ORAL | 0 refills | Status: DC

## 2022-02-26 MED ORDER — TRAMADOL HCL 50 MG PO TABS
50.0000 mg | ORAL_TABLET | Freq: Four times a day (QID) | ORAL | Status: DC | PRN
  Administered 2022-02-27: 50 mg via ORAL
  Filled 2022-02-26: qty 1

## 2022-02-26 MED ORDER — PANTOPRAZOLE SODIUM 40 MG PO TBEC: 40.0000 mg | DELAYED_RELEASE_TABLET | Freq: Every day | ORAL | Status: DC

## 2022-02-26 MED ORDER — SODIUM CHLORIDE 0.9 % IR SOLN
Status: DC | PRN
  Administered 2022-02-26: 3000 mL

## 2022-02-26 MED ORDER — ASPIRIN EC 81 MG PO TBEC: 81.0000 mg | DELAYED_RELEASE_TABLET | Freq: Every day | ORAL | Status: DC

## 2022-02-26 MED ORDER — ONDANSETRON HCL 4 MG/2ML IJ SOLN
INTRAMUSCULAR | Status: DC | PRN
Start: 2022-02-26 — End: 2022-02-26
  Administered 2022-02-26: 4 mg via INTRAVENOUS

## 2022-02-26 MED ORDER — LACTATED RINGERS IV SOLN: INTRAVENOUS | Status: DC

## 2022-02-26 MED ORDER — GUAIFENESIN ER 600 MG PO TB12: 600.0000 mg | ORAL_TABLET | Freq: Every day | ORAL | Status: DC | PRN

## 2022-02-26 MED ORDER — SODIUM CHLORIDE 0.9% FLUSH
3.0000 mL | INTRAVENOUS | Status: DC | PRN
Start: 2022-02-26 — End: 2022-02-27

## 2022-02-26 MED ORDER — ACETAMINOPHEN 325 MG PO TABS
650.0000 mg | ORAL_TABLET | ORAL | Status: DC | PRN
  Administered 2022-02-26: 650 mg via ORAL
  Filled 2022-02-26 (×2): qty 2

## 2022-02-26 MED ORDER — FENTANYL CITRATE (PF) 100 MCG/2ML IJ SOLN
INTRAMUSCULAR | Status: DC | PRN
  Administered 2022-02-26 (×2): 12.5 ug via INTRAVENOUS

## 2022-02-26 MED ORDER — DOCUSATE SODIUM 100 MG PO CAPS
100.0000 mg | ORAL_CAPSULE | Freq: Two times a day (BID) | ORAL | Status: DC
  Administered 2022-02-26: 100 mg via ORAL
  Filled 2022-02-26: qty 1

## 2022-02-26 MED ORDER — DIPHENHYDRAMINE HCL 50 MG/ML IJ SOLN: 12.5000 mg | Freq: Four times a day (QID) | INTRAMUSCULAR | Status: DC | PRN

## 2022-02-26 MED ORDER — SODIUM CHLORIDE 0.9% FLUSH: 3.0000 mL | Freq: Two times a day (BID) | INTRAVENOUS | Status: DC

## 2022-02-26 MED ORDER — BRIMONIDINE TARTRATE 0.15 % OP SOLN
1.0000 [drp] | Freq: Two times a day (BID) | OPHTHALMIC | Status: DC
  Administered 2022-02-26: 1 [drp] via OPHTHALMIC
  Filled 2022-02-26: qty 5

## 2022-02-26 MED ORDER — SODIUM CHLORIDE 0.9 % IR SOLN
Status: DC | PRN
  Administered 2022-02-26: 1000 mL

## 2022-02-26 MED ORDER — CEFAZOLIN SODIUM 1 G IM
1.0000 g | Freq: Two times a day (BID) | INTRAMUSCULAR | Status: DC
  Administered 2022-02-26: 1 g via INTRAMUSCULAR
  Filled 2022-02-26 (×2): qty 1000

## 2022-02-26 MED ORDER — CEFAZOLIN SODIUM-DEXTROSE 1-4 GM/50ML-% IV SOLN
1.0000 g | Freq: Two times a day (BID) | INTRAVENOUS | Status: DC
  Filled 2022-02-26: qty 50

## 2022-02-26 MED ORDER — MELATONIN 5 MG PO TABS
5.0000 mg | ORAL_TABLET | Freq: Every day | ORAL | Status: DC
  Administered 2022-02-26: 5 mg via ORAL
  Filled 2022-02-26: qty 1

## 2022-02-26 MED ORDER — SODIUM CHLORIDE 0.9 % IV SOLN: 250.0000 mL | INTRAVENOUS | Status: DC | PRN

## 2022-02-26 MED ORDER — OXYCODONE HCL 5 MG PO TABS: 5.0000 mg | ORAL_TABLET | Freq: Once | ORAL | Status: DC | PRN

## 2022-02-26 MED ORDER — PHENYLEPHRINE 80 MCG/ML (10ML) SYRINGE FOR IV PUSH (FOR BLOOD PRESSURE SUPPORT)
PREFILLED_SYRINGE | INTRAVENOUS | Status: AC
  Filled 2022-02-26: qty 10

## 2022-02-26 MED ORDER — CYANOCOBALAMIN 1000 MCG/ML IJ SOLN: 1000.0000 ug | INTRAMUSCULAR | Status: DC

## 2022-02-26 MED ORDER — ONDANSETRON HCL 4 MG/2ML IJ SOLN: 4.0000 mg | Freq: Once | INTRAMUSCULAR | Status: DC | PRN

## 2022-02-26 MED ORDER — TRAMADOL HCL 50 MG PO TABS: 50.0000 mg | ORAL_TABLET | Freq: Four times a day (QID) | ORAL | Status: DC | PRN

## 2022-02-26 MED ORDER — PROSIGHT PO TABS
1.0000 | ORAL_TABLET | Freq: Two times a day (BID) | ORAL | Status: DC
  Administered 2022-02-26: 1 via ORAL
  Filled 2022-02-26: qty 1

## 2022-02-26 MED ORDER — FENTANYL CITRATE (PF) 100 MCG/2ML IJ SOLN
INTRAMUSCULAR | Status: AC
  Filled 2022-02-26: qty 2

## 2022-02-26 MED ORDER — DIPHENHYDRAMINE HCL 12.5 MG/5ML PO ELIX: 12.5000 mg | ORAL_SOLUTION | Freq: Four times a day (QID) | ORAL | Status: DC | PRN

## 2022-02-26 MED ORDER — ZOLPIDEM TARTRATE 5 MG PO TABS: 5.0000 mg | ORAL_TABLET | Freq: Every evening | ORAL | Status: DC | PRN

## 2022-02-26 MED ORDER — PHENYLEPHRINE HCL (PRESSORS) 10 MG/ML IV SOLN
INTRAVENOUS | Status: DC | PRN
  Administered 2022-02-26: 40 ug via INTRAVENOUS
  Administered 2022-02-26 (×2): 80 ug via INTRAVENOUS
  Administered 2022-02-26 (×2): 40 ug via INTRAVENOUS

## 2022-02-26 MED ORDER — LIDOCAINE HCL (CARDIAC) PF 100 MG/5ML IV SOSY
PREFILLED_SYRINGE | INTRAVENOUS | Status: DC | PRN
  Administered 2022-02-26: 30 mg via INTRAVENOUS

## 2022-02-26 MED ORDER — CEFAZOLIN SODIUM-DEXTROSE 2-4 GM/100ML-% IV SOLN
2.0000 g | Freq: Once | INTRAVENOUS | Status: AC
  Administered 2022-02-26: 2 g via INTRAVENOUS
  Filled 2022-02-26: qty 100

## 2022-02-26 MED ORDER — FENTANYL CITRATE PF 50 MCG/ML IJ SOSY: 25.0000 ug | PREFILLED_SYRINGE | INTRAMUSCULAR | Status: DC | PRN

## 2022-02-26 MED ORDER — ONDANSETRON HCL 4 MG/2ML IJ SOLN: 4.0000 mg | INTRAMUSCULAR | Status: DC | PRN

## 2022-02-26 MED ORDER — TOBRAMYCIN-DEXAMETHASONE 0.3-0.1 % OP OINT
1.0000 "application " | TOPICAL_OINTMENT | Freq: Two times a day (BID) | OPHTHALMIC | Status: DC
  Administered 2022-02-26: 1 via OPHTHALMIC
  Filled 2022-02-26: qty 3.5

## 2022-02-26 MED ORDER — DILTIAZEM HCL ER COATED BEADS 180 MG PO CP24: 180.0000 mg | ORAL_CAPSULE | Freq: Every day | ORAL | Status: DC

## 2022-02-26 MED ORDER — POLYVINYL ALCOHOL 1.4 % OP SOLN
1.0000 [drp] | Freq: Four times a day (QID) | OPHTHALMIC | Status: DC | PRN
  Filled 2022-02-26: qty 15

## 2022-02-26 MED ORDER — ORAL CARE MOUTH RINSE: 15.0000 mL | Freq: Once | OROMUCOSAL | Status: AC

## 2022-02-26 MED ORDER — DORZOLAMIDE HCL-TIMOLOL MAL 2-0.5 % OP SOLN
1.0000 [drp] | Freq: Two times a day (BID) | OPHTHALMIC | Status: DC
  Administered 2022-02-26: 1 [drp] via OPHTHALMIC
  Filled 2022-02-26: qty 10

## 2022-02-26 MED ORDER — PROPOFOL 10 MG/ML IV BOLUS
INTRAVENOUS | Status: AC
  Filled 2022-02-26: qty 20

## 2022-02-26 MED ORDER — CHLORHEXIDINE GLUCONATE 0.12 % MT SOLN
15.0000 mL | Freq: Once | OROMUCOSAL | Status: AC
  Administered 2022-02-26: 15 mL via OROMUCOSAL

## 2022-02-26 MED ORDER — OXYCODONE HCL 5 MG/5ML PO SOLN: 5.0000 mg | Freq: Once | ORAL | Status: DC | PRN

## 2022-02-26 MED ORDER — ALBUTEROL SULFATE HFA 108 (90 BASE) MCG/ACT IN AERS
2.0000 | INHALATION_SPRAY | Freq: Four times a day (QID) | RESPIRATORY_TRACT | Status: DC | PRN
  Filled 2022-02-26: qty 6.7

## 2022-02-26 MED ORDER — PROPOFOL 10 MG/ML IV BOLUS
INTRAVENOUS | Status: DC | PRN
Start: 2022-02-26 — End: 2022-02-26
  Administered 2022-02-26: 100 mg via INTRAVENOUS

## 2022-02-26 MED ORDER — ACETAMINOPHEN 325 MG PO TABS
650.0000 mg | ORAL_TABLET | Freq: Every day | ORAL | Status: DC
Start: 2022-02-26 — End: 2022-02-27
  Administered 2022-02-26: 650 mg via ORAL
  Filled 2022-02-26: qty 2

## 2022-02-26 SURGICAL SUPPLY — 25 items
BAG COUNTER SPONGE SURGICOUNT (BAG) IMPLANT
BAG SPNG CNTER NS LX DISP (BAG)
BAG URO CATCHER STRL LF (MISCELLANEOUS) ×3 IMPLANT
BASKET ZERO TIP NITINOL 2.4FR (BASKET) ×1 IMPLANT
BSKT STON RTRVL ZERO TP 2.4FR (BASKET) ×1
CATH URETL OPEN END 6FR 70 (CATHETERS) IMPLANT
CLOTH BEACON ORANGE TIMEOUT ST (SAFETY) ×3 IMPLANT
GLOVE SURG LX 7.5 STRW (GLOVE) ×2
GLOVE SURG LX STRL 7.5 STRW (GLOVE) ×2 IMPLANT
GOWN STRL REUS W/TWL XL LVL3 (GOWN DISPOSABLE) ×2 IMPLANT
GUIDEWIRE STR DUAL SENSOR (WIRE) ×3 IMPLANT
GUIDEWIRE ZIPWRE .038 STRAIGHT (WIRE) IMPLANT
IV NS 1000ML (IV SOLUTION) ×2
IV NS 1000ML BAXH (IV SOLUTION) ×2 IMPLANT
KIT TURNOVER KIT A (KITS) IMPLANT
LASER FIB FLEXIVA PULSE ID 365 (Laser) IMPLANT
MANIFOLD NEPTUNE II (INSTRUMENTS) ×3 IMPLANT
PACK CYSTO (CUSTOM PROCEDURE TRAY) ×3 IMPLANT
SHEATH NAVIGATOR HD 11/13X28 (SHEATH) ×1 IMPLANT
SHEATH NAVIGATOR HD 11/13X36 (SHEATH) IMPLANT
STENT URET 6FRX24 CONTOUR (STENTS) ×1 IMPLANT
TRACTIP FLEXIVA PULS ID 200XHI (Laser) IMPLANT
TRACTIP FLEXIVA PULSE ID 200 (Laser) ×2
TUBING CONNECTING 10 (TUBING) ×3 IMPLANT
TUBING UROLOGY SET (TUBING) ×3 IMPLANT

## 2022-02-26 NOTE — Anesthesia Postprocedure Evaluation (Signed)
Anesthesia Post Note ? ?Patient: [name] ? ?Procedure(s) Performed: CYSTOSCOPY/URETEROSCOPY/HOLMIUM LASER/STENT PLACEMENT (Left: Urethra) ? ?  ? ?Patient location during evaluation: PACU ?Anesthesia Type: General ?Level of consciousness: awake and alert ?Pain management: pain level controlled ?Vital Signs Assessment: post-procedure vital signs reviewed and stable ?Respiratory status: spontaneous breathing, nonlabored ventilation, respiratory function stable and patient connected to nasal cannula oxygen ?Cardiovascular status: blood pressure returned to baseline and stable ?Postop Assessment: no apparent nausea or vomiting ?Anesthetic complications: no ? ? ?No notable events documented. ? ?Last Vitals:  ?Vitals:  ? 02/26/22 1548 02/26/22 1658  ?BP: 123/63 123/63  ?Pulse: 64 64  ?Resp: 20 20  ?Temp: 36.4 ?C 36.4 ?C  ?SpO2: 99%   ?  ?Last Pain:  ?Vitals:  ? 02/26/22 1658  ?TempSrc: Oral  ?PainSc:   ? ? ?  ?  ?  ?  ?  ?  ? ?Iwao Shamblin S ? ? ? ? ?

## 2022-02-26 NOTE — Progress Notes (Signed)
Pt. Declined for PIV , pt.  A/O x 3, with pt.'s daughter and RN at bedside. ?

## 2022-02-26 NOTE — Progress Notes (Signed)
Pt with incont episode of urine in t bed and Pt assisted up to [facility] and voided ?

## 2022-02-26 NOTE — Anesthesia Preprocedure Evaluation (Signed)
Anesthesia Evaluation  ?Patient identified by MRN, date of birth, ID band ?Patient awake ? ? ? ?Reviewed: ?Allergy & Precautions, NPO status , Patient's Chart, lab work & pertinent test results ? ?Airway ?Mallampati: II ? ?TM Distance: >3 FB ?Neck ROM: Limited ? ? ? Dental ?no notable dental hx. ? ?  ?Pulmonary ?neg pulmonary ROS, former smoker,  ?  ?Pulmonary exam normal ?breath sounds clear to auscultation ? ? ? ? ? ? Cardiovascular ?hypertension, Pt. on medications ?Normal cardiovascular exam ?Rhythm:Regular Rate:Normal ? ? ?  ?Neuro/Psych ?negative neurological ROS ? negative psych ROS  ? GI/Hepatic ?Neg liver ROS, hiatal hernia, GERD  Medicated,  ?Endo/Other  ?negative endocrine ROS ? Renal/GU ?negative Renal ROS  ?negative genitourinary ?  ?Musculoskeletal ?negative musculoskeletal ROS ?(+)  ? Abdominal ?  ?Peds ?negative pediatric ROS ?(+)  Hematology ?negative hematology ROS ?(+)   ?Anesthesia Other Findings ? ? Reproductive/Obstetrics ?negative OB ROS ? ?  ? ? ? ? ? ? ? ? ? ? ? ? ? ?  ?  ? ? ? ? ? ? ? ? ?Anesthesia Physical ?Anesthesia Plan ? ?ASA: 3 ? ?Anesthesia Plan: General  ? ?Post-op Pain Management: Minimal or no pain anticipated  ? ?Induction: Intravenous ? ?PONV Risk Score and Plan: 3 and Ondansetron, Dexamethasone and Treatment may vary due to age or medical condition ? ?Airway Management Planned: LMA ? ?Additional Equipment:  ? ?Intra-op Plan:  ? ?Post-operative Plan: Extubation in OR ? ?Informed Consent: I have reviewed the patients History and Physical, chart, labs and discussed the procedure including the risks, benefits and alternatives for the proposed anesthesia with the patient or authorized representative who has indicated his/her understanding and acceptance.  ? ? ? ?Dental advisory given ? ?Plan Discussed with: CRNA and Surgeon ? ?Anesthesia Plan Comments:   ? ? ? ? ? ? ?Anesthesia Quick Evaluation ? ?

## 2022-02-26 NOTE — Interval H&P Note (Signed)
History and Physical Interval Note: ? ?02/26/2022 ?1:12 PM ? ?[name]  has presented today for surgery, with the diagnosis of LEFT URETEROPELVIC JUNCTION CALCULUS.  The various methods of treatment have been discussed with the patient and family. After consideration of risks, benefits and other options for treatment, the patient has consented to  Procedure(s): ?CYSTOSCOPY/URETEROSCOPY/HOLMIUM LASER/STENT PLACEMENT (Left) as a surgical intervention.  The patient's history has been reviewed, patient examined, no change in status, stable for surgery.  I have reviewed the patient's chart and labs.  Questions were answered to the patient's satisfaction.   ? ? ?[name] ? ? ?

## 2022-02-26 NOTE — Discharge Instructions (Signed)

## 2022-02-26 NOTE — Anesthesia Procedure Notes (Signed)
Procedure Name: LMA Insertion ?Date/Time: 02/26/2022 1:49 PM ?Performed by: Theodosia Quay, CRNA ?Pre-anesthesia Checklist: Patient identified, Emergency Drugs available, Suction available and Patient being monitored ?Patient Re-evaluated:Patient Re-evaluated prior to induction ?Oxygen Delivery Method: Circle System Utilized ?Preoxygenation: Pre-oxygenation with 100% oxygen ?Induction Type: IV induction ?Ventilation: Mask ventilation without difficulty ?LMA: LMA with gastric port inserted ?LMA Size: 3.0 ?Number of attempts: 1 ?Airway Equipment and Method: Bite block ?Placement Confirmation: positive ETCO2 ?Tube secured with: Tape ?Dental Injury: Teeth and Oropharynx as per pre-operative assessment  ? ? ? ? ?

## 2022-02-26 NOTE — Op Note (Signed)
Preoperative diagnosis: Left UPJ calculus ? ?Postoperative diagnosis: Left UPJ calculus ? ?Procedure: ? ?Cystoscopy ?Left ureteroscopy and stone removal ?Ureteroscopic laser lithotripsy ?Left ureteral stent placement (6 x 24 with no string) ? ?Surgeon: Rolly Salter, Montez Hageman. M.D. ? ?Anesthesia: General ? ?Complications: None ? ?Intraoperative findings: Left retrograde pyelography demonstrated a filling defect within the left UPJ consistent with the patient?s known calculus without other abnormalities noted. ? ?EBL: Minimal ? ?Specimens: ?Left UPJ calculus ? ?Disposition of specimens: [facility] for stone analysis ? ?Indication: [name]  is a 88 y.o. patient with an obstructing left UPJ calculus s/p ureteral stent. After reviewing the management options for treatment, they elected to proceed with the above surgical procedure(s). We have discussed the potential benefits and risks of the procedure, side effects of the proposed treatment, the likelihood of the patient achieving the goals of the procedure, and any potential problems that might occur during the procedure or recuperation. Informed consent has been obtained. ? ?Description of procedure: ? ?The patient was taken to the operating room and general anesthesia was induced.  The patient was placed in the dorsal lithotomy position, prepped and draped in the usual sterile fashion, and preoperative antibiotics were administered. A preoperative time-out was performed.  ? ?Cystourethroscopy was performed.  The patient?s urethra was examined and was normal. The bladder was then systematically examined in its entirety. There was no evidence for any bladder tumors, stones, or other mucosal pathology.   ? ?Attention then turned to the left ureteral orifice and a ureteral catheter was used to intubate the ureteral orifice. ? ?A 0.38 sensor guidewire was then advanced up the left ureter into the renal pelvis under fluoroscopic guidance.  A 12/14 Fr  ureteral access sheath was then advanced over the guide wire. The digital flexible ureteroscope was then advanced through the access sheath into the ureter next to the guidewire and the calculus was identified and was located in the left renal pelvis. ? ? The stone was then fragmented with the 200 micron holmium laser fiber on a setting of 0.2 J and frequency of 70 Hz.  ? ?All sizable stones were then removed with a zero tip nitinol basket.  Reinspection of the ureter/renal pelvis revealed no remaining visible stones or fragments of significant size.  ? ?The safety wire was then replaced and the access sheath removed.  The guidewire was backloaded through the cystoscope and a ureteral stent was advance over the wire using Seldinger technique.  The stent was positioned appropriately under fluoroscopic and cystoscopic guidance.  The wire was then removed with an adequate stent curl noted in the renal pelvis as well as in the bladder. ? ?The bladder was then emptied and the procedure ended.  The patient appeared to tolerate the procedure well and without complications.  The patient was able to be awakened and transferred to the recovery unit in satisfactory condition.  ? ?Moody Bruins MD ? ?

## 2022-02-26 NOTE — Transfer of Care (Signed)
Immediate Anesthesia Transfer of Care Note ? ?Patient: [name] ? ?Procedure(s) Performed: Procedure(s): ?CYSTOSCOPY/URETEROSCOPY/HOLMIUM LASER/STENT PLACEMENT (Left) ? ?Patient Location: PACU ? ?Anesthesia Type:General ? ?Level of Consciousness:  sedated, patient cooperative and responds to stimulation ? ?Airway & Oxygen Therapy:Patient Spontanous Breathing and Patient connected to face mask oxgen ? ?Post-op Assessment:  Report given to PACU RN and Post -op Vital signs reviewed and stable ? ?Post vital signs:  Reviewed and stable ? ?Last Vitals: There were no vitals filed for this visit. ? ?Complications: No apparent anesthesia complications ? ?

## 2022-02-27 ENCOUNTER — Encounter (HOSPITAL_COMMUNITY): Payer: Self-pay | Admitting: Urology

## 2022-02-27 DIAGNOSIS — I10 Essential (primary) hypertension: Secondary | ICD-10-CM | POA: Diagnosis not present

## 2022-02-27 DIAGNOSIS — Z7982 Long term (current) use of aspirin: Secondary | ICD-10-CM | POA: Diagnosis not present

## 2022-02-27 DIAGNOSIS — N201 Calculus of ureter: Secondary | ICD-10-CM | POA: Diagnosis not present

## 2022-02-27 DIAGNOSIS — Z79899 Other long term (current) drug therapy: Secondary | ICD-10-CM | POA: Diagnosis not present

## 2022-02-27 NOTE — Progress Notes (Signed)
Patient ID: [name], female   DOB: May 22, 1926, 88 y.o.   MRN: [id number] ? ?1 Day Post-Op ?Subjective: ?Doing well.  Denies pain this morning.  No issues overnight. ? ?Objective: ?Vital signs in last 24 hours: ?Temp:  [97.4 ?F (36.3 ?C)-98.1 ?F (36.7 ?C)] 97.9 ?F (36.6 ?C) (04/28 0407) ?Pulse Rate:  [49-99] 63 (04/28 0407) ?Resp:  [15-20] 19 (04/28 0407) ?BP: (93-123)/(51-76) 112/60 (04/28 0407) ?SpO2:  [92 %-100 %] 94 % (04/28 0407) ?Weight:  [44 kg-44.2 kg] 44.2 kg (04/28 0407) ? ?Intake/Output from previous day: ?04/27 0701 - 04/28 0700 ?In: 1000 [I.V.:1000] ?Out: 500 [Urine:500] ?Intake/Output this shift: ?No intake/output data recorded. ? ?Physical Exam:  ?General: Alert and oriented ?Abd: No CVAT, no abdominal pain ? ?Lab Results: ? ? ?Studies/Results: ? ?Assessment/Plan: ?1) Left UPJ calculus s/p ureteroscopic laser lithotripsy last night:  Discharge home today. ? ? LOS: 0 days  ? ?[name] ?02/27/2022, 7:32 AM ? ?  ?

## 2022-02-27 NOTE — Progress Notes (Signed)
Called Dr. Laverle Patter to verify antibiotics w/ him & family. Dr. Laverle Patter on speaker phone & reassured family. All questions & concerns addressed. No IV to remove for discharge. AVS reviewed including appointments/meds. NO am meds given but offered by this RN-grandaughter wants to give all am meds at home & be discharged now. ?

## 2022-02-27 NOTE — H&P (View-Only) (Signed)
Patient ID: [name], female   DOB: 04/11/1926, 88 y.o.   MRN: [id number] ? ?1 Day Post-Op ?Subjective: ?Doing well.  Denies pain this morning.  No issues overnight. ? ?Objective: ?Vital signs in last 24 hours: ?Temp:  [97.4 ?F (36.3 ?C)-98.1 ?F (36.7 ?C)] 97.9 ?F (36.6 ?C) (04/28 0407) ?Pulse Rate:  [49-99] 63 (04/28 0407) ?Resp:  [15-20] 19 (04/28 0407) ?BP: (93-123)/(51-76) 112/60 (04/28 0407) ?SpO2:  [92 %-100 %] 94 % (04/28 0407) ?Weight:  [44 kg-44.2 kg] 44.2 kg (04/28 0407) ? ?Intake/Output from previous day: ?04/27 0701 - 04/28 0700 ?In: 1000 [I.V.:1000] ?Out: 500 [Urine:500] ?Intake/Output this shift: ?No intake/output data recorded. ? ?Physical Exam:  ?General: Alert and oriented ?Abd: No CVAT, no abdominal pain ? ?Lab Results: ? ? ?Studies/Results: ? ?Assessment/Plan: ?1) Left UPJ calculus s/p ureteroscopic laser lithotripsy last night:  Discharge home today. ? ? LOS: 0 days  ? ?Les Abbas Beyene ?02/27/2022, 7:32 AM ? ?  ?

## 2022-02-27 NOTE — Discharge Summary (Signed)
?  Date of admission: 02/26/2022 ? ?Date of discharge: 02/27/2022 ? ?Admission diagnosis: Left UPJ calculus ? ?Discharge diagnosis: Left UPJ calculus ? ?Secondary diagnoses: Hypertension ? ?History and Physical: For full details, please see admission history and physical. Briefly, [name] is a 88 y.o. year old patient with a large 2 cm left UPJ stone s/p ureteral stent placement in January.  She presents today for definitive treatment.  ? ?Hospital Course: She underwent left ureteroscopic laser lithotripsy yesterday and replacement of her ureteral stent. She tolerated this well and was observed overnight due to her advanced age.  ? ?Laboratory values: No results for input(s): HGB, HCT in the last 72 hours. ?No results for input(s): CREATININE in the last 72 hours. ? ?Disposition: Home ? ?Discharge instruction: The patient was instructed to resume normal diet and activity. ? ?Discharge medications:  ?Allergies as of 02/27/2022   ? ?   Reactions  ? Iohexol Itching, Swelling, Rash  ? pt allergic to prednisone she needs IV premeds  ? Strawberry (diagnostic)   ? Sulfa Antibiotics   ? Travatan [travoprost] Itching  ? Xalatan [latanoprost] Itching  ? Azithromycin Rash  ? Doxycycline Rash  ? Gentamicin Rash  ? Ibandronic Acid Rash  ? Nitrofurantoin Rash  ? Penicillins Rash  ? Prednisone Rash  ? ?  ? ?  ?Medication List  ?  ? ?TAKE these medications   ? ?acetaminophen 325 MG tablet ?Commonly known as: TYLENOL ?Take 650 mg by mouth at bedtime. ?  ?albuterol 108 (90 Base) MCG/ACT inhaler ?Commonly known as: VENTOLIN HFA ?Inhale 2 puffs into the lungs every 6 (six) hours. ?  ?Alphagan P 0.1 % Soln ?Generic drug: brimonidine ?Place 1 drop into both eyes 2 (two) times daily. ?  ?aspirin EC 81 MG tablet ?Take 81 mg by mouth daily. Swallow whole. ?  ?cephALEXin 500 MG capsule ?Commonly known as: Keflex ?Take 1 capsule (500 mg total) by mouth 3 (three) times daily. ?  ?cyanocobalamin 1000 MCG/ML injection ?Commonly known as:  (VITAMIN B-12) ?Inject 1,000 mcg into the muscle every 30 (thirty) days. ?  ?diltiazem 180 MG 24 hr capsule ?Commonly known as: CARDIZEM CD ?Take 180 mg by mouth daily. ?  ?dorzolamide-timolol 22.3-6.8 MG/ML ophthalmic solution ?Commonly known as: COSOPT ?Place 1 drop into both eyes 2 (two) times daily. ?  ?guaiFENesin 600 MG 12 hr tablet ?Commonly known as: MUCINEX ?Take 600 mg by mouth daily as needed (congestion). ?  ?melatonin 5 MG Tabs ?Take 5 mg by mouth at bedtime. ?  ?omeprazole 20 MG capsule ?Commonly known as: PRILOSEC ?Take 20 mg by mouth daily. ?  ?PRESERVISION/LUTEIN PO ?Take 1 tablet by mouth 2 (two) times daily. ?  ?Systane 0.4-0.3 % Soln ?Generic drug: Polyethyl Glycol-Propyl Glycol ?Place 1 drop into both eyes every 6 (six) hours as needed (dry eyes). ?  ?TobraDex ophthalmic ointment ?Generic drug: tobramycin-dexamethasone ?Place 1 application. into both eyes 2 (two) times daily. ?  ?VIACTIV PO ?Take 2 tablets by mouth daily. ?  ? ?  ? ? ?Followup:  ? Follow-up Information   ? ? Heloise Purpura, MD Follow up.   ?Specialty: Urology ?Why: 03/10/22 at 9:00 AM for stent removal ?and  ?04/14/21 at 10:15 AM ?Contact information: ?[address] ?[place] [address] ?[phone] ? ? ?  ?  ? ?  ?  ? ?  ? ? ?

## 2022-03-01 ENCOUNTER — Emergency Department (HOSPITAL_COMMUNITY): Payer: Medicare Other

## 2022-03-01 ENCOUNTER — Encounter (HOSPITAL_COMMUNITY): Payer: Self-pay

## 2022-03-01 ENCOUNTER — Other Ambulatory Visit: Payer: Self-pay

## 2022-03-01 ENCOUNTER — Emergency Department (HOSPITAL_COMMUNITY)
Admission: EM | Admit: 2022-03-01 | Discharge: 2022-03-01 | Disposition: A | Discharge status: Home / Self Care | Payer: Medicare Other | Attending: Emergency Medicine | Admitting: Emergency Medicine

## 2022-03-01 DIAGNOSIS — Z87442 Personal history of urinary calculi: Secondary | ICD-10-CM | POA: Insufficient documentation

## 2022-03-01 DIAGNOSIS — M549 Dorsalgia, unspecified: Secondary | ICD-10-CM | POA: Insufficient documentation

## 2022-03-01 DIAGNOSIS — D649 Anemia, unspecified: Secondary | ICD-10-CM | POA: Diagnosis not present

## 2022-03-01 DIAGNOSIS — Z743 Need for continuous supervision: Secondary | ICD-10-CM | POA: Diagnosis not present

## 2022-03-01 DIAGNOSIS — R109 Unspecified abdominal pain: Secondary | ICD-10-CM | POA: Diagnosis not present

## 2022-03-01 DIAGNOSIS — R6889 Other general symptoms and signs: Secondary | ICD-10-CM | POA: Diagnosis not present

## 2022-03-01 DIAGNOSIS — K4090 Unilateral inguinal hernia, without obstruction or gangrene, not specified as recurrent: Secondary | ICD-10-CM | POA: Diagnosis not present

## 2022-03-01 DIAGNOSIS — Z7982 Long term (current) use of aspirin: Secondary | ICD-10-CM | POA: Diagnosis not present

## 2022-03-01 DIAGNOSIS — R52 Pain, unspecified: Secondary | ICD-10-CM | POA: Diagnosis not present

## 2022-03-01 DIAGNOSIS — Z466 Encounter for fitting and adjustment of urinary device: Secondary | ICD-10-CM | POA: Diagnosis not present

## 2022-03-01 DIAGNOSIS — R0902 Hypoxemia: Secondary | ICD-10-CM | POA: Diagnosis not present

## 2022-03-01 DIAGNOSIS — N281 Cyst of kidney, acquired: Secondary | ICD-10-CM | POA: Diagnosis not present

## 2022-03-01 DIAGNOSIS — N200 Calculus of kidney: Secondary | ICD-10-CM | POA: Diagnosis not present

## 2022-03-01 LAB — CBC WITH DIFFERENTIAL/PLATELET
Abs Immature Granulocytes: 0.04 10*3/uL (ref 0.00–0.07)
Basophils Absolute: 0.1 10*3/uL (ref 0.0–0.1)
Basophils Relative: 1 %
Eosinophils Absolute: 0.1 10*3/uL (ref 0.0–0.5)
Eosinophils Relative: 1 %
HCT: 34.6 % — ABNORMAL LOW (ref 36.0–46.0)
Hemoglobin: 10.6 g/dL — ABNORMAL LOW (ref 12.0–15.0)
Immature Granulocytes: 0 %
Lymphocytes Relative: 16 %
Lymphs Abs: 1.6 10*3/uL (ref 0.7–4.0)
MCH: 24.5 pg — ABNORMAL LOW (ref 26.0–34.0)
MCHC: 30.6 g/dL (ref 30.0–36.0)
MCV: 79.9 fL — ABNORMAL LOW (ref 80.0–100.0)
Monocytes Absolute: 1.4 10*3/uL — ABNORMAL HIGH (ref 0.1–1.0)
Monocytes Relative: 14 %
Neutro Abs: 6.9 10*3/uL (ref 1.7–7.7)
Neutrophils Relative %: 68 %
Platelets: 536 10*3/uL — ABNORMAL HIGH (ref 150–400)
RBC: 4.33 MIL/uL (ref 3.87–5.11)
RDW: 19.7 % — ABNORMAL HIGH (ref 11.5–15.5)
WBC: 10.1 10*3/uL (ref 4.0–10.5)
nRBC: 0 % (ref 0.0–0.2)

## 2022-03-01 LAB — BASIC METABOLIC PANEL
Anion gap: 6 (ref 5–15)
BUN: 20 mg/dL (ref 8–23)
CO2: 23 mmol/L (ref 22–32)
Calcium: 8.5 mg/dL — ABNORMAL LOW (ref 8.9–10.3)
Chloride: 112 mmol/L — ABNORMAL HIGH (ref 98–111)
Creatinine, Ser: 0.79 mg/dL (ref 0.44–1.00)
GFR, Estimated: >60 mL/min (ref >60)
Glucose, Bld: 93 mg/dL (ref 70–99)
Potassium: 3.8 mmol/L (ref 3.5–5.1)
Sodium: 141 mmol/L (ref 135–145)

## 2022-03-01 LAB — URINALYSIS, ROUTINE W REFLEX MICROSCOPIC
Bilirubin Urine: NEGATIVE
Glucose, UA: NEGATIVE mg/dL
Ketones, ur: NEGATIVE mg/dL
Nitrite: NEGATIVE
Protein, ur: 100 mg/dL — AB
Specific Gravity, Urine: 1.015 (ref 1.005–1.030)
WBC, UA: >50 WBC/hpf — ABNORMAL HIGH (ref 0–5)
pH: 6 (ref 5.0–8.0)

## 2022-03-01 MED ORDER — OXYCODONE-ACETAMINOPHEN 5-325 MG PO TABS: ORAL_TABLET | ORAL | 0 refills | Status: DC

## 2022-03-01 MED ORDER — DICLOFENAC SODIUM 1 % EX GEL
CUTANEOUS | 1 refills | Status: DC
Start: 2022-03-01 — End: 2022-03-15

## 2022-03-01 MED ORDER — CIPROFLOXACIN HCL 500 MG PO TABS: 500.0000 mg | ORAL_TABLET | Freq: Two times a day (BID) | ORAL | 0 refills | Status: DC

## 2022-03-01 MED ORDER — KETOROLAC TROMETHAMINE 30 MG/ML IJ SOLN
15.0000 mg | Freq: Once | INTRAMUSCULAR | Status: AC
  Administered 2022-03-01: 15 mg via INTRAVENOUS
  Filled 2022-03-01: qty 1

## 2022-03-01 MED ORDER — OXYCODONE-ACETAMINOPHEN 5-325 MG PO TABS
1.0000 | ORAL_TABLET | Freq: Once | ORAL | Status: AC
  Administered 2022-03-01: 1 via ORAL
  Filled 2022-03-01: qty 1

## 2022-03-01 NOTE — ED Provider Notes (Signed)
?[facility]PART[facility] ?Provider Note ? ? ?CSN: [id number] ?Arrival date & time: 03/01/22  4917 ? ?  ? ?History ? ?Chief Complaint  ?Patient presents with  ? Back Pain  ? ? ?[name] is a 88 y.o. female. ? ?Patient with a history of kidney stones.  She has a stent on the left side.  She is complaining of right-sided flank pain. ? ?The history is provided by the patient and medical records. No language interpreter was used.  ?Back Pain ?Pain location: Flank pain. ?Quality:  Aching ?Radiates to:  Does not radiate ?Pain severity:  Moderate ?Onset quality:  Gradual ?Timing:  Constant ?Progression:  Waxing and waning ?Chronicity:  Recurrent ?Context: not emotional stress   ?Worsened by:  Nothing ?Ineffective treatments:  None tried ?Associated symptoms: no abdominal pain, no chest pain and no headaches   ? ?  ? ?Home Medications ?Prior to Admission medications   ?Medication Sig Start Date End Date Taking? Authorizing Provider  ?diclofenac Sodium (VOLTAREN) 1 % GEL He is 3 times a day as needed for pain 03/01/22  Yes Bethann Berkshire, MD  ?oxyCODONE-acetaminophen (PERCOCET) 5-325 MG tablet Use 1 every 6 hours as needed for severe pain 03/01/22  Yes Bethann Berkshire, MD  ?acetaminophen (TYLENOL) 325 MG tablet Take 650 mg by mouth at bedtime.    [provider]  ?albuterol (VENTOLIN HFA) 108 (90 Base) MCG/ACT inhaler Inhale 2 puffs into the lungs every 6 (six) hours. ?Patient not taking: Reported on 02/12/2022 08/19/20   [place], Eric J, DO  ?ALPHAGAN P 0.1 % SOLN Place 1 drop into both eyes 2 (two) times daily. 08/05/20   [provider]  ?aspirin EC 81 MG tablet Take 81 mg by mouth daily. Swallow whole.    [provider]  ?Calcium-Vitamin D-Vitamin K (VIACTIV PO) Take 2 tablets by mouth daily.    [provider]  ?cephALEXin (KEFLEX) 500 MG capsule Take 1 capsule (500 mg total) by mouth 3 (three) times daily. 02/26/22   Heloise Purpura, MD  ?cyanocobalamin (,VITAMIN B-12,) 1000  MCG/ML injection Inject 1,000 mcg into the muscle every 30 (thirty) days. 08/05/20   [provider]  ?diltiazem (CARDIZEM CD) 180 MG 24 hr capsule Take 180 mg by mouth daily. 06/25/20   [provider]  ?dorzolamide-timolol (COSOPT) 22.3-6.8 MG/ML ophthalmic solution Place 1 drop into both eyes 2 (two) times daily. 08/05/20   [provider]  ?guaiFENesin (MUCINEX) 600 MG 12 hr tablet Take 600 mg by mouth daily as needed (congestion).    [provider]  ?melatonin 5 MG TABS Take 5 mg by mouth at bedtime.    [provider]  ?Multiple Vitamins-Minerals (PRESERVISION/LUTEIN PO) Take 1 tablet by mouth 2 (two) times daily.    [provider]  ?omeprazole (PRILOSEC) 20 MG capsule Take 20 mg by mouth daily. 05/31/20   [provider]  ?Polyethyl Glycol-Propyl Glycol (SYSTANE) 0.4-0.3 % SOLN Place 1 drop into both eyes every 6 (six) hours as needed (dry eyes).    [provider]  ?TOBRADEX ophthalmic ointment Place 1 application. into both eyes 2 (two) times daily. 10/27/21   [provider]  ?   ? ?Allergies    ?Iohexol, Strawberry (diagnostic), Sulfa antibiotics, Travatan [travoprost], Xalatan [latanoprost], Azithromycin, Doxycycline, Gentamicin, Ibandronic acid, Nitrofurantoin, Penicillins, and Prednisone   ? ?Review of Systems   ?Review of Systems  ?Constitutional:  Negative for appetite change and fatigue.  ?HENT:  Negative for congestion, ear discharge and  sinus pressure.   ?Eyes:  Negative for discharge.  ?Respiratory:  Negative for cough.   ?Cardiovascular:  Negative for chest pain.  ?Gastrointestinal:  Negative for abdominal pain and diarrhea.  ?Genitourinary:  Negative for frequency and hematuria.  ?Musculoskeletal:  Positive for back pain.  ?Skin:  Negative for rash.  ?Neurological:  Negative for seizures and headaches.  ?Psychiatric/Behavioral:  Negative for hallucinations.   ? ?Physical Exam ?Updated Vital Signs ?BP 140/69   Pulse  75   Temp 98 ?F (36.7 ?C) (Oral)   Resp 16   Ht 4\' 6"  (1.372 m)   Wt 44.2 kg   SpO2 99%   BMI 23.49 kg/m?  ?Physical Exam ?Vitals and nursing note reviewed.  ?Constitutional:   ?   Appearance: She is well-developed.  ?HENT:  ?   Head: Normocephalic.  ?   Nose: Nose normal.  ?Eyes:  ?   General: No scleral icterus. ?   Conjunctiva/sclera: Conjunctivae normal.  ?Neck:  ?   Thyroid: No thyromegaly.  ?Cardiovascular:  ?   Rate and Rhythm: Normal rate and regular rhythm.  ?   Heart sounds: No murmur heard. ?  No friction rub. No gallop.  ?Pulmonary:  ?   Breath sounds: No stridor. No wheezing or rales.  ?Chest:  ?   Chest wall: No tenderness.  ?Abdominal:  ?   General: There is no distension.  ?   Tenderness: There is no abdominal tenderness. There is no rebound.  ?Musculoskeletal:  ?   Cervical back: Neck supple.  ?   Comments: Tender right flank  ?Lymphadenopathy:  ?   Cervical: No cervical adenopathy.  ?Skin: ?   Findings: No erythema or rash.  ?Neurological:  ?   Mental Status: She is alert and oriented to person, place, and time.  ?   Motor: No abnormal muscle tone.  ?   Coordination: Coordination normal.  ?Psychiatric:     ?   Behavior: Behavior normal.  ? ? ?ED Results / Procedures / Treatments   ?Labs ?(all labs ordered are listed, but only abnormal results are displayed) ?Labs Reviewed  ?URINALYSIS, ROUTINE W REFLEX MICROSCOPIC - Abnormal; Notable for the following components:  ?    Result Value  ? APPearance HAZY (*)   ? Hgb urine dipstick SMALL (*)   ? Protein, ur 100 (*)   ? Leukocytes,Ua LARGE (*)   ? WBC, UA >50 (*)   ? Bacteria, UA RARE (*)   ? All other components within normal limits  ?CBC WITH DIFFERENTIAL/PLATELET - Abnormal; Notable for the following components:  ? Hemoglobin 10.6 (*)   ? HCT 34.6 (*)   ? MCV 79.9 (*)   ? MCH 24.5 (*)   ? RDW 19.7 (*)   ? Platelets 536 (*)   ? Monocytes Absolute 1.4 (*)   ? All other components within normal limits  ?BASIC METABOLIC PANEL - Abnormal; Notable for  the following components:  ? Chloride 112 (*)   ? Calcium 8.5 (*)   ? All other components within normal limits  ?URINE CULTURE  ? ? ?EKG ?None ? ?Radiology ?CT Renal Stone Study ? ?Result Date: 03/01/2022 ?CLINICAL DATA:  88 year old female presents for evaluation of nephrolithiasis. EXAM: CT ABDOMEN AND PELVIS WITHOUT CONTRAST TECHNIQUE: Multidetector CT imaging of the abdomen and pelvis was performed following the standard protocol without IV contrast. RADIATION DOSE REDUCTION: This exam was performed according to the departmental dose-optimization program which includes automated exposure control, adjustment of the mA  and/or kV according to patient size and/or use of iterative reconstruction technique. COMPARISON:  November 29, 2021. FINDINGS: Lower chest: Bilateral pleural effusions are similar to previous imaging and associated with basilar airspace disease as on prior studies. Calcified coronary artery disease. No pericardial effusion. Heart is incompletely imaged. Hepatobiliary: Liver with smooth contours. No visible lesion on noncontrast imaging. No pericholecystic stranding. No gross biliary duct distension. Pancreas: Mild pancreatic atrophy without signs of inflammation. Normal pancreatic contour. Spleen: Mildly lobulated contour as on previous imaging. Limited assessment due to respiratory motion. Adrenals/Urinary Tract: Mild adrenal thickening bilaterally is stable with benign appearance. Interval insertion of a LEFT-sided nephroureteral stent. Radiodense material with in the dependent collecting systems of the LEFT kidney with stone fragments. The dominant calculus appears to been fragmented. Largest collection of calcific change seen in the renal pelvis approximately 7 x 4 mm with increased density tracking into collecting system elements in the interpolar and lower pole aspect of the LEFT kidney. There is no hydronephrosis but with mild distension of the renal pelvis and also of the ureter. Multiple  calcific fragments are seen along the course of the LEFT nephroureteral stent. Urinary bladder with smooth contours. RIGHT kidney with upper pole cyst that is unchanged and not requiring follow-up. No RIGHT ure

## 2022-03-01 NOTE — ED Notes (Signed)
Right sided lower back pain that started one week ago while at [facility] long after getting lithotripsy for kidney stone on right side. Pt says she told Dr this before being discharged. Pt denies pain at this time, reports having pain with movement, she "thinks it may be a pulled muscle." ?

## 2022-03-01 NOTE — Discharge Instructions (Addendum)
Follow-up with your family doctor or your kidney doctor the end of this week ?

## 2022-03-01 NOTE — ED Triage Notes (Signed)
Pt brought in from home via EMS, co lower right sided back pain x 1 week. Pain started while at Dr last Thursday after lithotripsy to kidney stone on left side. ?

## 2022-03-03 DIAGNOSIS — B370 Candidal stomatitis: Secondary | ICD-10-CM | POA: Diagnosis not present

## 2022-03-03 DIAGNOSIS — N390 Urinary tract infection, site not specified: Secondary | ICD-10-CM | POA: Diagnosis not present

## 2022-03-03 DIAGNOSIS — Z681 Body mass index (BMI) 19 or less, adult: Secondary | ICD-10-CM | POA: Diagnosis not present

## 2022-03-03 LAB — URINE CULTURE: Culture: NO GROWTH

## 2022-03-10 DIAGNOSIS — N201 Calculus of ureter: Secondary | ICD-10-CM | POA: Diagnosis not present

## 2022-03-11 ENCOUNTER — Other Ambulatory Visit: Payer: Self-pay

## 2022-03-11 ENCOUNTER — Emergency Department (HOSPITAL_COMMUNITY): Payer: Medicare Other

## 2022-03-11 ENCOUNTER — Encounter (HOSPITAL_COMMUNITY): Payer: Self-pay

## 2022-03-11 ENCOUNTER — Inpatient Hospital Stay (HOSPITAL_COMMUNITY)
Admission: EM | Admit: 2022-03-11 | Discharge: 2022-03-15 | DRG: 659 | Disposition: A | Discharge status: Skilled Nursing Facility | Payer: Medicare Other | Attending: Internal Medicine | Admitting: Internal Medicine

## 2022-03-11 DIAGNOSIS — K219 Gastro-esophageal reflux disease without esophagitis: Secondary | ICD-10-CM | POA: Diagnosis present

## 2022-03-11 DIAGNOSIS — Z888 Allergy status to other drugs, medicaments and biological substances status: Secondary | ICD-10-CM

## 2022-03-11 DIAGNOSIS — Z7401 Bed confinement status: Secondary | ICD-10-CM | POA: Diagnosis not present

## 2022-03-11 DIAGNOSIS — Z20822 Contact with and (suspected) exposure to covid-19: Secondary | ICD-10-CM | POA: Diagnosis not present

## 2022-03-11 DIAGNOSIS — R319 Hematuria, unspecified: Secondary | ICD-10-CM | POA: Diagnosis not present

## 2022-03-11 DIAGNOSIS — Z91041 Radiographic dye allergy status: Secondary | ICD-10-CM | POA: Diagnosis not present

## 2022-03-11 DIAGNOSIS — N132 Hydronephrosis with renal and ureteral calculous obstruction: Secondary | ICD-10-CM | POA: Diagnosis not present

## 2022-03-11 DIAGNOSIS — H547 Unspecified visual loss: Secondary | ICD-10-CM

## 2022-03-11 DIAGNOSIS — L89101 Pressure ulcer of unspecified part of back, stage 1: Secondary | ICD-10-CM

## 2022-03-11 DIAGNOSIS — Z87442 Personal history of urinary calculi: Secondary | ICD-10-CM

## 2022-03-11 DIAGNOSIS — D649 Anemia, unspecified: Secondary | ICD-10-CM | POA: Diagnosis not present

## 2022-03-11 DIAGNOSIS — H9190 Unspecified hearing loss, unspecified ear: Secondary | ICD-10-CM | POA: Diagnosis not present

## 2022-03-11 DIAGNOSIS — Z87891 Personal history of nicotine dependence: Secondary | ICD-10-CM | POA: Diagnosis not present

## 2022-03-11 DIAGNOSIS — K449 Diaphragmatic hernia without obstruction or gangrene: Secondary | ICD-10-CM | POA: Diagnosis not present

## 2022-03-11 DIAGNOSIS — Z6823 Body mass index (BMI) 23.0-23.9, adult: Secondary | ICD-10-CM | POA: Diagnosis not present

## 2022-03-11 DIAGNOSIS — I10 Essential (primary) hypertension: Secondary | ICD-10-CM | POA: Diagnosis not present

## 2022-03-11 DIAGNOSIS — J449 Chronic obstructive pulmonary disease, unspecified: Secondary | ICD-10-CM | POA: Diagnosis present

## 2022-03-11 DIAGNOSIS — R531 Weakness: Secondary | ICD-10-CM | POA: Diagnosis not present

## 2022-03-11 DIAGNOSIS — E876 Hypokalemia: Secondary | ICD-10-CM | POA: Diagnosis not present

## 2022-03-11 DIAGNOSIS — E861 Hypovolemia: Secondary | ICD-10-CM | POA: Diagnosis present

## 2022-03-11 DIAGNOSIS — N390 Urinary tract infection, site not specified: Principal | ICD-10-CM

## 2022-03-11 DIAGNOSIS — R410 Disorientation, unspecified: Secondary | ICD-10-CM | POA: Diagnosis present

## 2022-03-11 DIAGNOSIS — H409 Unspecified glaucoma: Secondary | ICD-10-CM | POA: Diagnosis not present

## 2022-03-11 DIAGNOSIS — R52 Pain, unspecified: Secondary | ICD-10-CM | POA: Diagnosis not present

## 2022-03-11 DIAGNOSIS — N1330 Unspecified hydronephrosis: Secondary | ICD-10-CM | POA: Diagnosis not present

## 2022-03-11 DIAGNOSIS — Z882 Allergy status to sulfonamides status: Secondary | ICD-10-CM

## 2022-03-11 DIAGNOSIS — J90 Pleural effusion, not elsewhere classified: Secondary | ICD-10-CM | POA: Diagnosis not present

## 2022-03-11 DIAGNOSIS — Z7982 Long term (current) use of aspirin: Secondary | ICD-10-CM

## 2022-03-11 DIAGNOSIS — R109 Unspecified abdominal pain: Secondary | ICD-10-CM | POA: Diagnosis not present

## 2022-03-11 DIAGNOSIS — E872 Acidosis, unspecified: Secondary | ICD-10-CM | POA: Diagnosis not present

## 2022-03-11 DIAGNOSIS — B9562 Methicillin resistant Staphylococcus aureus infection as the cause of diseases classified elsewhere: Secondary | ICD-10-CM | POA: Diagnosis present

## 2022-03-11 DIAGNOSIS — Z960 Presence of urogenital implants: Secondary | ICD-10-CM | POA: Diagnosis not present

## 2022-03-11 DIAGNOSIS — R279 Unspecified lack of coordination: Secondary | ICD-10-CM | POA: Diagnosis not present

## 2022-03-11 DIAGNOSIS — R630 Anorexia: Secondary | ICD-10-CM | POA: Diagnosis present

## 2022-03-11 DIAGNOSIS — N200 Calculus of kidney: Secondary | ICD-10-CM

## 2022-03-11 DIAGNOSIS — Z880 Allergy status to penicillin: Secondary | ICD-10-CM

## 2022-03-11 DIAGNOSIS — H548 Legal blindness, as defined in USA: Secondary | ICD-10-CM | POA: Diagnosis not present

## 2022-03-11 DIAGNOSIS — I509 Heart failure, unspecified: Secondary | ICD-10-CM | POA: Diagnosis not present

## 2022-03-11 DIAGNOSIS — Z743 Need for continuous supervision: Secondary | ICD-10-CM | POA: Diagnosis not present

## 2022-03-11 DIAGNOSIS — L8990 Pressure ulcer of unspecified site, unspecified stage: Secondary | ICD-10-CM

## 2022-03-11 DIAGNOSIS — I5030 Unspecified diastolic (congestive) heart failure: Secondary | ICD-10-CM | POA: Diagnosis not present

## 2022-03-11 DIAGNOSIS — I700 Atherosclerosis of aorta: Secondary | ICD-10-CM | POA: Diagnosis not present

## 2022-03-11 DIAGNOSIS — R0902 Hypoxemia: Secondary | ICD-10-CM | POA: Diagnosis not present

## 2022-03-11 DIAGNOSIS — N201 Calculus of ureter: Secondary | ICD-10-CM | POA: Diagnosis not present

## 2022-03-11 DIAGNOSIS — E559 Vitamin D deficiency, unspecified: Secondary | ICD-10-CM | POA: Diagnosis not present

## 2022-03-11 DIAGNOSIS — N136 Pyonephrosis: Secondary | ICD-10-CM | POA: Diagnosis not present

## 2022-03-11 DIAGNOSIS — G9341 Metabolic encephalopathy: Secondary | ICD-10-CM | POA: Diagnosis not present

## 2022-03-11 DIAGNOSIS — I110 Hypertensive heart disease with heart failure: Secondary | ICD-10-CM | POA: Diagnosis not present

## 2022-03-11 DIAGNOSIS — L89152 Pressure ulcer of sacral region, stage 2: Secondary | ICD-10-CM | POA: Diagnosis not present

## 2022-03-11 DIAGNOSIS — L89103 Pressure ulcer of unspecified part of back, stage 3: Secondary | ICD-10-CM | POA: Diagnosis not present

## 2022-03-11 DIAGNOSIS — R262 Difficulty in walking, not elsewhere classified: Secondary | ICD-10-CM | POA: Diagnosis not present

## 2022-03-11 DIAGNOSIS — I209 Angina pectoris, unspecified: Secondary | ICD-10-CM | POA: Diagnosis not present

## 2022-03-11 DIAGNOSIS — Z881 Allergy status to other antibiotic agents status: Secondary | ICD-10-CM

## 2022-03-11 DIAGNOSIS — Z79899 Other long term (current) drug therapy: Secondary | ICD-10-CM

## 2022-03-11 DIAGNOSIS — E43 Unspecified severe protein-calorie malnutrition: Secondary | ICD-10-CM | POA: Diagnosis not present

## 2022-03-11 DIAGNOSIS — Z8614 Personal history of Methicillin resistant Staphylococcus aureus infection: Secondary | ICD-10-CM | POA: Diagnosis not present

## 2022-03-11 DIAGNOSIS — I480 Paroxysmal atrial fibrillation: Secondary | ICD-10-CM | POA: Diagnosis not present

## 2022-03-11 LAB — URINALYSIS, ROUTINE W REFLEX MICROSCOPIC
Bilirubin Urine: NEGATIVE
Glucose, UA: NEGATIVE mg/dL
Hgb urine dipstick: NEGATIVE
Ketones, ur: NEGATIVE mg/dL
Nitrite: NEGATIVE
Protein, ur: 100 mg/dL — AB
Specific Gravity, Urine: 1.015 (ref 1.005–1.030)
WBC, UA: >50 WBC/hpf — ABNORMAL HIGH (ref 0–5)
pH: 5 (ref 5.0–8.0)

## 2022-03-11 LAB — COMPREHENSIVE METABOLIC PANEL
ALT: 9 U/L (ref 0–44)
AST: 16 U/L (ref 15–41)
Albumin: 2.7 g/dL — ABNORMAL LOW (ref 3.5–5.0)
Alkaline Phosphatase: 68 U/L (ref 38–126)
Anion gap: 9 (ref 5–15)
BUN: 14 mg/dL (ref 8–23)
CO2: 18 mmol/L — ABNORMAL LOW (ref 22–32)
Calcium: 7.9 mg/dL — ABNORMAL LOW (ref 8.9–10.3)
Chloride: 109 mmol/L (ref 98–111)
Creatinine, Ser: 0.76 mg/dL (ref 0.44–1.00)
GFR, Estimated: >60 mL/min (ref >60)
Glucose, Bld: 90 mg/dL (ref 70–99)
Potassium: 3 mmol/L — ABNORMAL LOW (ref 3.5–5.1)
Sodium: 136 mmol/L (ref 135–145)
Total Bilirubin: 0.6 mg/dL (ref 0.3–1.2)
Total Protein: 6.5 g/dL (ref 6.5–8.1)

## 2022-03-11 LAB — LACTIC ACID, PLASMA
Lactic Acid, Venous: 1.2 mmol/L (ref 0.5–1.9)
Lactic Acid, Venous: 1.1 mmol/L (ref 0.5–1.9)

## 2022-03-11 LAB — CBC WITH DIFFERENTIAL/PLATELET
Abs Immature Granulocytes: 0.06 10*3/uL (ref 0.00–0.07)
Basophils Absolute: 0 10*3/uL (ref 0.0–0.1)
Basophils Relative: 0 %
Eosinophils Absolute: 0 10*3/uL (ref 0.0–0.5)
Eosinophils Relative: 0 %
HCT: 31.1 % — ABNORMAL LOW (ref 36.0–46.0)
Hemoglobin: 9.6 g/dL — ABNORMAL LOW (ref 12.0–15.0)
Immature Granulocytes: 1 %
Lymphocytes Relative: 14 %
Lymphs Abs: 1.6 10*3/uL (ref 0.7–4.0)
MCH: 24.1 pg — ABNORMAL LOW (ref 26.0–34.0)
MCHC: 30.9 g/dL (ref 30.0–36.0)
MCV: 78.1 fL — ABNORMAL LOW (ref 80.0–100.0)
Monocytes Absolute: 1.3 10*3/uL — ABNORMAL HIGH (ref 0.1–1.0)
Monocytes Relative: 12 %
Neutro Abs: 7.9 10*3/uL — ABNORMAL HIGH (ref 1.7–7.7)
Neutrophils Relative %: 73 %
Platelets: 539 10*3/uL — ABNORMAL HIGH (ref 150–400)
RBC: 3.98 MIL/uL (ref 3.87–5.11)
RDW: 19.2 % — ABNORMAL HIGH (ref 11.5–15.5)
WBC: 10.9 10*3/uL — ABNORMAL HIGH (ref 4.0–10.5)
nRBC: 0 % (ref 0.0–0.2)

## 2022-03-11 LAB — RESP PANEL BY RT-PCR (FLU A&B, COVID) ARPGX2
Influenza A by PCR: NEGATIVE
Influenza B by PCR: NEGATIVE
SARS Coronavirus 2 by RT PCR: NEGATIVE

## 2022-03-11 LAB — TROPONIN I (HIGH SENSITIVITY)
Troponin I (High Sensitivity): 5 ng/L (ref <18)
Troponin I (High Sensitivity): 5 ng/L (ref <18)

## 2022-03-11 MED ORDER — SODIUM CHLORIDE 0.9 % IV SOLN
1.0000 g | Freq: Once | INTRAVENOUS | Status: AC
  Administered 2022-03-11: 1 g via INTRAVENOUS
  Filled 2022-03-11: qty 10

## 2022-03-11 MED ORDER — SODIUM CHLORIDE 0.9 % IV BOLUS
500.0000 mL | Freq: Once | INTRAVENOUS | Status: AC
  Administered 2022-03-11: 500 mL via INTRAVENOUS

## 2022-03-11 MED ORDER — POTASSIUM CHLORIDE CRYS ER 20 MEQ PO TBCR
40.0000 meq | EXTENDED_RELEASE_TABLET | Freq: Once | ORAL | Status: AC
  Administered 2022-03-11: 40 meq via ORAL
  Filled 2022-03-11: qty 2

## 2022-03-11 MED ORDER — ACETAMINOPHEN 325 MG PO TABS
650.0000 mg | ORAL_TABLET | Freq: Once | ORAL | Status: AC
Start: 2022-03-11 — End: 2022-03-11
  Administered 2022-03-11: 650 mg via ORAL
  Filled 2022-03-11: qty 2

## 2022-03-11 NOTE — ED Provider Notes (Signed)
?[facility] ?Provider Note ? ? ?CSN: [id number] ?Arrival date & time: 03/11/22  1828 ? ?  ? ?History ? ?Chief Complaint  ?Patient presents with  ? Weakness  ? ? ?[name] is a 88 y.o. female. ? ?Patient presents with chief complaint of generalized weakness decreased appetite and confusion.  She is taking care of by her daughter who states is becoming more more difficult to manage this patient.  Symptoms have been progressive over the last 3 to 4 days.  Patient is no longer able to walk now has been bedbound for the past 2 days.  No reports of any fevers or vomiting or diarrhea.  Patient has intermittent abdominal pain for the past 2 days as well. ? ? ?  ? ?Home Medications ?Prior to Admission medications   ?Medication Sig Start Date End Date Taking? Authorizing Provider  ?acetaminophen (TYLENOL) 325 MG tablet Take 650 mg by mouth at bedtime.   Yes [provider]  ?ALPHAGAN P 0.1 % SOLN Place 1 drop into both eyes 2 (two) times daily. 08/05/20  Yes [provider]  ?aspirin EC 81 MG tablet Take 81 mg by mouth daily. Swallow whole.   Yes [provider]  ?Calcium-Vitamin D-Vitamin K (VIACTIV PO) Take 2 tablets by mouth daily.   Yes [provider]  ?cyanocobalamin (,VITAMIN B-12,) 1000 MCG/ML injection Inject 1,000 mcg into the muscle every 30 (thirty) days. 08/05/20  Yes [provider]  ?diltiazem (CARDIZEM CD) 180 MG 24 hr capsule Take 180 mg by mouth daily. 06/25/20  Yes [provider]  ?dorzolamide-timolol (COSOPT) 22.3-6.8 MG/ML ophthalmic solution Place 1 drop into both eyes 2 (two) times daily. 08/05/20  Yes [provider]  ?fluconazole (DIFLUCAN) 150 MG tablet Take 150 mg by mouth. Every 4 days for 3 doses 03/03/22  Yes [provider]  ?guaiFENesin (MUCINEX) 600 MG 12 hr tablet Take 600 mg by mouth daily as needed (congestion).   Yes [provider]  ?levofloxacin (LEVAQUIN) 500 MG tablet Take 250 mg by  mouth daily. 03/06/22  Yes [provider]  ?melatonin 5 MG TABS Take 5 mg by mouth at bedtime.   Yes [provider]  ?Multiple Vitamins-Minerals (PRESERVISION/LUTEIN PO) Take 1 tablet by mouth 2 (two) times daily.   Yes [provider]  ?nystatin (MYCOSTATIN) 100000 UNIT/ML suspension Take 5-10 mLs by mouth 4 (four) times daily. 03/03/22  Yes [provider]  ?omeprazole (PRILOSEC) 20 MG capsule Take 20 mg by mouth daily. 05/31/20  Yes [provider]  ?Polyethyl Glycol-Propyl Glycol (SYSTANE) 0.4-0.3 % SOLN Place 1 drop into both eyes every 6 (six) hours as needed (dry eyes).   Yes [provider]  ?TOBRADEX ophthalmic ointment Place 1 application. into both eyes 2 (two) times daily. 10/27/21  Yes [provider]  ?albuterol (VENTOLIN HFA) 108 (90 Base) MCG/ACT inhaler Inhale 2 puffs into the lungs every 6 (six) hours. ?Patient not taking: Reported on 02/12/2022 08/19/20   [place] Donnamarie Poag, DO  ?cephALEXin (KEFLEX) 500 MG capsule Take 1 capsule (500 mg total) by mouth 3 (three) times daily. ?Patient not taking: Reported on 03/11/2022 02/26/22   Raynelle Bring, MD  ?ciprofloxacin (CIPRO) 500 MG tablet Take 1 tablet (500 mg total) by mouth 2 (two) times daily. One po bid x 7 days ?Patient not taking: Reported on 03/11/2022 03/01/22   Milton Ferguson, MD  ?diclofenac Sodium (VOLTAREN) 1 % GEL He is 3 times a day as needed for  pain ?Patient not taking: Reported on 03/11/2022 03/01/22   Milton Ferguson, MD  ?oxyCODONE-acetaminophen (PERCOCET) 5-325 MG tablet Use 1 every 6 hours as needed for severe pain ?Patient not taking: Reported on 03/11/2022 03/01/22   Milton Ferguson, MD  ?   ? ?Allergies    ?Iohexol, Strawberry (diagnostic), Sulfa antibiotics, Travatan [travoprost], Xalatan [latanoprost], Azithromycin, Doxycycline, Gentamicin, Ibandronic acid, Nitrofurantoin, Penicillins, and Prednisone   ? ?Review of Systems   ?Review of Systems  ?Constitutional:  Negative for  fever.  ?HENT:  Negative for ear pain.   ?Eyes:  Negative for pain.  ?Respiratory:  Negative for cough.   ?Cardiovascular:  Negative for chest pain.  ?Gastrointestinal:  Positive for abdominal pain.  ?Genitourinary:  Negative for flank pain.  ?Musculoskeletal:  Negative for back pain.  ?Skin:  Negative for rash.  ?Neurological:  Negative for headaches.  ? ?Physical Exam ?Updated Vital Signs ?BP (!) 156/111   Pulse 94   Temp 98 ?F (36.7 ?C) (Oral)   Resp (!) 29   SpO2 93%  ?Physical Exam ?Constitutional:   ?   Comments: Very tired appearing  ?HENT:  ?   Head: Normocephalic.  ?   Nose: Nose normal.  ?Eyes:  ?   Extraocular Movements: Extraocular movements intact.  ?Cardiovascular:  ?   Rate and Rhythm: Normal rate.  ?Pulmonary:  ?   Effort: Pulmonary effort is normal.  ?Abdominal:  ?   Tenderness: There is no abdominal tenderness. There is no guarding or rebound.  ?Musculoskeletal:     ?   General: Normal range of motion.  ?   Cervical back: Normal range of motion.  ?Neurological:  ?   Mental Status: She is alert.  ?   Comments: Wake, alert, answers questions.  Appears to be at her baseline at this moment but family states her mental status waxes and wanes.  No focal neurodeficit noted moving all extremities with 5/5 strength.  Presents with generalized weakness, unable to push herself up from the bed or to transition out of the bed without significant assistance.  ? ? ?ED Results / Procedures / Treatments   ?Labs ?(all labs ordered are listed, but only abnormal results are displayed) ?Labs Reviewed  ?CBC WITH DIFFERENTIAL/PLATELET - Abnormal; Notable for the following components:  ?    Result Value  ? WBC 10.9 (*)   ? Hemoglobin 9.6 (*)   ? HCT 31.1 (*)   ? MCV 78.1 (*)   ? MCH 24.1 (*)   ? RDW 19.2 (*)   ? Platelets 539 (*)   ? Neutro Abs 7.9 (*)   ? Monocytes Absolute 1.3 (*)   ? All other components within normal limits  ?COMPREHENSIVE METABOLIC PANEL - Abnormal; Notable for the following components:  ?  Potassium 3.0 (*)   ? CO2 18 (*)   ? Calcium 7.9 (*)   ? Albumin 2.7 (*)   ? All other components within normal limits  ?URINALYSIS, ROUTINE W REFLEX MICROSCOPIC - Abnormal; Notable for the following components:  ? APPearance HAZY (*)   ? Protein, ur 100 (*)   ? Leukocytes,Ua LARGE (*)   ? WBC, UA >50 (*)   ? Bacteria, UA RARE (*)   ? All other components within normal limits  ?RESP PANEL BY RT-PCR (FLU A&B, COVID) [id number]  ?CULTURE, BLOOD (ROUTINE X 2)  ?CULTURE, BLOOD (ROUTINE X 2)  ?URINE CULTURE  ?LACTIC ACID, PLASMA  ?LACTIC ACID, PLASMA  ?TROPONIN I (HIGH SENSITIVITY)  ?TROPONIN I (HIGH  SENSITIVITY)  ? ? ?EKG ?None ? ?Radiology ?CT Abdomen Pelvis Wo Contrast ? ?Result Date: 03/11/2022 ?CLINICAL DATA:  Weakness abdominal pain decreased appetite EXAM: CT ABDOMEN AND PELVIS WITHOUT CONTRAST TECHNIQUE: Multidetector CT imaging of the abdomen and pelvis was performed following the standard protocol without IV contrast. RADIATION DOSE REDUCTION: This exam was performed according to the departmental dose-optimization program which includes automated exposure control, adjustment of the mA and/or kV according to patient size and/or use of iterative reconstruction technique. COMPARISON:  CT 03/01/2022, 11/29/2021 FINDINGS: Lower chest: Lung bases demonstrate similar moderate bilateral pleural effusions and passive lower lobe atelectasis. Borderline cardiomegaly with coronary vascular calcification. Moderate hiatal hernia. Hepatobiliary: No focal liver abnormality is seen. No gallstones, gallbladder wall thickening, or biliary dilatation. Pancreas: Unremarkable. No pancreatic ductal dilatation or surrounding inflammatory changes. Spleen: Normal in size without focal abnormality. Adrenals/Urinary Tract: Adrenal glands are within normal limits. Hypodensity upper pole left kidney probably a cyst but incompletely visualized without contrast. Interim removal of left-sided ureteral stent. Development of moderate severe left  hydronephrosis and hydroureter. Multiple layering stones within the mid to lower pole left kidney. Diffuse stones/calcific density extending from the mid ureter to the distal ureter about 1 cm proximal to the left UVJ. Mi

## 2022-03-11 NOTE — ED Notes (Signed)
Patient voided, urine specimen sent. ?

## 2022-03-11 NOTE — ED Triage Notes (Signed)
Pt presents to ED via [facility] for generalized weakness and decreased appetite for last couple of days. Pt with UTI.  ?

## 2022-03-12 ENCOUNTER — Inpatient Hospital Stay (HOSPITAL_COMMUNITY): Payer: Medicare Other

## 2022-03-12 ENCOUNTER — Encounter (HOSPITAL_COMMUNITY): Payer: Self-pay | Admitting: Internal Medicine

## 2022-03-12 ENCOUNTER — Inpatient Hospital Stay (HOSPITAL_COMMUNITY): Payer: Medicare Other | Admitting: Certified Registered Nurse Anesthetist

## 2022-03-12 ENCOUNTER — Encounter (HOSPITAL_COMMUNITY)
Admission: EM | Disposition: A | Discharge status: Skilled Nursing Facility | Payer: Self-pay | Source: Home / Self Care | Attending: Internal Medicine

## 2022-03-12 DIAGNOSIS — Z87442 Personal history of urinary calculi: Secondary | ICD-10-CM | POA: Diagnosis not present

## 2022-03-12 DIAGNOSIS — J449 Chronic obstructive pulmonary disease, unspecified: Secondary | ICD-10-CM

## 2022-03-12 DIAGNOSIS — N201 Calculus of ureter: Secondary | ICD-10-CM

## 2022-03-12 DIAGNOSIS — E861 Hypovolemia: Secondary | ICD-10-CM | POA: Diagnosis present

## 2022-03-12 DIAGNOSIS — Z91041 Radiographic dye allergy status: Secondary | ICD-10-CM | POA: Diagnosis not present

## 2022-03-12 DIAGNOSIS — K449 Diaphragmatic hernia without obstruction or gangrene: Secondary | ICD-10-CM | POA: Diagnosis present

## 2022-03-12 DIAGNOSIS — Z888 Allergy status to other drugs, medicaments and biological substances status: Secondary | ICD-10-CM | POA: Diagnosis not present

## 2022-03-12 DIAGNOSIS — R410 Disorientation, unspecified: Secondary | ICD-10-CM | POA: Diagnosis present

## 2022-03-12 DIAGNOSIS — I509 Heart failure, unspecified: Secondary | ICD-10-CM

## 2022-03-12 DIAGNOSIS — N1330 Unspecified hydronephrosis: Secondary | ICD-10-CM | POA: Diagnosis not present

## 2022-03-12 DIAGNOSIS — Z87891 Personal history of nicotine dependence: Secondary | ICD-10-CM | POA: Diagnosis not present

## 2022-03-12 DIAGNOSIS — N390 Urinary tract infection, site not specified: Secondary | ICD-10-CM | POA: Diagnosis not present

## 2022-03-12 DIAGNOSIS — Z6823 Body mass index (BMI) 23.0-23.9, adult: Secondary | ICD-10-CM | POA: Diagnosis not present

## 2022-03-12 DIAGNOSIS — H547 Unspecified visual loss: Secondary | ICD-10-CM

## 2022-03-12 DIAGNOSIS — Z20822 Contact with and (suspected) exposure to covid-19: Secondary | ICD-10-CM | POA: Diagnosis present

## 2022-03-12 DIAGNOSIS — I209 Angina pectoris, unspecified: Secondary | ICD-10-CM | POA: Diagnosis not present

## 2022-03-12 DIAGNOSIS — B9562 Methicillin resistant Staphylococcus aureus infection as the cause of diseases classified elsewhere: Secondary | ICD-10-CM | POA: Diagnosis present

## 2022-03-12 DIAGNOSIS — I10 Essential (primary) hypertension: Secondary | ICD-10-CM | POA: Diagnosis present

## 2022-03-12 DIAGNOSIS — H548 Legal blindness, as defined in USA: Secondary | ICD-10-CM | POA: Diagnosis present

## 2022-03-12 DIAGNOSIS — I110 Hypertensive heart disease with heart failure: Secondary | ICD-10-CM | POA: Diagnosis not present

## 2022-03-12 DIAGNOSIS — E872 Acidosis, unspecified: Secondary | ICD-10-CM | POA: Diagnosis present

## 2022-03-12 DIAGNOSIS — Z7982 Long term (current) use of aspirin: Secondary | ICD-10-CM | POA: Diagnosis not present

## 2022-03-12 DIAGNOSIS — D649 Anemia, unspecified: Secondary | ICD-10-CM | POA: Diagnosis present

## 2022-03-12 DIAGNOSIS — Z882 Allergy status to sulfonamides status: Secondary | ICD-10-CM | POA: Diagnosis not present

## 2022-03-12 DIAGNOSIS — N136 Pyonephrosis: Secondary | ICD-10-CM | POA: Diagnosis present

## 2022-03-12 DIAGNOSIS — Z880 Allergy status to penicillin: Secondary | ICD-10-CM | POA: Diagnosis not present

## 2022-03-12 DIAGNOSIS — R630 Anorexia: Secondary | ICD-10-CM | POA: Diagnosis present

## 2022-03-12 DIAGNOSIS — Z881 Allergy status to other antibiotic agents status: Secondary | ICD-10-CM | POA: Diagnosis not present

## 2022-03-12 DIAGNOSIS — G9341 Metabolic encephalopathy: Secondary | ICD-10-CM | POA: Diagnosis present

## 2022-03-12 DIAGNOSIS — Z7401 Bed confinement status: Secondary | ICD-10-CM | POA: Diagnosis not present

## 2022-03-12 DIAGNOSIS — E876 Hypokalemia: Secondary | ICD-10-CM | POA: Diagnosis present

## 2022-03-12 DIAGNOSIS — K219 Gastro-esophageal reflux disease without esophagitis: Secondary | ICD-10-CM | POA: Diagnosis present

## 2022-03-12 HISTORY — PX: CYSTOSCOPY W/ URETERAL STENT PLACEMENT: SHX1429

## 2022-03-12 LAB — BASIC METABOLIC PANEL
Anion gap: 8 (ref 5–15)
BUN: 11 mg/dL (ref 8–23)
CO2: 16 mmol/L — ABNORMAL LOW (ref 22–32)
Calcium: 7.9 mg/dL — ABNORMAL LOW (ref 8.9–10.3)
Chloride: 113 mmol/L — ABNORMAL HIGH (ref 98–111)
Creatinine, Ser: 0.76 mg/dL (ref 0.44–1.00)
GFR, Estimated: >60 mL/min (ref >60)
Glucose, Bld: 105 mg/dL — ABNORMAL HIGH (ref 70–99)
Potassium: 3.1 mmol/L — ABNORMAL LOW (ref 3.5–5.1)
Sodium: 137 mmol/L (ref 135–145)

## 2022-03-12 LAB — CBC
HCT: 31.5 % — ABNORMAL LOW (ref 36.0–46.0)
Hemoglobin: 10.1 g/dL — ABNORMAL LOW (ref 12.0–15.0)
MCH: 25.8 pg — ABNORMAL LOW (ref 26.0–34.0)
MCHC: 32.1 g/dL (ref 30.0–36.0)
MCV: 80.6 fL (ref 80.0–100.0)
Platelets: 601 10*3/uL — ABNORMAL HIGH (ref 150–400)
RBC: 3.91 MIL/uL (ref 3.87–5.11)
RDW: 20.1 % — ABNORMAL HIGH (ref 11.5–15.5)
WBC: 13.1 10*3/uL — ABNORMAL HIGH (ref 4.0–10.5)
nRBC: 0 % (ref 0.0–0.2)

## 2022-03-12 LAB — MAGNESIUM: Magnesium: 1.7 mg/dL (ref 1.7–2.4)

## 2022-03-12 SURGERY — CYSTOSCOPY, WITH RETROGRADE PYELOGRAM AND URETERAL STENT INSERTION
Anesthesia: General | Site: Ureter | Laterality: Left

## 2022-03-12 MED ORDER — GUAIFENESIN ER 600 MG PO TB12: 600.0000 mg | ORAL_TABLET | Freq: Every day | ORAL | Status: DC | PRN

## 2022-03-12 MED ORDER — POTASSIUM CL IN DEXTROSE 5% 20 MEQ/L IV SOLN
20.0000 meq | INTRAVENOUS | Status: DC
  Administered 2022-03-12: 20 meq via INTRAVENOUS

## 2022-03-12 MED ORDER — LACTATED RINGERS IV SOLN: INTRAVENOUS | Status: DC

## 2022-03-12 MED ORDER — ENOXAPARIN SODIUM 30 MG/0.3ML IJ SOSY
30.0000 mg | PREFILLED_SYRINGE | INTRAMUSCULAR | Status: DC
  Administered 2022-03-12 – 2022-03-15 (×4): 30 mg via SUBCUTANEOUS
  Filled 2022-03-12 (×4): qty 0.3

## 2022-03-12 MED ORDER — PROPOFOL 10 MG/ML IV BOLUS
INTRAVENOUS | Status: AC
  Filled 2022-03-12: qty 20

## 2022-03-12 MED ORDER — ONDANSETRON HCL 4 MG/2ML IJ SOLN
4.0000 mg | Freq: Four times a day (QID) | INTRAMUSCULAR | Status: DC | PRN
Start: 2022-03-12 — End: 2022-03-15

## 2022-03-12 MED ORDER — POLYETHYL GLYCOL-PROPYL GLYCOL 0.4-0.3 % OP SOLN: 1.0000 [drp] | Freq: Four times a day (QID) | OPHTHALMIC | Status: DC | PRN

## 2022-03-12 MED ORDER — ONDANSETRON HCL 4 MG/2ML IJ SOLN
INTRAMUSCULAR | Status: AC
  Filled 2022-03-12: qty 2

## 2022-03-12 MED ORDER — DIATRIZOATE MEGLUMINE 30 % UR SOLN
URETHRAL | Status: DC | PRN
Start: 2022-03-12 — End: 2022-03-12
  Administered 2022-03-12: 12 mL via URETHRAL

## 2022-03-12 MED ORDER — PHENYLEPHRINE 80 MCG/ML (10ML) SYRINGE FOR IV PUSH (FOR BLOOD PRESSURE SUPPORT)
PREFILLED_SYRINGE | INTRAVENOUS | Status: AC
  Filled 2022-03-12: qty 10

## 2022-03-12 MED ORDER — HALOPERIDOL LACTATE 5 MG/ML IJ SOLN: 0.5000 mg | Freq: Four times a day (QID) | INTRAMUSCULAR | Status: DC | PRN

## 2022-03-12 MED ORDER — CHLORHEXIDINE GLUCONATE 0.12 % MT SOLN: 15.0000 mL | Freq: Once | OROMUCOSAL | Status: DC

## 2022-03-12 MED ORDER — ADULT MULTIVITAMIN W/MINERALS CH
1.0000 | ORAL_TABLET | Freq: Every day | ORAL | Status: DC
  Administered 2022-03-13 – 2022-03-15 (×3): 1 via ORAL
  Filled 2022-03-12 (×5): qty 1

## 2022-03-12 MED ORDER — POLYVINYL ALCOHOL 1.4 % OP SOLN
1.0000 [drp] | Freq: Four times a day (QID) | OPHTHALMIC | Status: DC | PRN
  Administered 2022-03-12 – 2022-03-15 (×2): 1 [drp] via OPHTHALMIC
  Filled 2022-03-12: qty 15

## 2022-03-12 MED ORDER — DILTIAZEM HCL ER COATED BEADS 180 MG PO CP24
180.0000 mg | ORAL_CAPSULE | Freq: Every day | ORAL | Status: DC
  Administered 2022-03-13 – 2022-03-15 (×3): 180 mg via ORAL
  Filled 2022-03-12 (×3): qty 1

## 2022-03-12 MED ORDER — MELATONIN 3 MG PO TABS
6.0000 mg | ORAL_TABLET | Freq: Every day | ORAL | Status: DC
  Administered 2022-03-12 – 2022-03-14 (×3): 6 mg via ORAL
  Filled 2022-03-12 (×3): qty 2

## 2022-03-12 MED ORDER — TOBRAMYCIN-DEXAMETHASONE 0.3-0.1 % OP OINT
1.0000 "application " | TOPICAL_OINTMENT | Freq: Two times a day (BID) | OPHTHALMIC | Status: DC
  Filled 2022-03-12: qty 3.5

## 2022-03-12 MED ORDER — PANTOPRAZOLE SODIUM 40 MG PO TBEC
40.0000 mg | DELAYED_RELEASE_TABLET | Freq: Every day | ORAL | Status: DC
  Administered 2022-03-13 – 2022-03-15 (×3): 40 mg via ORAL
  Filled 2022-03-12 (×3): qty 1

## 2022-03-12 MED ORDER — LIDOCAINE 2% (20 MG/ML) 5 ML SYRINGE
INTRAMUSCULAR | Status: DC | PRN
  Administered 2022-03-12: 40 mg via INTRAVENOUS

## 2022-03-12 MED ORDER — POTASSIUM CHLORIDE 10 MEQ/100ML IV SOLN
10.0000 meq | INTRAVENOUS | Status: AC
  Administered 2022-03-12 (×4): 10 meq via INTRAVENOUS
  Filled 2022-03-12: qty 100

## 2022-03-12 MED ORDER — TOBRAMYCIN-DEXAMETHASONE 0.3-0.1 % OP SUSP
1.0000 [drp] | Freq: Four times a day (QID) | OPHTHALMIC | Status: DC
  Administered 2022-03-12 – 2022-03-15 (×13): 1 [drp] via OPHTHALMIC
  Filled 2022-03-12: qty 2.5

## 2022-03-12 MED ORDER — PROPOFOL 10 MG/ML IV BOLUS
INTRAVENOUS | Status: DC | PRN
Start: 2022-03-12 — End: 2022-03-12
  Administered 2022-03-12: 80 mg via INTRAVENOUS

## 2022-03-12 MED ORDER — ONDANSETRON HCL 4 MG/2ML IJ SOLN: 4.0000 mg | Freq: Once | INTRAMUSCULAR | Status: DC | PRN

## 2022-03-12 MED ORDER — ASPIRIN EC 81 MG PO TBEC
81.0000 mg | DELAYED_RELEASE_TABLET | Freq: Every day | ORAL | Status: DC
  Administered 2022-03-13 – 2022-03-15 (×3): 81 mg via ORAL
  Filled 2022-03-12 (×3): qty 1

## 2022-03-12 MED ORDER — SODIUM CHLORIDE 0.9 % IV SOLN
1.0000 g | INTRAVENOUS | Status: DC
  Administered 2022-03-12 – 2022-03-13 (×2): 1 g via INTRAVENOUS
  Filled 2022-03-12 (×2): qty 10

## 2022-03-12 MED ORDER — BRIMONIDINE TARTRATE 0.15 % OP SOLN
1.0000 [drp] | Freq: Two times a day (BID) | OPHTHALMIC | Status: DC
  Administered 2022-03-12 – 2022-03-15 (×6): 1 [drp] via OPHTHALMIC
  Filled 2022-03-12: qty 5

## 2022-03-12 MED ORDER — ONDANSETRON HCL 4 MG PO TABS: 4.0000 mg | ORAL_TABLET | Freq: Four times a day (QID) | ORAL | Status: DC | PRN

## 2022-03-12 MED ORDER — DIATRIZOATE MEGLUMINE 30 % UR SOLN
URETHRAL | Status: AC
  Filled 2022-03-12: qty 100

## 2022-03-12 MED ORDER — FENTANYL CITRATE (PF) 100 MCG/2ML IJ SOLN
INTRAMUSCULAR | Status: AC
  Filled 2022-03-12: qty 2

## 2022-03-12 MED ORDER — WATER FOR IRRIGATION, STERILE IR SOLN
Status: DC | PRN
  Administered 2022-03-12: 500 mL

## 2022-03-12 MED ORDER — ACETAMINOPHEN 325 MG PO TABS
650.0000 mg | ORAL_TABLET | Freq: Every day | ORAL | Status: DC
  Administered 2022-03-12 – 2022-03-14 (×3): 650 mg via ORAL
  Filled 2022-03-12 (×3): qty 2

## 2022-03-12 MED ORDER — PHENYLEPHRINE 80 MCG/ML (10ML) SYRINGE FOR IV PUSH (FOR BLOOD PRESSURE SUPPORT)
PREFILLED_SYRINGE | INTRAVENOUS | Status: DC | PRN
  Administered 2022-03-12 (×6): 80 ug via INTRAVENOUS

## 2022-03-12 MED ORDER — ORAL CARE MOUTH RINSE: 15.0000 mL | Freq: Once | OROMUCOSAL | Status: DC

## 2022-03-12 MED ORDER — MORPHINE SULFATE (PF) 2 MG/ML IV SOLN
1.0000 mg | INTRAVENOUS | Status: DC | PRN
  Administered 2022-03-12 – 2022-03-14 (×7): 1 mg via INTRAVENOUS
  Filled 2022-03-12 (×7): qty 1

## 2022-03-12 MED ORDER — FENTANYL CITRATE PF 50 MCG/ML IJ SOSY: 25.0000 ug | PREFILLED_SYRINGE | INTRAMUSCULAR | Status: DC | PRN

## 2022-03-12 MED ORDER — HALOPERIDOL 0.5 MG PO TABS: 0.5000 mg | ORAL_TABLET | Freq: Four times a day (QID) | ORAL | Status: DC | PRN

## 2022-03-12 MED ORDER — LEVOFLOXACIN 500 MG PO TABS: 250.0000 mg | ORAL_TABLET | Freq: Every day | ORAL | Status: DC

## 2022-03-12 MED ORDER — CYANOCOBALAMIN 1000 MCG/ML IJ SOLN: 1000.0000 ug | INTRAMUSCULAR | Status: DC

## 2022-03-12 MED ORDER — FENTANYL CITRATE (PF) 100 MCG/2ML IJ SOLN
INTRAMUSCULAR | Status: DC | PRN
  Administered 2022-03-12: 25 ug via INTRAVENOUS

## 2022-03-12 MED ORDER — NYSTATIN 100000 UNIT/ML MT SUSP
5.0000 mL | Freq: Four times a day (QID) | OROMUCOSAL | Status: DC
  Administered 2022-03-12 – 2022-03-13 (×2): 1000000 [IU] via ORAL
  Administered 2022-03-13: 10 mL via ORAL
  Administered 2022-03-13: 1000000 [IU] via ORAL
  Administered 2022-03-13 – 2022-03-15 (×6): 500000 [IU] via ORAL
  Filled 2022-03-12: qty 10
  Filled 2022-03-12 (×5): qty 5
  Filled 2022-03-12 (×2): qty 10
  Filled 2022-03-12: qty 5

## 2022-03-12 MED ORDER — DEXTROSE 5 % IV SOLN: INTRAVENOUS | Status: DC

## 2022-03-12 MED ORDER — SODIUM CHLORIDE 0.9 % IR SOLN
Status: DC | PRN
Start: 2022-03-12 — End: 2022-03-12
  Administered 2022-03-12: 3000 mL

## 2022-03-12 MED ORDER — DORZOLAMIDE HCL-TIMOLOL MAL 2-0.5 % OP SOLN
1.0000 [drp] | Freq: Two times a day (BID) | OPHTHALMIC | Status: DC
  Administered 2022-03-12 – 2022-03-15 (×6): 1 [drp] via OPHTHALMIC
  Filled 2022-03-12 (×2): qty 10

## 2022-03-12 MED ORDER — ONDANSETRON HCL 4 MG/2ML IJ SOLN
INTRAMUSCULAR | Status: DC | PRN
  Administered 2022-03-12: 4 mg via INTRAVENOUS

## 2022-03-12 SURGICAL SUPPLY — 23 items
BAG DRAIN URO TABLE W/ADPT NS (BAG) ×4 IMPLANT
BAG DRN 8 ADPR NS SKTRN CSTL (BAG) ×2
BAG HAMPER (MISCELLANEOUS) ×4 IMPLANT
CATH INTERMIT  6FR 70CM (CATHETERS) ×4 IMPLANT
CLOTH BEACON ORANGE TIMEOUT ST (SAFETY) ×4 IMPLANT
EXTRACTOR STONE NITINOL NGAGE (UROLOGICAL SUPPLIES) ×4 IMPLANT
FIBER LASER FLEXIVA 200 (UROLOGICAL SUPPLIES) IMPLANT
GLOVE BIO SURGEON STRL SZ8 (GLOVE) ×4 IMPLANT
GLOVE BIOGEL PI IND STRL 7.0 (GLOVE) ×6 IMPLANT
GLOVE BIOGEL PI INDICATOR 7.0 (GLOVE) ×2
GOWN STRL REUS W/TWL LRG LVL3 (GOWN DISPOSABLE) ×4 IMPLANT
GOWN STRL REUS W/TWL XL LVL3 (GOWN DISPOSABLE) ×4 IMPLANT
GUIDEWIRE ANG ZIPWIRE 038X150 (WIRE) ×4 IMPLANT
GUIDEWIRE STR DUAL SENSOR (WIRE) ×4 IMPLANT
IV NS IRRIG 3000ML ARTHROMATIC (IV SOLUTION) ×8 IMPLANT
KIT TURNOVER CYSTO (KITS) ×4 IMPLANT
MANIFOLD NEPTUNE II (INSTRUMENTS) ×4 IMPLANT
PACK CYSTO (CUSTOM PROCEDURE TRAY) ×4 IMPLANT
PAD ARMBOARD 7.5X6 YLW CONV (MISCELLANEOUS) ×4 IMPLANT
STENT URET 6FRX26 CONTOUR (STENTS) ×2 IMPLANT
SYR 10ML LL (SYRINGE) ×4 IMPLANT
TOWEL OR 17X26 4PK STRL BLUE (TOWEL DISPOSABLE) ×4 IMPLANT
WATER STERILE IRR 500ML POUR (IV SOLUTION) ×4 IMPLANT

## 2022-03-12 NOTE — Assessment & Plan Note (Signed)
Continue blood pressure control with diltiazem per her home regimen.  ?

## 2022-03-12 NOTE — Op Note (Signed)
.  Preoperative diagnosis: Left ureteral stones ? ?Postoperative diagnosis: Same ? ?Procedure: 1 cystoscopy ?2. Left retrograde pyelography ?3.  Intraoperative fluoroscopy, under one hour, with interpretation ?4.  Left ureteroscopic stone manipulation with basket extraction ?5.  Left 6 x 26 JJ stent placement ? ?Attending: Wilkie Aye ? ?Anesthesia: General ? ?Estimated blood loss: None ? ?Drains: Left 6 x 26 JJ ureteral stent without tether ? ?Specimens: stone analysis ? ?Antibiotics: rocephin ? ?Findings: numerous distal and mid ureteral calculi. Severe hydronephrosis. Purulent drainage from left kidney after removing ureteral calculi. No masses/lesions in the bladder. Ureteral orifices in normal anatomic location. ? ?Indications: Patient is a 88 year old female with a history of left ureteral stones and who has persistent left flank pain.  After discussing treatment options, she decided proceed with left ureteroscopic stone manipulation. ? ?Procedure in detail: The patient was brought to the operating room and a brief timeout was done to ensure correct patient, correct procedure, correct site.  General anesthesia was administered patient was placed in dorsal lithotomy position.  Her genitalia was then prepped and draped in usual sterile fashion.  A rigid 22 French cystoscope was passed in the urethra and the bladder.  Bladder was inspected free masses or lesions.  the ureteral orifices were in the normal orthotopic locations.  a 6 french ureteral catheter was then instilled into the left ureteral orifice.  a gentle retrograde was obtained and findings noted above.  we then placed a zip wire through the ureteral catheter and advanced up to the renal pelvis.  we then removed the cystoscope and cannulated the left ureteral orifice with a semirigid ureteroscope.  We encountered numerous fragmented calculi in the distal and mid ureter. the fragments were then removed with a Ngage basket. Once the majority stone  fragments were removed we encountered purulent drainage from the left ureter. We then elected to place a stent. We placed a 6 x 26 double-j ureteral stent over the original zip wire.  We then removed the wire and good coil was noted in the the renal pelvis under fluoroscopy and the bladder under direct vision.     the stone fragments were then removed from the bladder and sent for analysis.   the bladder was then drained and this concluded the procedure which was well tolerated by patient. ? ?Complications: None ? ?Condition: Stable, extubated, transferred to PACU ? ?Plan: Patient is to be admitted for IV antibiotics pending her urine culture. She will be scheduled for repeat ureteroscopic stone extraction in 2 weeks ?

## 2022-03-12 NOTE — Progress Notes (Signed)
? ?[facility] ?PROGRESS NOTE ? ? ?[name] [id number] DOB: 29-Apr-1926 DOA: 03/11/2022 ? ?PCP: Sharilyn Sites, MD ? ?Brief History/Interval Summary: 88 y.o. female with medical history significant for COPD, blindness, GERD and renal stones. Patient was brought to the hospital by her family due to worsening confusion. She was admitted to the hospital on 02/26/22 for left ureteroscopic laser lithotripsy and stent placement by Dr Alinda Money. The day before admission the stent was removed. Patient was noted to have worsening mentation since the stent removal procedure, patient confused, disorientated and agitated. Not able to sleep for 24 hrs and poor oral intake.  ? ?Consultants: Urology consult is pending ? ?Procedures: None ? ? ? ?Subjective/Interval History: ?Patient is somewhat distracted.  Sleepy this morning but easily arousable.  Does not appear to be in any discomfort.  She does complain of pain in the left back area.  Denies any shortness of breath.  No chest pain.  Did not answer orientation questions. ? ? ? ?Assessment/Plan: ? ?Acute metabolic encephalopathy ?Reason for this is not entirely clear.  Could be medication induced. ?She was noted to be afebrile.  UA was noted to be abnormal with large leukocytes and greater than 50 WBC.  Urine culture is pending.  Patient empirically started on ceftriaxone. ?CT scan of the head did not show any acute findings.  No obvious neurological deficits noted. ?Reorient daily.  PT and OT evaluation. ? ?Left-sided hydronephrosis/urinary tract infection ?Likely secondary to stone burden.  EDP had contacted urology.  Await their input this morning.  She was hospitalized in April for left ureteroscopic laser lithotripsy and stent placement by Dr. Alinda Money.  CT scan done at the time of admission does not show any stent.  Must have been removed in the interim. ?Monitor urine output.  Monitor renal function. ?Follow urine cultures.  Continue ceftriaxone for  now. ? ?Essential hypertension ?Monitor blood pressures closely.  Noted to be on diltiazem. ? ?Hypokalemia/normal anion gap metabolic acidosis ?Will be repleted intravenously.  Metabolic acidosis possibly from hypovolemia.  Renal function is normal.  She has been gently hydrated.  We will recheck labs tomorrow ? ?Normocytic anemia ?Hemoglobin is stable.  No evidence of overt bleeding. ? ?COPD ?Respiratory status noted to be stable.  Continue to monitor ? ?Blindness ?Continue with eyedrops ? ? ?DVT Prophylaxis: Lovenox ?Code Status: Full code ?Family Communication: No family at bedside ?Disposition Plan: To be determined.  Will involve PT and OT. ? ?Status is: Inpatient ?Remains inpatient appropriate because: Left-sided hydronephrosis, urinary tract infection, acute metabolic encephalopathy ? ? ? ?Medications: Scheduled: ? acetaminophen  650 mg Oral QHS  ? aspirin EC  81 mg Oral Daily  ? brimonidine  1 drop Both Eyes BID  ? [START ON 04/07/2022] cyanocobalamin  1,000 mcg Intramuscular Q30 days  ? diltiazem  180 mg Oral Daily  ? dorzolamide-timolol  1 drop Both Eyes BID  ? enoxaparin (LOVENOX) injection  30 mg Subcutaneous Q24H  ? melatonin  6 mg Oral QHS  ? nystatin  5-10 mL Oral QID  ? pantoprazole  40 mg Oral Daily  ? tobramycin-dexamethasone  1 drop Both Eyes Q6H  ? ?Continuous: ? cefTRIAXone (ROCEPHIN)  IV    ? dextrose 5 % with KCl 20 mEq / L    ? potassium chloride 10 mEq (03/12/22 0952)  ? ?[id number], haloperidol **OR** haloperidol lactate, morphine injection, ondansetron **OR** ondansetron (ZOFRAN) IV, polyvinyl alcohol ? ?Antibiotics: ?Anti-infectives (From admission, onward)  ? ? Start  Dose/Rate Route Frequency Ordered Stop  ? 03/12/22 2200  cefTRIAXone (ROCEPHIN) 1 g in sodium chloride 0.9 % 100 mL IVPB       ? 1 g ?200 mL/hr over 30 Minutes Intravenous Every 24 hours 03/12/22 0149    ? 03/12/22 1000  levofloxacin (LEVAQUIN) tablet 250 mg  Status:  Discontinued       ? 250 mg Oral Daily 03/12/22  0147 03/12/22 0207  ? 03/11/22 2245  cefTRIAXone (ROCEPHIN) 1 g in sodium chloride 0.9 % 100 mL IVPB       ? 1 g ?200 mL/hr over 30 Minutes Intravenous  Once 03/11/22 2239 03/12/22 0014  ? ?  ? ? ?Objective: ? ?Vital Signs ? ?Vitals:  ? 03/12/22 0000 03/12/22 0030 03/12/22 0120 03/12/22 0449  ?BP: (!) 130/101 136/87 (!) 175/88 (!) 169/79  ?Pulse: (!) 104 98 100 98  ?Resp: (!) 25 (!) 22 19 18   ?Temp:   97.6 ?F (36.4 ?C) 98.4 ?F (36.9 ?C)  ?TempSrc:   Oral   ?SpO2: 96% 97% 92% 97%  ? ? ?Intake/Output Summary (Last 24 hours) at 03/12/2022 0951 ?Last data filed at 03/12/2022 0600 ?Gross per 24 hour  ?Intake 765.73 ml  ?Output 100 ml  ?Net 665.73 ml  ? ?There were no vitals filed for this visit. ? ?General appearance: Sleepy but arousable.  Somewhat distracted.  Does not appear to be in any discomfort. ?Resp: Clear to auscultation bilaterally.  Normal effort ?Cardio: S1-S2 is normal regular.  No S3-S4.  No rubs murmurs or bruit ?GI: Abdomen is soft.  Nontender nondistended.  Bowel sounds are present normal.  No masses organomegaly ?Extremities: No edema.  Physical deconditioning noted. ?Neurologic: No focal neurological deficits.  ? ? ?Lab Results: ? ?Data Reviewed: I have personally reviewed following labs and reports of the imaging studies ? ?CBC: ?Recent Labs  ?Lab 03/11/22 ?1931 03/12/22 ?0430  ?WBC 10.9* 13.1*  ?NEUTROABS 7.9*  --   ?HGB 9.6* 10.1*  ?HCT 31.1* 31.5*  ?MCV 78.1* 80.6  ?PLT 539* 601*  ? ? ?Basic Metabolic Panel: ?Recent Labs  ?Lab 03/11/22 ?1931 03/12/22 ?0430  ?NA 136 137  ?K 3.0* 3.1*  ?CL 109 113*  ?CO2 18* 16*  ?GLUCOSE 90 105*  ?BUN 14 11  ?CREATININE 0.76 0.76  ?CALCIUM 7.9* 7.9*  ?MG  --  1.7  ? ? ?GFR: ?Estimated Creatinine Clearance: 25.5 mL/min (by C-G formula based on SCr of 0.76 mg/dL). ? ?Liver Function Tests: ?Recent Labs  ?Lab 03/11/22 ?1931  ?AST 16  ?ALT 9  ?ALKPHOS 68  ?BILITOT 0.6  ?PROT 6.5  ?ALBUMIN 2.7*  ? ? ? ? ?Recent Results (from the past 240 hour(s))  ?Resp Panel by RT-PCR  (Flu A&B, Covid) Nasopharyngeal Swab     Status: None  ? Collection Time: 03/11/22  7:31 PM  ? Specimen: Nasopharyngeal Swab; Nasopharyngeal(NP) swabs in vial transport medium  ?Result Value Ref Range Status  ? SARS Coronavirus 2 by RT PCR NEGATIVE NEGATIVE Final  ?  Comment: (NOTE) ?SARS-CoV-2 target nucleic acids are NOT DETECTED. ? ?The SARS-CoV-2 RNA is generally detectable in upper respiratory ?specimens during the acute phase of infection. The lowest ?concentration of SARS-CoV-2 viral copies this assay can detect is ?138 copies/mL. A negative result does not preclude SARS-Cov-2 ?infection and should not be used as the sole basis for treatment or ?other patient management decisions. A negative result may occur with  ?improper specimen collection/handling, submission of specimen other ?than nasopharyngeal swab, presence of viral  mutation(s) within the ?areas targeted by this assay, and inadequate number of viral ?copies(<138 copies/mL). A negative result must be combined with ?clinical observations, patient history, and epidemiological ?information. The expected result is Negative. ? ?Fact Sheet for Patients:  ?EntrepreneurPulse.com.au ? ?Fact Sheet for Healthcare Providers:  ?IncredibleEmployment.be ? ?This test is no t yet approved or cleared by the [place] FDA and  ?has been authorized for detection and/or diagnosis of SARS-CoV-2 by ?FDA under an Emergency Use Authorization (EUA). This EUA will remain  ?in effect (meaning this test can be used) for the duration of the ?COVID-19 declaration under Section 564(b)(1) of the Act, 21 ?U.S.C.section 360bbb-3(b)(1), unless the authorization is terminated  ?or revoked sooner.  ? ? ?  ? Influenza A by PCR NEGATIVE NEGATIVE Final  ? Influenza B by PCR NEGATIVE NEGATIVE Final  ?  Comment: (NOTE) ?The Xpert Xpress SARS-CoV-2/FLU/RSV plus assay is intended as an aid ?in the diagnosis of influenza from Nasopharyngeal swab specimens  and ?should not be used as a sole basis for treatment. Nasal washings and ?aspirates are unacceptable for Xpert Xpress SARS-CoV-2/FLU/RSV ?testing. ? ?Fact Sheet for Patients: ?EntrepreneurPulse.com.au ?

## 2022-03-12 NOTE — Assessment & Plan Note (Signed)
Continue with eye drops. ? ?

## 2022-03-12 NOTE — Consult Note (Signed)
Urology Consult  ?Referring physician: Dr. Rito Ehrlich ?Reason for referral: left hydronephrosis, left ureteral calculi ? ?Chief Complaint: Left abdominal pain ? ?History of Present Illness: [name] is a 88yo with a history of nephrolithiasis who presented to the ER last night with worsening left abdominal pain over the past 3-4 days. She underwent left ureteroscopic stone extraction 02/26/2022 with Dr. Laverle Patter and then here stent was removed 1 week later. The abdominal pain is sharp constant, severe and nonraditing. She has associated nausea but no vomiting. No worsening LUTS. She is afebrile. WBC count 13. CT from yesterday shows multiple left proximal and mid ureteral calculi with severe hydronephrosis.  No other associated symptoms. No exacerbating/alleviating events.  ? ?Past Medical History:  ?Diagnosis Date  ? Anginal pain (HCC)   ? Blind   ? COPD (chronic obstructive pulmonary disease) (HCC)   ? COVID   ? GERD (gastroesophageal reflux disease)   ? History of hiatal hernia   ? HOH (hard of hearing)   ? Hypertension   ? Legally blind   ? ?Past Surgical History:  ?Procedure Laterality Date  ? CYSTOSCOPY W/ URETERAL STENT PLACEMENT Left 11/30/2021  ? Procedure: CYSTOSCOPY WITH RETROGRADE PYELOGRAM/URETERAL STENT PLACEMENT;  Surgeon: Heloise Purpura, MD;  Location: [facility];  Service: Urology;  Laterality: Left;  ? CYSTOSCOPY/URETEROSCOPY/HOLMIUM LASER/STENT PLACEMENT Left 02/26/2022  ? Procedure: CYSTOSCOPY/URETEROSCOPY/HOLMIUM LASER/STENT PLACEMENT;  Surgeon: Heloise Purpura, MD;  Location: [facility];  Service: Urology;  Laterality: Left;  ? ? ?Medications: I have reviewed the patient's current medications. ?Allergies:  ?Allergies  ?Allergen Reactions  ? Iohexol Itching, Swelling and Rash  ?  pt allergic to prednisone she needs IV premeds  ? Strawberry (Diagnostic)   ? Sulfa Antibiotics   ? Travatan [Travoprost] Itching  ? Xalatan [Latanoprost] Itching  ? Azithromycin Rash  ? Doxycycline Rash  ? Gentamicin Rash  ?  Ibandronic Acid Rash  ? Nitrofurantoin Rash  ? Penicillins Rash  ? Prednisone Rash  ? ? ?History reviewed. No pertinent family history. ?Social History:  reports that she has quit smoking. She has never used smokeless tobacco. She reports that she does not drink alcohol and does not use drugs. ? ?Review of Systems  ?Gastrointestinal:  Positive for abdominal pain.  ?Genitourinary:  Positive for flank pain.  ?All other systems reviewed and are negative. ? ?Physical Exam:  ?Vital signs in last 24 hours: ?Temp:  [97.6 ?F (36.4 ?C)-98.4 ?F (36.9 ?C)] 98.1 ?F (36.7 ?C) (05/11 1116) ?Pulse Rate:  [94-104] 100 (05/11 1116) ?Resp:  [18-29] 18 (05/11 1116) ?BP: (116-175)/(64-111) 155/82 (05/11 1116) ?SpO2:  [92 %-98 %] 98 % (05/11 1116) ?Physical Exam ?Constitutional:   ?   Appearance: Normal appearance.  ?HENT:  ?   Head: Normocephalic and atraumatic.  ?   Nose: Nose normal.  ?   Mouth/Throat:  ?   Mouth: Mucous membranes are dry.  ?Eyes:  ?   Extraocular Movements: Extraocular movements intact.  ?   Pupils: Pupils are equal, round, and reactive to light.  ?Cardiovascular:  ?   Rate and Rhythm: Normal rate and regular rhythm.  ?Pulmonary:  ?   Effort: Pulmonary effort is normal. No respiratory distress.  ?Abdominal:  ?   General: Abdomen is flat. There is no distension.  ?Musculoskeletal:     ?   General: No swelling. Normal range of motion.  ?   Cervical back: Normal range of motion and neck supple.  ?Skin: ?   General: Skin is warm and dry.  ?Neurological:  ?  General: No focal deficit present.  ?   Mental Status: She is alert. Mental status is at baseline.  ?Psychiatric:     ?   Mood and Affect: Mood normal.     ?   Behavior: Behavior normal.     ?   Thought Content: Thought content normal.  ? ? ?Laboratory Data:  ?Results for orders placed or performed during the hospital encounter of 03/11/22 (from the past 72 hour(s))  ?Resp Panel by RT-PCR (Flu A&B, Covid) Nasopharyngeal Swab     Status: None  ? Collection Time:  03/11/22  7:31 PM  ? Specimen: Nasopharyngeal Swab; Nasopharyngeal(NP) swabs in vial transport medium  ?Result Value Ref Range  ? SARS Coronavirus 2 by RT PCR NEGATIVE NEGATIVE  ?  Comment: (NOTE) ?SARS-CoV-2 target nucleic acids are NOT DETECTED. ? ?The SARS-CoV-2 RNA is generally detectable in upper respiratory ?specimens during the acute phase of infection. The lowest ?concentration of SARS-CoV-2 viral copies this assay can detect is ?138 copies/mL. A negative result does not preclude SARS-Cov-2 ?infection and should not be used as the sole basis for treatment or ?other patient management decisions. A negative result may occur with  ?improper specimen collection/handling, submission of specimen other ?than nasopharyngeal swab, presence of viral mutation(s) within the ?areas targeted by this assay, and inadequate number of viral ?copies(<138 copies/mL). A negative result must be combined with ?clinical observations, patient history, and epidemiological ?information. The expected result is Negative. ? ?Fact Sheet for Patients:  ?BloggerCourse.comhttps://www.fda.gov/media/152166/download ? ?Fact Sheet for Healthcare Providers:  ?SeriousBroker.ithttps://www.fda.gov/media/152162/download ? ?This test is no t yet approved or cleared by the [place] FDA and  ?has been authorized for detection and/or diagnosis of SARS-CoV-2 by ?FDA under an Emergency Use Authorization (EUA). This EUA will remain  ?in effect (meaning this test can be used) for the duration of the ?COVID-19 declaration under Section 564(b)(1) of the Act, 21 ?U.S.C.section 360bbb-3(b)(1), unless the authorization is terminated  ?or revoked sooner.  ? ? ?  ? Influenza A by PCR NEGATIVE NEGATIVE  ? Influenza B by PCR NEGATIVE NEGATIVE  ?  Comment: (NOTE) ?The Xpert Xpress SARS-CoV-2/FLU/RSV plus assay is intended as an aid ?in the diagnosis of influenza from Nasopharyngeal swab specimens and ?should not be used as a sole basis for treatment. Nasal washings and ?aspirates are  unacceptable for Xpert Xpress SARS-CoV-2/FLU/RSV ?testing. ? ?Fact Sheet for Patients: ?BloggerCourse.comhttps://www.fda.gov/media/152166/download ? ?Fact Sheet for Healthcare Providers: ?SeriousBroker.ithttps://www.fda.gov/media/152162/download ? ?This test is not yet approved or cleared by the [place] FDA and ?has been authorized for detection and/or diagnosis of SARS-CoV-2 by ?FDA under an Emergency Use Authorization (EUA). This EUA will remain ?in effect (meaning this test can be used) for the duration of the ?COVID-19 declaration under Section 564(b)(1) of the Act, 21 U.S.C. ?section 360bbb-3(b)(1), unless the authorization is terminated or ?revoked. ? ?Performed at [facility], [address] [place], [place] [address] ?  ?CBC with Differential     Status: Abnormal  ? Collection Time: 03/11/22  7:31 PM  ?Result Value Ref Range  ? WBC 10.9 (H) 4.0 - 10.5 K/uL  ? RBC 3.98 3.87 - 5.11 MIL/uL  ? Hemoglobin 9.6 (L) 12.0 - 15.0 g/dL  ? HCT 31.1 (L) 36.0 - 46.0 %  ? MCV 78.1 (L) 80.0 - 100.0 fL  ? MCH 24.1 (L) 26.0 - 34.0 pg  ? MCHC 30.9 30.0 - 36.0 g/dL  ? RDW 19.2 (H) 11.5 - 15.5 %  ? Platelets 539 (H) 150 - 400  K/uL  ? nRBC 0.0 0.0 - 0.2 %  ? Neutrophils Relative % 73 %  ? Neutro Abs 7.9 (H) 1.7 - 7.7 K/uL  ? Lymphocytes Relative 14 %  ? Lymphs Abs 1.6 0.7 - 4.0 K/uL  ? Monocytes Relative 12 %  ? Monocytes Absolute 1.3 (H) 0.1 - 1.0 K/uL  ? Eosinophils Relative 0 %  ? Eosinophils Absolute 0.0 0.0 - 0.5 K/uL  ? Basophils Relative 0 %  ? Basophils Absolute 0.0 0.0 - 0.1 K/uL  ? Immature Granulocytes 1 %  ? Abs Immature Granulocytes 0.06 0.00 - 0.07 K/uL  ?  Comment: Performed at [facility], [address], [place], [place] [address]  ?Comprehensive metabolic panel     Status: Abnormal  ? Collection Time: 03/11/22  7:31 PM  ?Result Value Ref Range  ? Sodium 136 135 - 145 mmol/L  ? Potassium 3.0 (L) 3.5 - 5.1 mmol/L  ? Chloride 109 98 - 111 mmol/L  ? CO2 18 (L) 22 - 32 mmol/L  ? Glucose, Bld 90 70 - 99 mg/dL  ?  Comment: Glucose reference  range applies only to samples taken after fasting for at least 8 hours.  ? BUN 14 8 - 23 mg/dL  ? Creatinine, Ser 0.76 0.44 - 1.00 mg/dL  ? Calcium 7.9 (L) 8.9 - 10.3 mg/dL  ? Total Protein 6.5 6.5 - 8.1 g/dL  ? Albumin 2.7 (L

## 2022-03-12 NOTE — Anesthesia Procedure Notes (Signed)
Procedure Name: LMA Insertion ?Date/Time: 03/12/2022 12:28 PM ?Performed by: Julian Reil, CRNA ?Pre-anesthesia Checklist: Patient identified, Emergency Drugs available, Suction available and Patient being monitored ?Patient Re-evaluated:Patient Re-evaluated prior to induction ?Oxygen Delivery Method: Circle system utilized ?Preoxygenation: Pre-oxygenation with 100% oxygen ?Induction Type: IV induction ?LMA: LMA inserted ?LMA Size: 3.0 ?Tube type: Oral ?Number of attempts: 1 ?Placement Confirmation: positive ETCO2 ?Tube secured with: Tape ?Dental Injury: Teeth and Oropharynx as per pre-operative assessment  ? ? ? ? ?

## 2022-03-12 NOTE — Interval H&P Note (Signed)
History and Physical Interval Note: ? ?03/12/2022 ?11:50 AM ? ?[name]  has presented today for surgery, with the diagnosis of left ureteral stone.  The various methods of treatment have been discussed with the patient and family. After consideration of risks, benefits and other options for treatment, the patient has consented to  Procedure(s): ?CYSTOSCOPY/URETEROSCOPY/HOLMIUM LASER/STENT PLACEMENT (Left) ?CYSTOSCOPY WITH RETROGRADE PYELOGRAM (Left) as a surgical intervention.  The patient's history has been reviewed, patient examined, no change in status, stable for surgery.  I have reviewed the patient's chart and labs.  Questions were answered to the patient's satisfaction.   ? ? ?Nicolette Bang ? ? ?

## 2022-03-12 NOTE — Assessment & Plan Note (Signed)
No clinical signs of exacerbation  

## 2022-03-12 NOTE — Progress Notes (Signed)
Initial Nutrition Assessment ? ?DOCUMENTATION CODES:  ? ?  ? ?INTERVENTION:  ?Obtain patient weight  ? ?Follow for diet advancement ?-add Ensure Enlive BID  ?-MVI daily ? ?NUTRITION DIAGNOSIS:  ? ?Inadequate oral intake related to acute illness (UTI) as evidenced by per patient/family report. ? ? ?GOAL:  ?Patient will meet greater than or equal to 90% of their needs ? ? ?MONITOR:  ?PO intake, Labs, Weight trends, Skin ? ?REASON FOR ASSESSMENT:  ? ?Consult ?Assessment of nutrition requirement/status ? ?ASSESSMENT: Patient is a 87 yo hx of HTN, COPD, GERD, legally blindness and anemia. Acute metabolic encephalopathy, UTI.  ? ?Per MD - pt hospitalized on 4/27/ for left ureteroscopic laser lithotripsy and stent placement by Dr Laverle Patter. Urology consulted.  ? ?Patient NPO - for procedure and out of room. Daughter is here and provided nutrition history. Patient lives with daughter. Patient eats mainly vegetables and chicken for primary protein source. She doesn't like ice cream or yogurt.  ? ?Medications: Vitamin B-12, Protonix.  ? ?Weights reviewed. Last weight recorded 4/30 - 44.2 kg. Obtain current weight for assessment. Daughter reports UBW-92-94 lb. Currently 97 lb per chart. BLE edema the past 3-4 weeks.  ? ?IVF-D5% with KCL @ 75 ml/hr to 5/12 -0729.  ? ?Labs: ? ?  Latest Ref Rng & Units 03/12/2022  ?  4:30 AM 03/11/2022  ?  7:31 PM 03/01/2022  ?  8:21 AM  ?BMP  ?Glucose 70 - 99 mg/dL 294   90   93    ?BUN 8 - 23 mg/dL 11   14   20     ?Creatinine 0.44 - 1.00 mg/dL   7.65   4.65    ?Sodium 135 - 145 mmol/L 137   136   141    ?Potassium 3.5 - 5.1 mmol/L 3.1   3.0   3.8    ?Chloride 98 - 111 mmol/L 113   109   112    ?CO2 22 - 32 mmol/L 16   18   23     ?Calcium 8.9 - 10.3 mg/dL 7.9   7.9   8.5    ?   ? ?NUTRITION - FOCUSED PHYSICAL EXAM: ?Deferred to follow up.  ? ? ?Diet Order:   ?Diet Order   ? ?       ?  Diet NPO time specified  Diet effective now       ?  ? ?  ?  ? ?  ? ? ?EDUCATION NEEDS:  ?Not appropriate for  education at this time ? ?Skin:  Skin Assessment: Reviewed RN Assessment ? ?Last BM:  5/11 ? ?Height:  ? ?Ht Readings from Last 1 Encounters:  ?03/01/22 4\' 6"  (1.372 m)  ? ? ?Weight:  ? ?Wt Readings from Last 1 Encounters:  ?03/01/22 44.2 kg  ? ? ?Ideal Body Weight:   45 kg ? ?BMI:  There is no height or weight on file to calculate BMI. ? ?Estimated Nutritional Needs: based on last recorded weight of 44.2 kg ? ?Kcal:  03/03/22 ? ?Protein:  60-65 gr ? ?Fluid:  1100 ml daily ? ? MS,RD,CSG,LDN ?Contact: AMION ? ?

## 2022-03-12 NOTE — Anesthesia Preprocedure Evaluation (Signed)
Anesthesia Evaluation  ?Patient identified by MRN, date of birth, ID band ?Patient awake ? ? ? ?Reviewed: ?Allergy & Precautions, H&P , NPO status , Patient's Chart, lab work & pertinent test results, reviewed documented beta blocker date and time  ? ?Airway ?Mallampati: II ? ?TM Distance: >3 FB ?Neck ROM: full ? ? ? Dental ?no notable dental hx. ? ?  ?Pulmonary ?neg pulmonary ROS, former smoker,  ?  ?Pulmonary exam normal ?breath sounds clear to auscultation ? ? ? ? ? ? Cardiovascular ?Exercise Tolerance: Good ?hypertension, + angina +CHF  ? ?Rhythm:regular Rate:Normal ? ? ?  ?Neuro/Psych ?negative neurological ROS ? negative psych ROS  ? GI/Hepatic ?Neg liver ROS, hiatal hernia, GERD  Medicated,  ?Endo/Other  ?negative endocrine ROS ? Renal/GU ?negative Renal ROS  ?negative genitourinary ?  ?Musculoskeletal ? ? Abdominal ?  ?Peds ? Hematology ? ?(+) Blood dyscrasia, anemia ,   ?Anesthesia Other Findings ? ? Reproductive/Obstetrics ?negative OB ROS ? ?  ? ? ? ? ? ? ? ? ? ? ? ? ? ?  ?  ? ? ? ? ? ? ? ? ?Anesthesia Physical ?Anesthesia Plan ? ?ASA: 3 ? ?Anesthesia Plan: General and General LMA  ? ?Post-op Pain Management:   ? ?Induction:  ? ?PONV Risk Score and Plan: Ondansetron ? ?Airway Management Planned:  ? ?Additional Equipment:  ? ?Intra-op Plan:  ? ?Post-operative Plan:  ? ?Informed Consent: I have reviewed the patients History and Physical, chart, labs and discussed the procedure including the risks, benefits and alternatives for the proposed anesthesia with the patient or authorized representative who has indicated his/her understanding and acceptance.  ? ? ? ?Dental Advisory Given ? ?Plan Discussed with: CRNA ? ?Anesthesia Plan Comments:   ? ? ? ? ? ? ?Anesthesia Quick Evaluation ? ?

## 2022-03-12 NOTE — H&P (Addendum)
History and Physical    Patient: [name]:[id number] DOB: January 04, 1926 DOA: 03/11/2022 DOS: the patient was seen and examined on 03/12/2022 PCP: Assunta Found, MD  Patient coming from: Home  Chief Complaint:  Chief Complaint  Patient presents with   Weakness   HPI: [name] is a 88 y.o. female with medical history significant of COPD, blindness, GERD and renal stones. Patient was brought to the hospital by her family due to worsening confusion. She was admitted to the hospital on 02/26/22 for left ureteroscopic laser lithotripsy and stent placement by Dr Laverle Patter. The day before admission the stent was removed. Patient was noted to have worsening mentation since the stent removal procedure, patient confused, disorientated and agitated. Not able to sleep for 24 hrs and poor oral intake. Information obtained by her family at the bedside, patient not able to provide any history due to cognitive impairment.   Review of Systems: Unable to review all systems due to lack of cooperation from patient. Past Medical History:  Diagnosis Date   Anginal pain (HCC)    Blind    COPD (chronic obstructive pulmonary disease) (HCC)    COVID    GERD (gastroesophageal reflux disease)    History of hiatal hernia    HOH (hard of hearing)    Hypertension    Legally blind    Past Surgical History:  Procedure Laterality Date   CYSTOSCOPY W/ URETERAL STENT PLACEMENT Left 11/30/2021   Procedure: CYSTOSCOPY WITH RETROGRADE PYELOGRAM/URETERAL STENT PLACEMENT;  Surgeon: Heloise Purpura, MD;  Location: [facility];  Service: Urology;  Laterality: Left;   CYSTOSCOPY/URETEROSCOPY/HOLMIUM LASER/STENT PLACEMENT Left 02/26/2022   Procedure: CYSTOSCOPY/URETEROSCOPY/HOLMIUM LASER/STENT PLACEMENT;  Surgeon: Heloise Purpura, MD;  Location: [facility];  Service: Urology;  Laterality: Left;   Social History:  reports that she has quit smoking. She has never used smokeless tobacco. She reports that she does not drink alcohol  and does not use drugs.  Allergies  Allergen Reactions   Iohexol Itching, Swelling and Rash    pt allergic to prednisone she needs IV premeds   Strawberry (Diagnostic)    Sulfa Antibiotics    Travatan [Travoprost] Itching   Xalatan [Latanoprost] Itching   Azithromycin Rash   Doxycycline Rash   Gentamicin Rash   Ibandronic Acid Rash   Nitrofurantoin Rash   Penicillins Rash   Prednisone Rash    History reviewed. No pertinent family history.  Prior to Admission medications   Medication Sig Start Date End Date Taking? Authorizing Provider  acetaminophen (TYLENOL) 325 MG tablet Take 650 mg by mouth at bedtime.   Yes [provider]  ALPHAGAN P 0.1 % SOLN Place 1 drop into both eyes 2 (two) times daily. 08/05/20  Yes [provider]  aspirin EC 81 MG tablet Take 81 mg by mouth daily. Swallow whole.   Yes [provider]  Calcium-Vitamin D-Vitamin K (VIACTIV PO) Take 2 tablets by mouth daily.   Yes [provider]  cyanocobalamin (,VITAMIN B-12,) 1000 MCG/ML injection Inject 1,000 mcg into the muscle every 30 (thirty) days. 08/05/20  Yes [provider]  diltiazem (CARDIZEM CD) 180 MG 24 hr capsule Take 180 mg by mouth daily. 06/25/20  Yes [provider]  dorzolamide-timolol (COSOPT) 22.3-6.8 MG/ML ophthalmic solution Place 1 drop into both eyes 2 (two) times daily. 08/05/20  Yes [provider]  fluconazole (DIFLUCAN) 150 MG tablet Take 150 mg by mouth. Every 4 days for 3 doses 03/03/22  Yes [provider]  guaiFENesin (  MUCINEX) 600 MG 12 hr tablet Take 600 mg by mouth daily as needed (congestion).   Yes [provider]  levofloxacin (LEVAQUIN) 500 MG tablet Take 250 mg by mouth daily. 03/06/22  Yes [provider]  melatonin 5 MG TABS Take 5 mg by mouth at bedtime.   Yes [provider]  Multiple Vitamins-Minerals (PRESERVISION/LUTEIN PO) Take 1 tablet by mouth 2 (two) times daily.   Yes  [provider]  nystatin (MYCOSTATIN) 100000 UNIT/ML suspension Take 5-10 mLs by mouth 4 (four) times daily. 03/03/22  Yes [provider]  omeprazole (PRILOSEC) 20 MG capsule Take 20 mg by mouth daily. 05/31/20  Yes [provider]  Polyethyl Glycol-Propyl Glycol (SYSTANE) 0.4-0.3 % SOLN Place 1 drop into both eyes every 6 (six) hours as needed (dry eyes).   Yes [provider]  TOBRADEX ophthalmic ointment Place 1 application. into both eyes 2 (two) times daily. 10/27/21  Yes [provider]  albuterol (VENTOLIN HFA) 108 (90 Base) MCG/ACT inhaler Inhale 2 puffs into the lungs every 6 (six) hours. Patient not taking: Reported on 02/12/2022 08/19/20   [place], Alvira Philips, DO  cephALEXin (KEFLEX) 500 MG capsule Take 1 capsule (500 mg total) by mouth 3 (three) times daily. Patient not taking: Reported on 03/11/2022 02/26/22   Heloise Purpura, MD  ciprofloxacin (CIPRO) 500 MG tablet Take 1 tablet (500 mg total) by mouth 2 (two) times daily. One po bid x 7 days Patient not taking: Reported on 03/11/2022 03/01/22   Bethann Berkshire, MD  diclofenac Sodium (VOLTAREN) 1 % GEL He is 3 times a day as needed for pain Patient not taking: Reported on 03/11/2022 03/01/22   Bethann Berkshire, MD  oxyCODONE-acetaminophen (PERCOCET) 5-325 MG tablet Use 1 every 6 hours as needed for severe pain Patient not taking: Reported on 03/11/2022 03/01/22   Bethann Berkshire, MD    Physical Exam: Vitals:   03/11/22 2030 03/11/22 2300 03/12/22 0000 03/12/22 0030  BP: (!) 156/111 (!) 141/93 (!) 130/101 136/87  Pulse:  (!) 103 (!) 104 98  Resp: (!) 29 18 (!) 25 (!) 22  Temp:      TempSrc:      SpO2:  97% 96% 97%   Neurology awake and alert, confused and disorientated but able to follow commands ENT with pallor Cardiovascular with S1 and S2 present and rhythmic with no gallops or murmurs Respiratory with no rales or wheezing Abdomen not distended Positive lower extremity edema at the  ankles Data Reviewed:   88 yo female with past medical history of blindness and renal stones, who had recently a urologic procedure with stent removal, who presents with worsening confusion. On her initial physical examination is awake and non focal, disorientated. Her blood pressure is elevated, her RR is high and she is tachycardic. Abdomen not tender.   Na 136, K 3,0 Cl 109, bicarbonate 18, glucose 90 bun 14 and cr 0,76  Wbc 10,9 hgb 9,6 plt 539  Sars covid 19 negative   Head CT negative for acute changes  CT abdomen and pelvis with interim removal of left sided ureteral stent with development of moderate to severe hydronephrosis. Multiple renal stones.   Chest radiograph with increase lung markings bilaterally, with small left pleural effusion.   EKG 67 bpm, normal axis, normal intervals, sinus rhythm with no significant ST segment or T wave changes.   Patient is being admitted to the hospital with the working diagnosis of left hydronephrosis   Assessment and Plan: *  Hydronephrosis Left hydronephrosis, related to renal stones.  ED has contacted urology with recommendations to keep patient NPO for urologic procedure. Continue with IV ceftriaxone for prophylactic antibiotic coverage. Add IV fluids with isotonic saline and dextrose.  Follow up with urology recommendations.   Acute metabolic encephalopathy Patient with acute metabolic encephalopathy complicated with delirium.  Plan to continue supportive medical care with IV fluids and analgesics As needed haldol for agitation. Neuro checks per unit protocol.   Hypertension Continue blood pressure control with diltiazem per her home regimen.   COPD (chronic obstructive pulmonary disease) (HCC) No clinical signs of exacerbation.   Blind Continue with eye drops.       Advance Care Planning:   Code Status: Full Code   Consults: Urology   Family Communication: I spoke with patient's daughter at the bedside, we talked  in detail about patient's condition, plan of care and prognosis and all questions were addressed.   Severity of Illness: The appropriate patient status for this patient is INPATIENT. Inpatient status is judged to be reasonable and necessary in order to provide the required intensity of service to ensure the patient's safety. The patient's presenting symptoms, physical exam findings, and initial radiographic and laboratory data in the context of their chronic comorbidities is felt to place them at high risk for further clinical deterioration. Furthermore, it is not anticipated that the patient will be medically stable for discharge from the hospital within 2 midnights of admission.   * I certify that at the point of admission it is my clinical judgment that the patient will require inpatient hospital care spanning beyond 2 midnights from the point of admission due to high intensity of service, high risk for further deterioration and high frequency of surveillance required.*  Author: Coralie Keens, MD 03/12/2022 1:50 AM  For on call review [email or web address].

## 2022-03-12 NOTE — Plan of Care (Signed)

## 2022-03-12 NOTE — Assessment & Plan Note (Signed)
Patient with acute metabolic encephalopathy complicated with delirium. ? ?Plan to continue supportive medical care with IV fluids and analgesics ?As needed haldol for agitation. ?Neuro checks per unit protocol.  ?

## 2022-03-12 NOTE — Progress Notes (Signed)
Held morning meds because patient is scheduled for a procedure this morning. ?

## 2022-03-12 NOTE — Assessment & Plan Note (Signed)
Left hydronephrosis, related to renal stones. ? ?ED has contacted urology with recommendations to keep patient NPO for urologic procedure. ?Continue with IV ceftriaxone for prophylactic antibiotic coverage. ?Add IV fluids with isotonic saline and dextrose.  ?Follow up with urology recommendations.  ?

## 2022-03-12 NOTE — Transfer of Care (Signed)
Immediate Anesthesia Transfer of Care Note ? ?Patient: [name] ? ?Procedure(s) Performed: CYSTOSCOPY WITH RETROGRADE PYELOGRAM/URETERAL STENT PLACEMENT (Left: Ureter) ? ?Patient Location: PACU ? ?Anesthesia Type:General ? ?Level of Consciousness: drowsy ? ?Airway & Oxygen Therapy: Patient Spontanous Breathing and Patient connected to nasal cannula oxygen ? ?Post-op Assessment: Report given to RN and Post -op Vital signs reviewed and stable ? ?Post vital signs: Reviewed and stable ? ?Last Vitals:  ?Vitals Value Taken Time  ?BP 140/75 03/12/22 1334  ?Temp    ?Pulse 83   ?Resp 16 03/12/22 1337  ?SpO2 96%   ?Vitals shown include unvalidated device data. ? ?Last Pain:  ?Vitals:  ? 03/12/22 1116  ?TempSrc: Axillary  ?PainSc: 4   ?   ? ?Patients Stated Pain Goal: 8 (03/12/22 1116) ? ?Complications: No notable events documented. ?

## 2022-03-13 DIAGNOSIS — R319 Hematuria, unspecified: Secondary | ICD-10-CM

## 2022-03-13 DIAGNOSIS — N390 Urinary tract infection, site not specified: Secondary | ICD-10-CM

## 2022-03-13 LAB — CBC
HCT: 31 % — ABNORMAL LOW (ref 36.0–46.0)
Hemoglobin: 9.7 g/dL — ABNORMAL LOW (ref 12.0–15.0)
MCH: 24.7 pg — ABNORMAL LOW (ref 26.0–34.0)
MCHC: 31.3 g/dL (ref 30.0–36.0)
MCV: 79.1 fL — ABNORMAL LOW (ref 80.0–100.0)
Platelets: 503 10*3/uL — ABNORMAL HIGH (ref 150–400)
RBC: 3.92 MIL/uL (ref 3.87–5.11)
RDW: 19.8 % — ABNORMAL HIGH (ref 11.5–15.5)
WBC: 11 10*3/uL — ABNORMAL HIGH (ref 4.0–10.5)
nRBC: 0 % (ref 0.0–0.2)

## 2022-03-13 LAB — BASIC METABOLIC PANEL
Anion gap: 4 — ABNORMAL LOW (ref 5–15)
BUN: 7 mg/dL — ABNORMAL LOW (ref 8–23)
CO2: 21 mmol/L — ABNORMAL LOW (ref 22–32)
Calcium: 8 mg/dL — ABNORMAL LOW (ref 8.9–10.3)
Chloride: 113 mmol/L — ABNORMAL HIGH (ref 98–111)
Creatinine, Ser: 0.64 mg/dL (ref 0.44–1.00)
GFR, Estimated: >60 mL/min (ref >60)
Glucose, Bld: 114 mg/dL — ABNORMAL HIGH (ref 70–99)
Potassium: 3.8 mmol/L (ref 3.5–5.1)
Sodium: 138 mmol/L (ref 135–145)

## 2022-03-13 LAB — MRSA NEXT GEN BY PCR, NASAL: MRSA by PCR Next Gen: DETECTED — AB

## 2022-03-13 LAB — MAGNESIUM: Magnesium: 1.7 mg/dL (ref 1.7–2.4)

## 2022-03-13 MED ORDER — MUPIROCIN CALCIUM 2 % EX CREA
TOPICAL_CREAM | Freq: Two times a day (BID) | CUTANEOUS | Status: DC
  Filled 2022-03-13: qty 15

## 2022-03-13 MED ORDER — DEXTROSE-NACL 5-0.45 % IV SOLN
INTRAVENOUS | Status: AC
Start: 2022-03-13 — End: 2022-03-14

## 2022-03-13 MED ORDER — MAGNESIUM SULFATE 2 GM/50ML IV SOLN
2.0000 g | Freq: Once | INTRAVENOUS | Status: AC
  Administered 2022-03-13: 2 g via INTRAVENOUS
  Filled 2022-03-13: qty 50

## 2022-03-13 MED ORDER — POTASSIUM CHLORIDE CRYS ER 20 MEQ PO TBCR
40.0000 meq | EXTENDED_RELEASE_TABLET | Freq: Once | ORAL | Status: AC
  Administered 2022-03-13: 40 meq via ORAL
  Filled 2022-03-13: qty 2

## 2022-03-13 NOTE — Anesthesia Postprocedure Evaluation (Signed)
Anesthesia Post Note ? ?Patient: CLEVA CAMERO ? ?Procedure(s) Performed: CYSTOSCOPY WITH RETROGRADE PYELOGRAM/URETERAL STENT PLACEMENT (Left: Ureter) ? ?Patient location during evaluation: Phase II ?Anesthesia Type: General ?Level of consciousness: awake ?Pain management: pain level controlled ?Vital Signs Assessment: post-procedure vital signs reviewed and stable ?Respiratory status: spontaneous breathing and respiratory function stable ?Cardiovascular status: blood pressure returned to baseline and stable ?Postop Assessment: no headache and no apparent nausea or vomiting ?Anesthetic complications: no ?Comments: Late entry ? ? ?No notable events documented. ? ? ?Last Vitals:  ?Vitals:  ? 03/12/22 2233 03/13/22 0445  ?BP: 127/81 (!) 138/91  ?Pulse: 98 99  ?Resp: 18 18  ?Temp: 36.8 ?C 37 ?C  ?SpO2: 100% 98%  ?  ?Last Pain:  ?Vitals:  ? 03/13/22 0628  ?TempSrc:   ?PainSc: 3   ? ? ?  ?  ?  ?  ?  ?  ? ?Windell [place] ? ? ? ? ?

## 2022-03-13 NOTE — TOC Progression Note (Signed)
Transition of Care (TOC) - Progression Note  ? ? ?Patient Details  ?Name: [name] ?MRN: [id number] ?Date of Birth: 1926/07/11 ? ?Transition of Care (TOC) CM/SW Contact  ?Joaquin Courts, RN ?Phone Number: ?03/13/2022, 3:11 PM ? ?Clinical Narrative:    ?CM met with daughter at bedside and presented bed offers for SNF.  Daughter selects [facility].  Facility rep [facility] notified of selection.  TOC will initiate auth.  Per Lenna Sciara, please call her at [phone] in the event of a weekend discharge.  ? ? ?Expected Discharge Plan: [place] ?Barriers to Discharge: Continued Medical Work up ? ?Expected Discharge Plan and Services ?Expected Discharge Plan: [place] ?  ?Discharge Planning Services: CM Consult ?Post Acute Care Choice: [place] ?Living arrangements for the past 2 months: [place] ?                ?  ?  ?  ?  ?  ?  ?  ?  ?  ?  ? ? ?Social Determinants of Health (SDOH) Interventions ?  ? ?Readmission Risk Interventions ? ?  03/13/2022  ? 12:09 PM 08/19/2020  ? 11:50 AM  ?Readmission Risk Prevention Plan  ?Post Dischage Appt  Complete  ?Medication Screening  Complete  ?Transportation Screening Complete Complete  ?[place] or Home Care Consult Complete   ?Social Work Consult for [place] Planning/Counseling Complete   ?Palliative Care Screening Not Applicable   ?Medication Review Press photographer) Complete   ? ? ?

## 2022-03-13 NOTE — Evaluation (Signed)
Clinical/Bedside Swallow Evaluation ?Patient Details  ?Name: [name] ?MRN: [id number] ?Date of Birth: 04/05/1926 ? ?Today's Date: 03/13/2022 ?Time: SLP Start Time (ACUTE ONLY): 0845 SLP Stop Time (ACUTE ONLY): [id number] ?SLP Time Calculation (min) (ACUTE ONLY): 32 min ? ?Past Medical History:  ?Past Medical History:  ?Diagnosis Date  ? Anginal pain ([place])   ? Blind   ? COPD (chronic obstructive pulmonary disease) ([place])   ? COVID   ? GERD (gastroesophageal reflux disease)   ? History of hiatal hernia   ? HOH (hard of hearing)   ? Hypertension   ? Legally blind   ? ?Past Surgical History:  ?Past Surgical History:  ?Procedure Laterality Date  ? CYSTOSCOPY W/ URETERAL STENT PLACEMENT Left 11/30/2021  ? Procedure: CYSTOSCOPY WITH RETROGRADE PYELOGRAM/URETERAL STENT PLACEMENT;  Surgeon: Raynelle Bring, MD;  Location: [facility];  Service: Urology;  Laterality: Left;  ? CYSTOSCOPY/URETEROSCOPY/HOLMIUM LASER/STENT PLACEMENT Left 02/26/2022  ? Procedure: CYSTOSCOPY/URETEROSCOPY/HOLMIUM LASER/STENT PLACEMENT;  Surgeon: Raynelle Bring, MD;  Location: [facility];  Service: Urology;  Laterality: Left;  ? ?HPI:  ?88 y.o. female with medical history significant for COPD, blindness, GERD and renal stones. Patient was brought to the hospital by her family due to worsening confusion. She was admitted to the hospital on 02/26/22 for left ureteroscopic laser lithotripsy and stent placement by Dr Alinda Money. The day before admission the stent was removed. Patient was noted to have worsening mentation since the stent removal procedure, patient confused, disorientated and agitated. Not able to sleep for 24 hrs and poor oral intake.  ?  ?Assessment / Plan / Recommendation  ?Clinical Impression ? Clinical swallowing evaluation completed while Pt was sitting upright in bed; Pt's daughter present for evaluation. Daughter had just completed oral care and provided Pt with dentures when SLP entered room (top and bottom) - Pt does not like to be without her  dentures. Pt consumed regular and soft textures and thin liquids without overt s/sx of aspiration. Pt and daughter deny swallowing difficulty, however they do report that corn bread can be problematic. SLP reviewed universal aspiration precautions and provided education that dry textures should be avoided or moistened. Recommend continue with D3/mech soft diet and thin liquids. There are no further ST needs noted at this time. ST will sign off. Thank you. ?SLP Visit Diagnosis: Dysphagia, unspecified (R13.10) ?   ?Aspiration Risk ? Mild aspiration risk  ?  ?Diet Recommendation Dysphagia 3 (Mech soft);Thin liquid  ? ?Liquid Administration via: Cup;Straw ?Medication Administration: Whole meds with liquid ?Supervision: Patient able to self feed;Staff to assist with self feeding;Full supervision/cueing for compensatory strategies ?Compensations: Minimize environmental distractions;Slow rate;Small sips/bites  ?  ?Other  Recommendations Oral Care Recommendations: Oral care BID   ? ?Recommendations for follow up therapy are one component of a multi-disciplinary discharge planning process, led by the attending physician.  Recommendations may be updated based on patient status, additional functional criteria and insurance authorization. ? ?Follow up Recommendations No SLP follow up  ? ? ?  ? ? ?Swallow Study   ?General Date of Onset: 03/12/22 ?HPI: 88 y.o. female with medical history significant for COPD, blindness, GERD and renal stones. Patient was brought to the hospital by her family due to worsening confusion. She was admitted to the hospital on 02/26/22 for left ureteroscopic laser lithotripsy and stent placement by Dr Alinda Money. The day before admission the stent was removed. Patient was noted to have worsening mentation since the stent removal procedure, patient confused, disorientated and agitated. Not able to sleep  for 24 hrs and poor oral intake. ?Type of Study: Bedside Swallow Evaluation ?Previous Swallow Assessment:  none in chart ?Diet Prior to this Study: Dysphagia 3 (soft);Thin liquids ?Temperature Spikes Noted: No ?Respiratory Status: Nasal cannula ?History of Recent Intubation: No ?Behavior/Cognition: Alert;Cooperative;Pleasant mood ?Oral Cavity Assessment: Within Functional Limits ?Oral Care Completed by SLP: Recent completion by staff ?Oral Cavity - Dentition: Dentures, top;Dentures, bottom ?Vision: Functional for self-feeding ?Self-Feeding Abilities: Able to feed self;Needs assist;Needs set up ?Patient Positioning: Upright in bed ?Baseline Vocal Quality: Normal ?Volitional Cough: Strong ?Volitional Swallow: Able to elicit  ?  ?Oral/Motor/Sensory Function Overall Oral Motor/Sensory Function: Within functional limits   ?Ice Chips Ice chips: Within functional limits   ?Thin Liquid Thin Liquid: Within functional limits  ?  ?Nectar Thick Nectar Thick Liquid: Not tested   ?Honey Thick Honey Thick Liquid: Not tested   ?Puree Puree: Not tested   ?Solid ? ? ?  Solid: Within functional limits  ? ?  ?Grier Vu H. Izora Ribas MA, CCC-SLP ?Speech Language Pathologist ? ?Wende Bushy ?03/13/2022,9:17 AM ? ? ? ?

## 2022-03-13 NOTE — Progress Notes (Signed)
?   03/13/22 1209  ?Readmission Prevention Plan - High Risk  ?Transportation Screening Complete  ?Home Care Consult (High Risk) Complete  ?High Risk Social Work Consult for recovery care planning/counseling (includes patient and caregiver) Complete  ?High Risk Palliative Care Screening Not Applicable  ?Medication Review Complete  ? ? ?

## 2022-03-13 NOTE — TOC Progression Note (Signed)
Transition of Care (TOC) - Progression Note  ? ? ?Patient Details  ?Name: [name] ?MRN: [id number] ?Date of Birth: 07-Jan-1926 ? ?Transition of Care (TOC) CM/SW Contact  ?Armanda Heritage, RN ?Phone Number: ?03/13/2022, 1:54 PM ? ?Clinical Narrative:    ?CM followed up with daughter regarding PT recommendation for SNF for short term rehab.  Daughter remains in agreement, FL2 faxed out to area SNF for bed offers.  ? ? ?Expected Discharge Plan: Skilled Nursing Facility ?Barriers to Discharge: Continued Medical Work up ? ?Expected Discharge Plan and Services ?Expected Discharge Plan: Skilled Nursing Facility ?  ?Discharge Planning Services: CM Consult ?Post Acute Care Choice: Skilled Nursing Facility ?Living arrangements for the past 2 months: Single Family Home ?                ?  ?  ?  ?  ?  ?  ?  ?  ?  ?  ? ? ?Social Determinants of Health (SDOH) Interventions ?  ? ?Readmission Risk Interventions ? ?  03/13/2022  ? 12:09 PM 08/19/2020  ? 11:50 AM  ?Readmission Risk Prevention Plan  ?Post Dischage Appt  Complete  ?Medication Screening  Complete  ?Transportation Screening Complete Complete  ?[place] or Home Care Consult Complete   ?Social Work Consult for Recovery Care Planning/Counseling Complete   ?Palliative Care Screening Not Applicable   ?Medication Review Oceanographer) Complete   ? ? ?

## 2022-03-13 NOTE — NC FL2 (Signed)
?[place] MEDICAID FL2 LEVEL OF CARE SCREENING TOOL  ?  ? ?IDENTIFICATION  ?Patient Name: ?[name] Birthdate: January 01, 1926 Sex: female Admission Date (Current Location): ?03/11/2022  ?[place] and [place] Number: ? [place] and Address:  ?[place] [address] [place] ?     Provider Number: ?[id number]  ?Attending Physician Name and Address:  ?Bonnielee Haff, MD ? Relative Name and Phone Number:  ?  ?   ?Current Level of Care: ?Hospital Recommended Level of Care: ?[place] Prior Approval Number: ?  ? ?Date Approved/Denied: ?  PASRR Number: ?[id number] ? ?Discharge Plan: ?SNF ?  ? ?Current Diagnoses: ?Patient Active Problem List  ? Diagnosis Date Noted  ? Hydronephrosis 03/12/2022  ? Acute metabolic encephalopathy A999333  ? Hypertension   ? COPD (chronic obstructive pulmonary disease) ([place])   ? Blind   ? Ureteral calculus 02/26/2022  ? Acute diastolic CHF (congestive heart failure) ([place]) 12/01/2021  ? Decubitus ulcer of coccyx, stage 2 ([place]) 11/30/2021  ? Decubitus ulcer of lower back, stage 1 11/30/2021  ? Hydronephrosis of left kidney 11/30/2021  ? Left nephrolithiasis 11/29/2021  ? Leukocytosis 11/29/2021  ? Normocytic anemia 11/29/2021  ? Essential hypertension 11/29/2021  ? GERD (gastroesophageal reflux disease) 11/29/2021  ? Glaucoma 11/29/2021  ? Dehydration 11/29/2021  ? Atrial fibrillation, chronic ([place]) 11/29/2021  ? Acute lower UTI 08/19/2020  ? Pressure injury of skin 08/16/2020  ? Cough   ? ? ?Orientation RESPIRATION BLADDER Height & Weight   ?  ?Self ? Normal Incontinent Weight:   ?Height:     ?BEHAVIORAL SYMPTOMS/MOOD NEUROLOGICAL BOWEL NUTRITION STATUS  ?    Continent Diet  ?AMBULATORY STATUS COMMUNICATION OF NEEDS Skin   ?Extensive Assist Verbally PU Stage and Appropriate Care ?PU Stage 1 Dressing: No Dressing (non blachable redness due to spine curvature) ?  ?  ?    ?     ?     ? ? ?Personal Care Assistance Level of Assistance   ?Bathing, Feeding, Dressing Bathing Assistance: Maximum assistance ?Feeding assistance: Maximum assistance ?Dressing Assistance: Maximum assistance ?   ? ?Functional Limitations Info  ?Sight, Hearing, Speech Sight Info: Impaired ?Hearing Info: Adequate ?Speech Info: Adequate  ? ? ?SPECIAL CARE FACTORS FREQUENCY  ?PT (By licensed PT), OT (By licensed OT)   ?  ?PT Frequency: 5x weekly ?OT Frequency: 5x weekly ?  ?  ?  ?   ? ? ?Contractures Contractures Info: Not present  ? ? ?Additional Factors Info  ?Code Status Code Status Info: Full ?  ?  ?  ?  ?   ? ?Current Medications (03/13/2022):  This is the current hospital active medication list ?Current Facility-Administered Medications  ?Medication Dose Route Frequency Provider Last Rate Last Admin  ? acetaminophen (TYLENOL) tablet 650 mg  650 mg Oral QHS Cleon Gustin, MD   650 mg at 03/12/22 2136  ? aspirin EC tablet 81 mg  81 mg Oral Daily Cleon Gustin, MD   81 mg at 03/13/22 1024  ? brimonidine (ALPHAGAN) 0.15 % ophthalmic solution 1 drop  1 drop Both Eyes BID Cleon Gustin, MD   1 drop at 03/13/22 1028  ? cefTRIAXone (ROCEPHIN) 1 g in sodium chloride 0.9 % 100 mL IVPB  1 g Intravenous Q24H Cleon Gustin, MD 200 mL/hr at 03/12/22 2140 1 g at 03/12/22 2140  ? [START ON 04/07/2022] cyanocobalamin ((VITAMIN B-12)) injection 1,000 mcg  1,000 mcg Intramuscular Q30 days  McKenzie, Candee Furbish, MD      ? diltiazem (CARDIZEM CD) 24 hr capsule 180 mg  180 mg Oral Daily Cleon Gustin, MD   180 mg at 03/13/22 1025  ? dorzolamide-timolol (COSOPT) 22.3-6.8 MG/ML ophthalmic solution 1 drop  1 drop Both Eyes BID Cleon Gustin, MD   1 drop at 03/13/22 1026  ? enoxaparin (LOVENOX) injection 30 mg  30 mg Subcutaneous Q24H Cleon Gustin, MD   30 mg at 03/13/22 1025  ? guaiFENesin (MUCINEX) 12 hr tablet 600 mg  600 mg Oral Daily PRN Cleon Gustin, MD      ? haloperidol (HALDOL) tablet 0.5 mg  0.5 mg Oral Q6H PRN McKenzie, Candee Furbish, MD      ?  Or  ? haloperidol lactate (HALDOL) injection 0.5 mg  0.5 mg Intramuscular Q6H PRN Cleon Gustin, MD      ? magnesium sulfate IVPB 2 g 50 mL  2 g Intravenous Once Bonnielee Haff, MD 50 mL/hr at 03/13/22 1308 2 g at 03/13/22 1308  ? melatonin tablet 6 mg  6 mg Oral QHS Cleon Gustin, MD   6 mg at 03/12/22 2136  ? morphine (PF) 2 MG/ML injection 1 mg  1 mg Intravenous Q4H PRN Cleon Gustin, MD   1 mg at 03/13/22 1043  ? multivitamin with minerals tablet 1 tablet  1 tablet Oral Daily Bonnielee Haff, MD   1 tablet at 03/13/22 1023  ? mupirocin cream (BACTROBAN) 2 %   Topical BID Tawni Millers, MD   Given at 03/13/22 1314  ? nystatin (MYCOSTATIN) 100000 UNIT/ML suspension 500,000-1,000,000 Units  5-10 mL Oral QID Cleon Gustin, MD   500,000 Units at 03/13/22 1023  ? ondansetron (ZOFRAN) tablet 4 mg  4 mg Oral Q6H PRN Cleon Gustin, MD      ? Or  ? ondansetron (ZOFRAN) injection 4 mg  4 mg Intravenous Q6H PRN McKenzie, Candee Furbish, MD      ? pantoprazole (PROTONIX) EC tablet 40 mg  40 mg Oral Daily Cleon Gustin, MD   40 mg at 03/13/22 1024  ? polyvinyl alcohol (LIQUIFILM TEARS) 1.4 % ophthalmic solution 1 drop  1 drop Both Eyes Q6H PRN Cleon Gustin, MD   1 drop at 03/12/22 2138  ? tobramycin-dexamethasone (TOBRADEX) ophthalmic suspension 1 drop  1 drop Both Eyes Q6H McKenzie, Candee Furbish, MD   1 drop at 03/13/22 1030  ? ? ? ?Discharge Medications: ?Please see discharge summary for a list of discharge medications. ? ?Relevant Imaging Results: ? ?Relevant Lab Results: ? ? ?Additional Information ?SSN [id number], covid vaccines 12/06/19, 01/03/20, 01/23/21 ? ?Joaquin Courts, RN ? ? ? ? ?

## 2022-03-13 NOTE — TOC Initial Note (Signed)
Transition of Care (TOC) - Initial/Assessment Note  ? ? ?Patient Details  ?Name: [name] ?MRN: [id number] ?Date of Birth: 01-Oct-1926 ? ?Transition of Care [facility]) CM/SW Contact:    ?Joaquin Courts, RN ?Phone Number: ?03/13/2022, 12:04 PM ? ?Clinical Narrative:                 ?CM noted patient is disoriented this admission.  CM reached out to daughter to discuss discharge planning.  Per Daughter patient lived with her prior to admission but has become increasingly weaker.  Daughter reports patient used to ambulate with [place] but in the last month has been unable to walk more than a couple of steps and now requires assist for transfers.  Daughter expresses interest in short term rehab with the possibility of patient remaining long term in a facility.  CM discussed the need for a long term payor source if this is the case, patient currently has [facility]. CM outreached to MD and shared this information and requested a PT/OT eval for discharge planning.  TOC will continue to follow for recommendations.  ? ?Expected Discharge Plan: [place] ?Barriers to Discharge: Continued Medical Work up ? ? ?Patient Goals and CMS Choice ?Patient states their goals for this hospitalization and ongoing recovery are:: to go to rehab and possibly stay in a nursing home ?CMS Medicare.gov Compare Post Acute Care list provided to:: Patient Represenative (must comment) ?Choice offered to / list presented to : Adult Children ? ?Expected Discharge Plan and Services ?Expected Discharge Plan: [place] ?  ?Discharge Planning Services: CM Consult ?Post Acute Care Choice: [place] ?Living arrangements for the past 2 months: [place] ?                ?  ?  ?  ?  ?  ?  ?  ?  ?  ?  ? ?Prior Living Arrangements/Services ?Living arrangements for the past 2 months: [place] ?Lives with:: Adult Children ?Patient language and need for interpreter reviewed:: Yes ?Do you feel  safe going back to the place where you live?: No   daughter reports patient is too [place] for her to safely care for her in the home  ?Need for Family Participation in Patient Care: Yes (Comment) ?Care giver support system in place?: Yes (comment) ?  ?Criminal Activity/Legal Involvement Pertinent to Current Situation/Hospitalization: No - Comment as needed ? ?Activities of Daily Living ?  ?ADL Screening (condition at time of admission) ?Patient's cognitive ability adequate to safely complete daily activities?: No ?Is the patient deaf or have difficulty hearing?: Yes ?Does the patient have difficulty seeing, even when wearing glasses/contacts?: Yes ?Does the patient have difficulty concentrating, remembering, or making decisions?: Yes ?Patient able to express need for assistance with ADLs?: Yes ?Does the patient have difficulty dressing or bathing?: Yes ?Independently performs ADLs?: No ?Does the patient have difficulty walking or climbing stairs?: Yes ?Weakness of Legs: Both ?Weakness of Arms/Hands: Both ? ?Permission Sought/Granted ?  ?  ?   ?   ?   ?   ? ?Emotional Assessment ?Appearance:: Appears stated age ?Attitude/Demeanor/Rapport: Engaged ?Affect (typically observed): Accepting ?Orientation: : Fluctuating Orientation (Suspected and/or reported [place]) ?  ?Psych Involvement: No (comment) ? ?Admission diagnosis:  Hydronephrosis [N13.30] ?Kidney stones [N20.0] ?Urinary tract infection with hematuria, site unspecified [N39.0, R31.9] ?Patient Active Problem List  ? Diagnosis Date Noted  ? Hydronephrosis 03/12/2022  ? Acute metabolic encephalopathy A999333  ? Hypertension   ?  COPD (chronic obstructive pulmonary disease) ([place])   ? Blind   ? Ureteral calculus 02/26/2022  ? Acute diastolic CHF (congestive heart failure) ([place]) 12/01/2021  ? Decubitus ulcer of coccyx, stage 2 ([place]) 11/30/2021  ? Decubitus ulcer of lower back, stage 1 11/30/2021  ? Hydronephrosis of left kidney 11/30/2021  ? Left nephrolithiasis  11/29/2021  ? Leukocytosis 11/29/2021  ? Normocytic anemia 11/29/2021  ? Essential hypertension 11/29/2021  ? GERD (gastroesophageal reflux disease) 11/29/2021  ? Glaucoma 11/29/2021  ? Dehydration 11/29/2021  ? Atrial fibrillation, chronic ([place]) 11/29/2021  ? Acute lower UTI 08/19/2020  ? Pressure injury of skin 08/16/2020  ? Cough   ? ?PCP:  Sharilyn Sites, MD ?Pharmacy:   ?[place] ?[place]  [address] ?Phone: [phone] Fax: [phone] ? ? ? ? ?Social Determinants of Health (SDOH) Interventions ?  ? ?Readmission Risk Interventions ? ?  08/19/2020  ? 11:50 AM  ?Readmission Risk Prevention Plan  ?Post Dischage Appt Complete  ?Medication Screening Complete  ?Transportation Screening Complete  ? ? ? ?

## 2022-03-13 NOTE — Progress Notes (Signed)
? ?[facility] ?PROGRESS NOTE ? ? ?AUTHERINE NITKA [id number] DOB: October 03, 1926 DOA: 03/11/2022 ? ?PCP: Sharilyn Sites, MD ? ?Brief History/Interval Summary: 88 y.o. female with medical history significant for COPD, blindness, GERD and renal stones. Patient was brought to the hospital by her family due to worsening confusion. She was admitted to the hospital on 02/26/22 for left ureteroscopic laser lithotripsy and stent placement by Dr Alinda Money. The day before admission the stent was removed. Patient was noted to have worsening mentation since the stent removal procedure, patient confused, disorientated and agitated. Not able to sleep for 24 hrs and poor oral intake.  ? ?Consultants: Urology  ? ?Procedures:  ?On 03/12/22: ?1 cystoscopy ?2. Left retrograde pyelography ?3.  Intraoperative fluoroscopy, under one hour, with interpretation ?4.  Left ureteroscopic stone manipulation with basket extraction ?5.  Left 6 x 26 JJ stent placement ? ? ? ?Subjective/Interval History: ?Patient noted to be more awake and alert this morning.  Still quite fatigued.  Answering my questions appropriately.  She is hard of hearing.  Denies any shortness of breath or chest pain.  Does complain of pain in the left back  ? ? ? ?Assessment/Plan: ? ?Acute metabolic encephalopathy ?This was likely due to acute illness.  No focal neurological deficits was noted.  UTI likely contributed as well.   ?CT scan of the head did not show any acute findings. ?Seems to be much more awake and appropriate this morning compared to yesterday.   ?Continue antibiotics.  PT and OT evaluation.   ? ?Left-sided hydronephrosis/urinary tract infection ?Likely secondary to stone burden.   ?Seen by urology.  Underwent stone extraction and stent placement on 5/11. ?She was hospitalized in April for left ureteroscopic laser lithotripsy and stent placement by Dr. Alinda Money.  Looks like the stent was removed at some point in time as the CT scan done at the time of  admission did not show any stent in place.   ?A new stent was placed on 5/11 as mentioned above. ?Patient on ceftriaxone.  Urine culture growing Staph aureus.  Wait for final identification and sensitivities. ?Defer further management of the hydronephrosis to urology. ? ?Essential hypertension ?Continue diltiazem.  Blood pressure appears to be reasonably well controlled. ? ?Hypokalemia/normal anion gap metabolic acidosis ?Potassium has improved.  Magnesium 1.7.  Metabolic acidosis is improving.  All of this likely from hypovolemia.  Cut back on IV fluids ? ?Normocytic anemia ?Hemoglobin is stable.  No evidence of overt bleeding. ? ?COPD ?Respiratory status noted to be stable.  Continue to monitor ? ?Blindness ?Continue with eyedrops ? ? ?DVT Prophylaxis: Lovenox ?Code Status: Full code ?Family Communication: No family at bedside ?Disposition Plan: To be determined.  Will involve PT and OT. ? ?Status is: Inpatient ?Remains inpatient appropriate because: Left-sided hydronephrosis, urinary tract infection, acute metabolic encephalopathy ? ? ? ?Medications: Scheduled: ? acetaminophen  650 mg Oral QHS  ? aspirin EC  81 mg Oral Daily  ? brimonidine  1 drop Both Eyes BID  ? [START ON 04/07/2022] cyanocobalamin  1,000 mcg Intramuscular Q30 days  ? diltiazem  180 mg Oral Daily  ? dorzolamide-timolol  1 drop Both Eyes BID  ? enoxaparin (LOVENOX) injection  30 mg Subcutaneous Q24H  ? melatonin  6 mg Oral QHS  ? multivitamin with minerals  1 tablet Oral Daily  ? mupirocin cream   Topical BID  ? nystatin  5-10 mL Oral QID  ? pantoprazole  40 mg Oral Daily  ? tobramycin-dexamethasone  1  drop Both Eyes Q6H  ? ?Continuous: ? cefTRIAXone (ROCEPHIN)  IV 1 g (03/12/22 2140)  ? ?[id number], haloperidol **OR** haloperidol lactate, morphine injection, ondansetron **OR** ondansetron (ZOFRAN) IV, polyvinyl alcohol ? ?Antibiotics: ?Anti-infectives (From admission, onward)  ? ? Start     Dose/Rate Route Frequency Ordered Stop  ? 03/12/22  2200  cefTRIAXone (ROCEPHIN) 1 g in sodium chloride 0.9 % 100 mL IVPB       ? 1 g ?200 mL/hr over 30 Minutes Intravenous Every 24 hours 03/12/22 0149    ? 03/12/22 1000  levofloxacin (LEVAQUIN) tablet 250 mg  Status:  Discontinued       ? 250 mg Oral Daily 03/12/22 0147 03/12/22 0207  ? 03/11/22 2245  cefTRIAXone (ROCEPHIN) 1 g in sodium chloride 0.9 % 100 mL IVPB       ? 1 g ?200 mL/hr over 30 Minutes Intravenous  Once 03/11/22 2239 03/12/22 0014  ? ?  ? ? ?Objective: ? ?Vital Signs ? ?Vitals:  ? 03/12/22 1345 03/12/22 1452 03/12/22 2233 03/13/22 0445  ?BP: 137/74 127/82 127/81 (!) 138/91  ?Pulse:  84 98 99  ?Resp: 20 16 18 18   ?Temp:  98.4 ?F (36.9 ?C) 98.3 ?F (36.8 ?C) 98.6 ?F (37 ?C)  ?TempSrc:  Oral    ?SpO2: 95% 95% 100% 98%  ? ? ?Intake/Output Summary (Last 24 hours) at 03/13/2022 1059 ?Last data filed at 03/13/2022 0500 ?Gross per 24 hour  ?Intake 1708.75 ml  ?Output 752 ml  ?Net 956.75 ml  ? ? ?There were no vitals filed for this visit. ? ?General appearance: In no distress.  Mildly distracted but much more awake and alert compared to yesterday. ?Resp: Clear to auscultation bilaterally.  Normal effort ?Cardio: S1-S2 is normal regular.  No S3-S4.  No rubs murmurs or bruit ?GI: Abdomen is soft.  Nontender nondistended.  Bowel sounds are present normal.  No masses organomegaly ?Extremities: No edema.  Able to move all 4 extremities.  Physical deconditioning is noted ?Neurologic:  No focal neurological deficits.  ? ? ? ?Lab Results: ? ?Data Reviewed: I have personally reviewed following labs and reports of the imaging studies ? ?CBC: ?Recent Labs  ?Lab 03/11/22 ?1931 03/12/22 ?0430 03/13/22 ?[id number]  ?WBC 10.9* 13.1* 11.0*  ?NEUTROABS 7.9*  --   --   ?HGB 9.6* 10.1* 9.7*  ?HCT 31.1* 31.5* 31.0*  ?MCV 78.1* 80.6 79.1*  ?PLT 539* 601* 503*  ? ? ? ?Basic Metabolic Panel: ?Recent Labs  ?Lab 03/11/22 ?1931 03/12/22 ?0430 03/13/22 ?[id number]  ?NA 136 137 138  ?K 3.0* 3.1* 3.8  ?CL 109 113* 113*  ?CO2 18* 16* 21*  ?GLUCOSE 90  105* 114*  ?BUN 14 11 7*  ?CREATININE 0.76 0.76 0.64  ?CALCIUM 7.9* 7.9* 8.0*  ?MG  --  1.7 1.7  ? ? ? ?GFR: ?Estimated Creatinine Clearance: 25.5 mL/min (by C-G formula based on SCr of 0.64 mg/dL). ? ?Liver Function Tests: ?Recent Labs  ?Lab 03/11/22 ?1931  ?AST 16  ?ALT 9  ?ALKPHOS 68  ?BILITOT 0.6  ?PROT 6.5  ?ALBUMIN 2.7*  ? ? ? ? ? ?Recent Results (from the past 240 hour(s))  ?Resp Panel by RT-PCR (Flu A&B, Covid) Nasopharyngeal Swab     Status: None  ? Collection Time: 03/11/22  7:31 PM  ? Specimen: Nasopharyngeal Swab; Nasopharyngeal(NP) swabs in vial transport medium  ?Result Value Ref Range Status  ? SARS Coronavirus 2 by RT PCR NEGATIVE NEGATIVE Final  ?  Comment: (NOTE) ?SARS-CoV-2 target nucleic  acids are NOT DETECTED. ? ?The SARS-CoV-2 RNA is generally detectable in upper respiratory ?specimens during the acute phase of infection. The lowest ?concentration of SARS-CoV-2 viral copies this assay can detect is ?138 copies/mL. A negative result does not preclude SARS-Cov-2 ?infection and should not be used as the sole basis for treatment or ?other patient management decisions. A negative result may occur with  ?improper specimen collection/handling, submission of specimen other ?than nasopharyngeal swab, presence of viral mutation(s) within the ?areas targeted by this assay, and inadequate number of viral ?copies(<138 copies/mL). A negative result must be combined with ?clinical observations, patient history, and epidemiological ?information. The expected result is Negative. ? ?Fact Sheet for Patients:  ?EntrepreneurPulse.com.au ? ?Fact Sheet for Healthcare Providers:  ?IncredibleEmployment.be ? ?This test is no t yet approved or cleared by the [place] FDA and  ?has been authorized for detection and/or diagnosis of SARS-CoV-2 by ?FDA under an Emergency Use Authorization (EUA). This EUA will remain  ?in effect (meaning this test can be used) for the duration of  the ?COVID-19 declaration under Section 564(b)(1) of the Act, 21 ?U.S.C.section 360bbb-3(b)(1), unless the authorization is terminated  ?or revoked sooner.  ? ? ?  ? Influenza A by PCR NEGATIVE NEGATIVE Final  ? Inf

## 2022-03-13 NOTE — Evaluation (Signed)
Physical Therapy Evaluation ?Patient Details ?Name: [name] ?MRN: [id number] ?DOB: 10-03-1926 ?Today's Date: 03/13/2022 ? ?History of Present Illness ? [name] is a 88 y.o. female with medical history significant of COPD, blindness, GERD and renal stones. Patient was brought to the hospital by her family due to worsening confusion. She was admitted to the hospital on 02/26/22 for left ureteroscopic laser lithotripsy and stent placement by Dr Alinda Money. The day before admission the stent was removed. Patient was noted to have worsening mentation since the stent removal procedure, patient confused, disorientated and agitated. Not able to sleep for 24 hrs and poor oral intake. Information obtained by her family at the bedside, patient not able to provide any history due to cognitive impairment. ?  ?Clinical Impression ? Patient limited for functional mobility as stated below secondary to BLE weakness, fatigue, pain and impaired balance. Patient requires mod/max assist to transition to seated EOB but is limited to fully upright trunk by c/o back/R flank pain. She attempts again but c/o severe pain and she begins pushing into trunk extension to avoid sitting any longer. She requires max assist to reposition and scoot up in bed. Patient will benefit from continued physical therapy in hospital and recommended venue below to increase strength, balance, endurance for safe ADLs and gait. ?   ?   ? ?Recommendations for follow up therapy are one component of a multi-disciplinary discharge planning process, led by the attending physician.  Recommendations may be updated based on patient status, additional functional criteria and insurance authorization. ? ?Follow Up Recommendations Skilled nursing-short term rehab (<3 hours/day) ? ?  ?Assistance Recommended at Discharge    ?Patient can return home with the following ? A lot of help with walking and/or transfers;A lot of help with bathing/dressing/bathroom;Assistance with  cooking/housework;Assist for transportation;Help with stairs or ramp for entrance;Assistance with feeding ? ?  ?Equipment Recommendations None recommended by PT  ?Recommendations for Other Services ?    ?  ?Functional Status Assessment Patient has had a recent decline in their functional status and demonstrates the ability to make significant improvements in function in a reasonable and predictable amount of time.  ? ?  ?Precautions / Restrictions Precautions ?Precautions: Fall ?Restrictions ?Weight Bearing Restrictions: No  ? ?  ? ?Mobility ? Bed Mobility ?Overal bed mobility: Needs Assistance ?Bed Mobility: Supine to Sit, Sit to Supine ?  ?  ?Supine to sit: Mod assist, Max assist, HOB elevated ?Sit to supine: Mod assist, Max assist ?  ?  ?  ? ?Transfers ?  ?  ?  ?  ?  ?  ?  ?  ?  ?  ?  ? ?Ambulation/Gait ?  ?  ?  ?  ?  ?  ?  ?  ? ?Stairs ?  ?  ?  ?  ?  ? ?Wheelchair Mobility ?  ? ?Modified Rankin (Stroke Patients Only) ?  ? ?  ? ?Balance   ?  ?  ?  ?  ?  ?  ?  ?  ?  ?  ?  ?  ?  ?  ?  ?  ?  ?  ?   ? ? ? ?Pertinent Vitals/Pain Pain Assessment ?Pain Assessment: Faces ?Faces Pain Scale: Hurts even more ?Pain Location: back ?Pain Descriptors / Indicators: Stabbing ?Pain Intervention(s): Limited activity within patient's tolerance, Monitored during session, Repositioned, Premedicated before session  ? ? ?Home Living Family/patient expects to be discharged to:: Private residence ?Living Arrangements: Children ?  Available Help at Discharge: Family ?Type of Home: House ?Home Access: Stairs to enter ?Entrance Stairs-Rails: Right;Left ?Entrance Stairs-Number of Steps: 2 ?  ?Home Layout: One level ?Home Equipment: Conservation officer, nature (2 wheels);Wheelchair - manual;BSC/3in1 ?   ?  ?Prior Function Prior Level of Function : Needs assist ?  ?  ?  ?  ?  ?  ?Mobility Comments: Patient was household ambulator with recent decline over last few weeks ?ADLs Comments: family assists ?  ? ? ?Hand Dominance  ?   ? ?  ?Extremity/Trunk Assessment   ? Upper Extremity Assessment ?Upper Extremity Assessment: Defer to OT evaluation ?  ? ?Lower Extremity Assessment ?Lower Extremity Assessment: Generalized weakness ?  ? ?Cervical / Trunk Assessment ?Cervical / Trunk Assessment: Kyphotic  ?Communication  ? Communication: HOH  ?Cognition Arousal/Alertness: Awake/alert ?Behavior During Therapy: [facility] for tasks assessed/performed ?Overall Cognitive Status: Within Functional Limits for tasks assessed ?  ?  ?  ?  ?  ?  ?  ?  ?  ?  ?  ?  ?  ?  ?  ?  ?  ?  ?  ? ?  ?General Comments   ? ?  ?Exercises    ? ?Assessment/Plan  ?  ?PT Assessment Patient needs continued PT services  ?PT Problem List Decreased strength;Decreased mobility;Decreased range of motion;Decreased activity tolerance;Decreased balance;Pain ? ?   ?  ?PT Treatment Interventions DME instruction;Therapeutic exercise;Stair training;Gait training;Balance training;Neuromuscular re-education;Functional mobility training;Patient/family education;Therapeutic activities   ? ?PT Goals (Current goals can be found in the Care Plan section)  ?Acute Rehab PT Goals ?Patient Stated Goal: feel better ?PT Goal Formulation: With patient ?Time For Goal Achievement: 03/27/22 ?Potential to Achieve Goals: Fair ? ?  ?Frequency Min 3X/week ?  ? ? ?Co-evaluation   ?  ?  ?  ?  ? ? ?  ?AM-PAC PT "6 Clicks" Mobility  ?Outcome Measure Help needed turning from your back to your side while in a flat bed without using bedrails?: A Lot ?Help needed moving from lying on your back to sitting on the side of a flat bed without using bedrails?: A Lot ?Help needed moving to and from a bed to a chair (including a wheelchair)?: A Lot ?Help needed standing up from a chair using your arms (e.g., wheelchair or bedside chair)?: A Lot ?Help needed to walk in hospital room?: Total ?Help needed climbing 3-5 steps with a railing? : Total ?6 Click Score: 10 ? ?  ?End of Session   ?Activity Tolerance: Patient limited by pain;Patient limited by fatigue ?Patient  left: in bed;with nursing/sitter in room;with family/visitor present;with call bell/phone within reach ?Nurse Communication: Mobility status ?PT Visit Diagnosis: Unsteadiness on feet (R26.81);Other abnormalities of gait and mobility (R26.89);Muscle weakness (generalized) (M62.81) ?  ? ?Time: [id number] ?PT Time Calculation (min) (ACUTE ONLY): 21 min ? ? ?Charges:   PT Evaluation ?$PT Eval Low Complexity: 1 Low ?PT Treatments ?$Therapeutic Activity: 8-22 mins ?  ?   ? ? ?2:19 PM, 03/13/22 ?Mearl Latin PT, DPT ?Physical Therapist at [facility] ?[facility] ? ? ?

## 2022-03-13 NOTE — Care Management Important Message (Signed)
Important Message ? ?Patient Details  ?Name: [name] ?MRN: [id number] ?Date of Birth: [birth year] ? ? ?Medicare Important Message Given:  Yes ? ? ? ? ?Corey Harold ?03/13/2022, 12:19 PM ?

## 2022-03-13 NOTE — Plan of Care (Signed)
?  Problem: Acute Rehab PT Goals(only PT should resolve) ?Goal: Pt Will Go Supine/Side To Sit ?Outcome: Progressing ?Flowsheets (Taken 03/13/2022 1421) ?Pt will go Supine/Side to Sit: with minimal assist ?Goal: Pt Will Go Sit To Supine/Side ?Outcome: Progressing ?Flowsheets (Taken 03/13/2022 1421) ?Pt will go Sit to Supine/Side: with minimal assist ?Goal: Patient Will Perform Sitting Balance ?Outcome: Progressing ?Flowsheets (Taken 03/13/2022 1421) ?Patient will perform sitting balance: ? with minimal assist ? with min guard assist ?Goal: Patient Will Transfer Sit To/From Stand ?Outcome: Progressing ?Flowsheets (Taken 03/13/2022 1421) ?Patient will transfer sit to/from stand: ? with minimal assist ? with moderate assist ?Goal: Pt Will Transfer Bed To Chair/Chair To Bed ?Outcome: Progressing ?Flowsheets (Taken 03/13/2022 1421) ?Pt will Transfer Bed to Chair/Chair to Bed: ? with min assist ? with mod assist ? 2:22 PM, 03/13/22 ?Wyman Songster PT, DPT ?Physical Therapist at [facility] ?[facility] ? ?

## 2022-03-14 LAB — URINE CULTURE: Culture: 30000 — AB

## 2022-03-14 LAB — BASIC METABOLIC PANEL
Anion gap: 3 — ABNORMAL LOW (ref 5–15)
BUN: 7 mg/dL — ABNORMAL LOW (ref 8–23)
CO2: 21 mmol/L — ABNORMAL LOW (ref 22–32)
Calcium: 7.8 mg/dL — ABNORMAL LOW (ref 8.9–10.3)
Chloride: 113 mmol/L — ABNORMAL HIGH (ref 98–111)
Creatinine, Ser: 0.67 mg/dL (ref 0.44–1.00)
GFR, Estimated: >60 mL/min (ref >60)
Glucose, Bld: 99 mg/dL (ref 70–99)
Potassium: 4.1 mmol/L (ref 3.5–5.1)
Sodium: 137 mmol/L (ref 135–145)

## 2022-03-14 LAB — CBC
HCT: 29.2 % — ABNORMAL LOW (ref 36.0–46.0)
Hemoglobin: 9.3 g/dL — ABNORMAL LOW (ref 12.0–15.0)
MCH: 25.2 pg — ABNORMAL LOW (ref 26.0–34.0)
MCHC: 31.8 g/dL (ref 30.0–36.0)
MCV: 79.1 fL — ABNORMAL LOW (ref 80.0–100.0)
Platelets: 476 10*3/uL — ABNORMAL HIGH (ref 150–400)
RBC: 3.69 MIL/uL — ABNORMAL LOW (ref 3.87–5.11)
RDW: 20.3 % — ABNORMAL HIGH (ref 11.5–15.5)
WBC: 9.7 10*3/uL (ref 4.0–10.5)
nRBC: 0 % (ref 0.0–0.2)

## 2022-03-14 MED ORDER — OXYCODONE HCL 5 MG PO TABS
5.0000 mg | ORAL_TABLET | Freq: Four times a day (QID) | ORAL | Status: DC | PRN
  Administered 2022-03-15: 5 mg via ORAL
  Filled 2022-03-14: qty 1

## 2022-03-14 MED ORDER — DALBAVANCIN HCL 500 MG IV SOLR
1125.0000 mg | Freq: Once | INTRAVENOUS | Status: AC
  Administered 2022-03-14: 1125 mg via INTRAVENOUS
  Filled 2022-03-14: qty 56.25

## 2022-03-14 MED ORDER — ENSURE ENLIVE PO LIQD: 237.0000 mL | Freq: Three times a day (TID) | ORAL | 12 refills | Status: AC

## 2022-03-14 MED ORDER — OXYCODONE-ACETAMINOPHEN 5-325 MG PO TABS: ORAL_TABLET | ORAL | 0 refills | Status: AC

## 2022-03-14 MED ORDER — ENSURE ENLIVE PO LIQD
237.0000 mL | Freq: Three times a day (TID) | ORAL | Status: DC
  Administered 2022-03-14 (×2): 237 mL via ORAL

## 2022-03-14 NOTE — TOC Progression Note (Signed)
Transition of Care (TOC) - Progression Note  ? ? ?Patient Details  ?Name: ANARA COWMAN ?MRN: [id number] ?Date of Birth: September 15, 1926 ? ?Transition of Care (TOC) CM/SW Contact  ?Roney Youtz D, LCSW ?Phone Number: ?03/14/2022, 12:46 PM ? ?Clinical Narrative:    ?Spoke with [employer] representative regarding auth. Auth remains pending as to this time. Advised that attending plans on d/c tomorrow. TOC will follow up tomorrow.  ? ? ?Expected Discharge Plan: Skilled Nursing Facility ?Barriers to Discharge: Continued Medical Work up ? ?Expected Discharge Plan and Services ?Expected Discharge Plan: Skilled Nursing Facility ?  ?Discharge Planning Services: CM Consult ?Post Acute Care Choice: Skilled Nursing Facility ?Living arrangements for the past 2 months: Single Family Home ?                ?  ?  ?  ?  ?  ?  ?  ?  ?  ?  ? ? ?Social Determinants of Health (SDOH) Interventions ?  ? ?Readmission Risk Interventions ? ?  03/13/2022  ? 12:09 PM 08/19/2020  ? 11:50 AM  ?Readmission Risk Prevention Plan  ?Post Dischage Appt  Complete  ?Medication Screening  Complete  ?Transportation Screening Complete Complete  ?HRI or Home Care Consult Complete   ?Social Work Consult for Recovery Care Planning/Counseling Complete   ?Palliative Care Screening Not Applicable   ?Medication Review Oceanographer) Complete   ? ? ?

## 2022-03-14 NOTE — Progress Notes (Addendum)
? ?[facility] ?PROGRESS NOTE ? ? ?[name] [id number] DOB: [birth year] DOA: 03/11/2022 ? ?PCP: Sharilyn Sites, MD ? ?Brief History/Interval Summary: 88 y.o. female with medical history significant for COPD, blindness, GERD and renal stones. Patient was brought to the hospital by her family due to worsening confusion. She was admitted to the hospital on 02/26/22 for left ureteroscopic laser lithotripsy and stent placement by Dr Alinda Money. The day before admission the stent was removed. Patient was noted to have worsening mentation since the stent removal procedure, patient confused, disorientated and agitated. Not able to sleep for 24 hrs and poor oral intake.  ? ?Consultants: Urology  ? ?Procedures:  ?On 03/12/22: ?1 cystoscopy ?2. Left retrograde pyelography ?3.  Intraoperative fluoroscopy, under one hour, with interpretation ?4.  Left ureteroscopic stone manipulation with basket extraction ?5.  Left 6 x 26 JJ stent placement ? ? ? ?Subjective/Interval History: ?Patient denies any complaints this morning.  She mentions that her back pain is much better.  Reports that she is feeling hungry this morning.  No shortness of breath or chest pain.  No nausea or vomiting.   ? ? ?Assessment/Plan: ? ?Acute metabolic encephalopathy ?This was likely due to acute illness.  No focal neurological deficits was noted.  UTI likely contributed as well.   ?CT scan of the head did not show any acute findings. ?Patient seems to have improved in the last 48 hours.  Could be back to baseline. ? ?Left-sided hydronephrosis/urinary tract infection ?Likely secondary to stone burden.   ?Seen by urology.  Underwent stone extraction and stent placement on 5/11. ?She was hospitalized in April for left ureteroscopic laser lithotripsy and stent placement by Dr. Alinda Money.  Looks like the stent was removed at some point in time as the CT scan done at the time of admission did not show any stent in place.   ?A new stent was placed on 5/11 as  mentioned above. ?Patient on ceftriaxone.   ?Urine culture positive for MRSA.  Likely due to recent instrumentation.  Sensitivities reviewed.  Patient with allergy to sulfa antibiotics and to doxycycline.  Will discuss with ID regarding appropriate treatment.  Blood cultures were negative. ?Defer further management of the hydronephrosis to urology. ? ?ADDENDUM: ?Discussed with Dr. Juleen China with ID. Dalbavancin is recommended which has been ordered. Daughter was updated as well. ? ?Essential hypertension ?Continue diltiazem.  Blood pressure appears to be reasonably well controlled. ? ?Hypokalemia/normal anion gap metabolic acidosis ?Potassium has improved.  Magnesium 1.7.  Metabolic acidosis is improving.  Likely from hypovolemia.  IV fluids can be discontinued once her oral intake has improved. ? ?Normocytic anemia ?Hemoglobin is stable.  No evidence of overt bleeding. ? ?COPD ?Respiratory status noted to be stable.  Continue to monitor ? ?Blindness ?Continue with eyedrops ? ?Anorexia ?Poor appetite noted over the last few days.  Today however she mentions that she is feeling hungry.  We will also order Ensure.  Encourage oral intake. ? ? ?DVT Prophylaxis: Lovenox ?Code Status: Full code ?Family Communication: No family at bedside.  Daughter was updated yesterday.  We will do so again today. ?Disposition Plan: SNF recommended by PT and OT.  Hopefully in the next 24 to 48 hours. ? ?Status is: Inpatient ?Remains inpatient appropriate because: Left-sided hydronephrosis, urinary tract infection, acute metabolic encephalopathy ? ? ? ?Medications: Scheduled: ? acetaminophen  650 mg Oral QHS  ? aspirin EC  81 mg Oral Daily  ? brimonidine  1 drop Both Eyes BID  ? [  START ON 04/07/2022] cyanocobalamin  1,000 mcg Intramuscular Q30 days  ? diltiazem  180 mg Oral Daily  ? dorzolamide-timolol  1 drop Both Eyes BID  ? enoxaparin (LOVENOX) injection  30 mg Subcutaneous Q24H  ? feeding supplement  237 mL Oral TID BM  ? melatonin  6 mg  Oral QHS  ? multivitamin with minerals  1 tablet Oral Daily  ? mupirocin cream   Topical BID  ? nystatin  5-10 mL Oral QID  ? pantoprazole  40 mg Oral Daily  ? tobramycin-dexamethasone  1 drop Both Eyes Q6H  ? ?Continuous: ? cefTRIAXone (ROCEPHIN)  IV 1 g (03/13/22 2045)  ? dextrose 5 % and 0.45% NaCl 50 mL/hr at 03/14/22 0431  ? ?[id number], haloperidol **OR** haloperidol lactate, morphine injection, ondansetron **OR** ondansetron (ZOFRAN) IV, polyvinyl alcohol ? ?Antibiotics: ?Anti-infectives (From admission, onward)  ? ? Start     Dose/Rate Route Frequency Ordered Stop  ? 03/12/22 2200  cefTRIAXone (ROCEPHIN) 1 g in sodium chloride 0.9 % 100 mL IVPB       ? 1 g ?200 mL/hr over 30 Minutes Intravenous Every 24 hours 03/12/22 0149    ? 03/12/22 1000  levofloxacin (LEVAQUIN) tablet 250 mg  Status:  Discontinued       ? 250 mg Oral Daily 03/12/22 0147 03/12/22 0207  ? 03/11/22 2245  cefTRIAXone (ROCEPHIN) 1 g in sodium chloride 0.9 % 100 mL IVPB       ? 1 g ?200 mL/hr over 30 Minutes Intravenous  Once 03/11/22 2239 03/12/22 0014  ? ?  ? ? ?Objective: ? ?Vital Signs ? ?Vitals:  ? 03/13/22 0445 03/13/22 1425 03/13/22 2009 03/14/22 0407  ?BP: (!) 138/91 107/66 135/65 133/69  ?Pulse: 99 84 86 78  ?Resp: 18 20 18 18   ?Temp: 98.6 ?F (37 ?C) 98 ?F (36.7 ?C) 98.2 ?F (36.8 ?C) 97.6 ?F (36.4 ?C)  ?TempSrc:  Oral    ?SpO2: 98% 98% 99% 98%  ? ? ?Intake/Output Summary (Last 24 hours) at 03/14/2022 1002 ?Last data filed at 03/14/2022 0900 ?Gross per 24 hour  ?Intake 1414.64 ml  ?Output 1050 ml  ?Net 364.64 ml  ? ? ?There were no vitals filed for this visit. ? ?General appearance: Awake alert.  In no distress ?Resp: Clear to auscultation bilaterally.  Normal effort ?Cardio: S1-S2 is normal regular.  No S3-S4.  No rubs murmurs or bruit ?GI: Abdomen is soft.  Nontender nondistended.  Bowel sounds are present normal.  No masses organomegaly ?Extremities: No edema.  Able to move all of her extremities.  Physical deconditioning is  noted. ?Neurologic:  No focal neurological deficits.  ? ? ? ? ?Lab Results: ? ?Data Reviewed: I have personally reviewed following labs and reports of the imaging studies ? ?CBC: ?Recent Labs  ?Lab 03/11/22 ?1931 03/12/22 ?0430 03/13/22 ?[id number] 03/14/22 ?0500  ?WBC 10.9* 13.1* 11.0* 9.7  ?NEUTROABS 7.9*  --   --   --   ?HGB 9.6* 10.1* 9.7* 9.3*  ?HCT 31.1* 31.5* 31.0* 29.2*  ?MCV 78.1* 80.6 79.1* 79.1*  ?PLT 539* 601* 503* 476*  ? ? ? ?Basic Metabolic Panel: ?Recent Labs  ?Lab 03/11/22 ?1931 03/12/22 ?0430 03/13/22 ?[id number] 03/14/22 ?0500  ?NA 136 137 138 137  ?K 3.0* 3.1* 3.8 4.1  ?CL 109 113* 113* 113*  ?CO2 18* 16* 21* 21*  ?GLUCOSE 90 105* 114* 99  ?BUN 14 11 7* 7*  ?CREATININE 0.76 0.76 0.64 0.67  ?CALCIUM 7.9* 7.9* 8.0* 7.8*  ?MG  --  1.7 1.7  --   ? ? ? ?GFR: ?Estimated Creatinine Clearance: 25.5 mL/min (by C-G formula based on SCr of 0.67 mg/dL). ? ?Liver Function Tests: ?Recent Labs  ?Lab 03/11/22 ?1931  ?AST 16  ?ALT 9  ?ALKPHOS 68  ?BILITOT 0.6  ?PROT 6.5  ?ALBUMIN 2.7*  ? ? ? ? ? ?Recent Results (from the past 240 hour(s))  ?Resp Panel by RT-PCR (Flu A&B, Covid) Nasopharyngeal Swab     Status: None  ? Collection Time: 03/11/22  7:31 PM  ? Specimen: Nasopharyngeal Swab; Nasopharyngeal(NP) swabs in vial transport medium  ?Result Value Ref Range Status  ? SARS Coronavirus 2 by RT PCR NEGATIVE NEGATIVE Final  ?  Comment: (NOTE) ?SARS-CoV-2 target nucleic acids are NOT DETECTED. ? ?The SARS-CoV-2 RNA is generally detectable in upper respiratory ?specimens during the acute phase of infection. The lowest ?concentration of SARS-CoV-2 viral copies this assay can detect is ?138 copies/mL. A negative result does not preclude SARS-Cov-2 ?infection and should not be used as the sole basis for treatment or ?other patient management decisions. A negative result may occur with  ?improper specimen collection/handling, submission of specimen other ?than nasopharyngeal swab, presence of viral mutation(s) within the ?areas  targeted by this assay, and inadequate number of viral ?copies(<138 copies/mL). A negative result must be combined with ?clinical observations, patient history, and epidemiological ?information. The expected r

## 2022-03-15 DIAGNOSIS — J449 Chronic obstructive pulmonary disease, unspecified: Secondary | ICD-10-CM | POA: Diagnosis not present

## 2022-03-15 DIAGNOSIS — R319 Hematuria, unspecified: Secondary | ICD-10-CM | POA: Diagnosis not present

## 2022-03-15 DIAGNOSIS — H547 Unspecified visual loss: Secondary | ICD-10-CM | POA: Diagnosis not present

## 2022-03-15 DIAGNOSIS — I209 Angina pectoris, unspecified: Secondary | ICD-10-CM | POA: Diagnosis not present

## 2022-03-15 DIAGNOSIS — R279 Unspecified lack of coordination: Secondary | ICD-10-CM | POA: Diagnosis not present

## 2022-03-15 DIAGNOSIS — L89103 Pressure ulcer of unspecified part of back, stage 3: Secondary | ICD-10-CM | POA: Diagnosis not present

## 2022-03-15 DIAGNOSIS — Z743 Need for continuous supervision: Secondary | ICD-10-CM | POA: Diagnosis not present

## 2022-03-15 DIAGNOSIS — H9190 Unspecified hearing loss, unspecified ear: Secondary | ICD-10-CM | POA: Diagnosis not present

## 2022-03-15 DIAGNOSIS — N1330 Unspecified hydronephrosis: Secondary | ICD-10-CM | POA: Diagnosis not present

## 2022-03-15 DIAGNOSIS — G9341 Metabolic encephalopathy: Secondary | ICD-10-CM | POA: Diagnosis not present

## 2022-03-15 DIAGNOSIS — E559 Vitamin D deficiency, unspecified: Secondary | ICD-10-CM | POA: Diagnosis not present

## 2022-03-15 DIAGNOSIS — I480 Paroxysmal atrial fibrillation: Secondary | ICD-10-CM | POA: Diagnosis not present

## 2022-03-15 DIAGNOSIS — I10 Essential (primary) hypertension: Secondary | ICD-10-CM | POA: Diagnosis not present

## 2022-03-15 DIAGNOSIS — K219 Gastro-esophageal reflux disease without esophagitis: Secondary | ICD-10-CM | POA: Diagnosis not present

## 2022-03-15 DIAGNOSIS — Z8614 Personal history of Methicillin resistant Staphylococcus aureus infection: Secondary | ICD-10-CM | POA: Diagnosis not present

## 2022-03-15 DIAGNOSIS — E43 Unspecified severe protein-calorie malnutrition: Secondary | ICD-10-CM | POA: Diagnosis not present

## 2022-03-15 DIAGNOSIS — D649 Anemia, unspecified: Secondary | ICD-10-CM | POA: Diagnosis not present

## 2022-03-15 DIAGNOSIS — H548 Legal blindness, as defined in USA: Secondary | ICD-10-CM | POA: Diagnosis not present

## 2022-03-15 DIAGNOSIS — I5030 Unspecified diastolic (congestive) heart failure: Secondary | ICD-10-CM | POA: Diagnosis not present

## 2022-03-15 DIAGNOSIS — H409 Unspecified glaucoma: Secondary | ICD-10-CM | POA: Diagnosis not present

## 2022-03-15 DIAGNOSIS — N200 Calculus of kidney: Secondary | ICD-10-CM | POA: Diagnosis not present

## 2022-03-15 DIAGNOSIS — Z960 Presence of urogenital implants: Secondary | ICD-10-CM | POA: Diagnosis not present

## 2022-03-15 DIAGNOSIS — L89152 Pressure ulcer of sacral region, stage 2: Secondary | ICD-10-CM | POA: Diagnosis not present

## 2022-03-15 LAB — BASIC METABOLIC PANEL
Anion gap: 4 — ABNORMAL LOW (ref 5–15)
BUN: 6 mg/dL — ABNORMAL LOW (ref 8–23)
CO2: 23 mmol/L (ref 22–32)
Calcium: 8.1 mg/dL — ABNORMAL LOW (ref 8.9–10.3)
Chloride: 111 mmol/L (ref 98–111)
Creatinine, Ser: 0.64 mg/dL (ref 0.44–1.00)
GFR, Estimated: >60 mL/min (ref >60)
Glucose, Bld: 92 mg/dL (ref 70–99)
Potassium: 4.1 mmol/L (ref 3.5–5.1)
Sodium: 138 mmol/L (ref 135–145)

## 2022-03-15 LAB — CBC
HCT: 29 % — ABNORMAL LOW (ref 36.0–46.0)
Hemoglobin: 9.3 g/dL — ABNORMAL LOW (ref 12.0–15.0)
MCH: 25.1 pg — ABNORMAL LOW (ref 26.0–34.0)
MCHC: 32.1 g/dL (ref 30.0–36.0)
MCV: 78.4 fL — ABNORMAL LOW (ref 80.0–100.0)
Platelets: 495 10*3/uL — ABNORMAL HIGH (ref 150–400)
RBC: 3.7 MIL/uL — ABNORMAL LOW (ref 3.87–5.11)
RDW: 19.6 % — ABNORMAL HIGH (ref 11.5–15.5)
WBC: 9.9 10*3/uL (ref 4.0–10.5)
nRBC: 0 % (ref 0.0–0.2)

## 2022-03-15 NOTE — Progress Notes (Signed)
RNCM received secure chat from Ander Purpura, LPN advising patient's daughter [name] is requesting a call. ? ?This RNCM spoke with [name] who advised she has an appointment with [name] at [facility] today at [place] is questioning authorization due to noone mentioned that to her until today. This RNCM spoke with [facility] who stated admission coordinator is off today however she will have someone to give a call back. ? ?This RNCM unable to obtain insurance auth via [place] due to closure message.   ?

## 2022-03-15 NOTE — Plan of Care (Signed)

## 2022-03-15 NOTE — TOC Progression Note (Signed)
Transition of Care (TOC) - Progression Note  ?  ?  ?Patient Details  ?Name: EMIAH PELLICANO ?MRN: [id number] ?Date of Birth: 1926/04/04 ?  ?Transition of Care (TOC) CM/SW Contact  ?Aurie Harroun, Worthy Rancher, BSN, RN, CCM ?Phone Number: ?03/15/2022, 11:26 AM ?  ?Clinical Narrative:    ?RNCM called to verify status of insurance authorization as patient is up for discharge. [facility] is closed, [facility] is still pending.  TOC will continue to follow.  ?  ?  ?Expected Discharge Plan: Skilled Nursing Facility ?Barriers to Discharge: Continued Medical Work up, pending auth ?  ?Expected Discharge Plan and Services ?Expected Discharge Plan: Skilled Nursing Facility ?Discharge Planning Services: CM Consult ?Post Acute Care Choice: Skilled Nursing Facility ?Living arrangements for the past 2 months: Single Family Home ?                      ?  ?  ?Social Determinants of Health (SDOH) Interventions ?

## 2022-03-15 NOTE — Progress Notes (Signed)
Patient rested well.  No new issues this shift.  ?

## 2022-03-15 NOTE — Progress Notes (Addendum)
This patient has been approved via [employer]. RNCM spoke with Melissa with [facility] who is meeting with the patient's daughter today at [place] notified LPN.  ? ?Per Melissa no additional documents needed for acceptance. RNCM faxed discharge summary to [phone].Marland Kitchen   ?

## 2022-03-15 NOTE — Progress Notes (Signed)
Report called to [employer] , ?

## 2022-03-15 NOTE — Discharge Summary (Signed)
?[facility] ? ?Physician Discharge Summary  ? ?Patient ID: ?[name] ?MRN: [id number] ?DOB/AGE: 02-27-1926 88 y.o. ? ?Admit date: 03/11/2022 ?Discharge date:   03/15/2022 ? ? ?PCP: Sharilyn Sites, MD ? ?DISCHARGE DIAGNOSES:  ?Acute metabolic encephalopathy is, resolved ?Left-sided hydronephrosis ?Urinary tract infection with MRSA ?Essential hypertension ?Normocytic anemia ?History of COPD ?Blindness ?Anorexia ? ?RECOMMENDATIONS FOR OUTPATIENT FOLLOW UP: ?Check CBC and basic metabolic panel in 1 week ?Palliative team consultation is recommended ?Follow-up with Dr. Alinda Money with urology in 2 weeks ? ? ? ?Home Health: SNF ?Equipment/Devices: None ? ?CODE STATUS: Full code ? ?DISCHARGE CONDITION: fair ? ?Diet recommendation: Regular as tolerated ? ?INITIAL HISTORY: ?88 y.o. female with medical history significant for COPD, blindness, GERD and renal stones. Patient was brought to the hospital by her family due to worsening confusion. She was admitted to the hospital on 02/26/22 for left ureteroscopic laser lithotripsy and stent placement by Dr Alinda Money. The day before admission the stent was removed. Patient was noted to have worsening mentation since the stent removal procedure, patient confused, disorientated and agitated. Not able to sleep for 24 hrs and poor oral intake.  ?  ?Consultants: Urology  ?  ?Procedures:  ?On 03/12/22: ?1 cystoscopy ?2. Left retrograde pyelography ?3.  Intraoperative fluoroscopy, under one hour, with interpretation ?4.  Left ureteroscopic stone manipulation with basket extraction ?5.  Left 6 x 26 JJ stent placement ?  ? ? ?HOSPITAL COURSE:  ? ?Acute metabolic encephalopathy ?This was likely due to acute illness.  No focal neurological deficits was noted.  UTI likely contributed as well.   ?CT scan of the head did not show any acute findings. ?Patient mentation has improved and possibly back to baseline now. ? ?Left-sided hydronephrosis/urinary tract infection ?Likely secondary to stone  burden.   ?Seen by urology.  Underwent stone extraction and stent placement on 5/11. ?She was hospitalized in April for left ureteroscopic laser lithotripsy and stent placement by Dr. Alinda Money.  Looks like the stent was removed at some point in time as the CT scan done at the time of admission did not show any stent in place.   ?A new stent was placed on 5/11 as mentioned above. ?Urine culture positive for MRSA.  Likely due to recent instrumentation.  Sensitivities reviewed.  Patient with allergy to sulfa antibiotics and to doxycycline.  ?Patient was on ceftriaxone at the time of admission.  Discussed with infectious disease.  Dalbavancin x1 has been administered.  No further antibiotics at this time. Blood cultures were negative. ?Defer further management of the hydronephrosis to urology. ?  ?Essential hypertension ?Continue diltiazem.  Blood pressure appears to be reasonably well controlled. ?  ?Hypokalemia/normal anion gap metabolic acidosis ?Potassium has improved.  Magnesium 1.7.  Metabolic acidosis is improved.  Likely from hypovolemia.  ?  ?Normocytic anemia ?Hemoglobin is stable.  No evidence of overt bleeding. ?  ?COPD ?Respiratory status noted to be stable.  Continue to monitor ?  ?Blindness ?Continue with eyedrops ?  ?Anorexia ?Did have poor appetite during her stay here.  Seems to be improving gradually.  Continue Ensure.  Encourage oral intake.   ? ?Patient is stable.  Okay for discharge to SNF when bed is available. ? ? ?PERTINENT LABS: ? ?The results of significant diagnostics from this hospitalization (including imaging, microbiology, ancillary and laboratory) are listed below for reference.   ? ?Microbiology: ?Recent Results (from the past 240 hour(s))  ?Resp Panel by RT-PCR (Flu A&B, Covid) Nasopharyngeal Swab  Status: None  ? Collection Time: 03/11/22  7:31 PM  ? Specimen: Nasopharyngeal Swab; Nasopharyngeal(NP) swabs in vial transport medium  ?Result Value Ref Range Status  ? SARS Coronavirus 2  by RT PCR NEGATIVE NEGATIVE Final  ?  Comment: (NOTE) ?SARS-CoV-2 target nucleic acids are NOT DETECTED. ? ?The SARS-CoV-2 RNA is generally detectable in upper respiratory ?specimens during the acute phase of infection. The lowest ?concentration of SARS-CoV-2 viral copies this assay can detect is ?138 copies/mL. A negative result does not preclude SARS-Cov-2 ?infection and should not be used as the sole basis for treatment or ?other patient management decisions. A negative result may occur with  ?improper specimen collection/handling, submission of specimen other ?than nasopharyngeal swab, presence of viral mutation(s) within the ?areas targeted by this assay, and inadequate number of viral ?copies(<138 copies/mL). A negative result must be combined with ?clinical observations, patient history, and epidemiological ?information. The expected result is Negative. ? ?Fact Sheet for Patients:  ?EntrepreneurPulse.com.au ? ?Fact Sheet for Healthcare Providers:  ?IncredibleEmployment.be ? ?This test is no t yet approved or cleared by the [place] FDA and  ?has been authorized for detection and/or diagnosis of SARS-CoV-2 by ?FDA under an Emergency Use Authorization (EUA). This EUA will remain  ?in effect (meaning this test can be used) for the duration of the ?COVID-19 declaration under Section 564(b)(1) of the Act, 21 ?U.S.C.section 360bbb-3(b)(1), unless the authorization is terminated  ?or revoked sooner.  ? ? ?  ? Influenza A by PCR NEGATIVE NEGATIVE Final  ? Influenza B by PCR NEGATIVE NEGATIVE Final  ?  Comment: (NOTE) ?The Xpert Xpress SARS-CoV-2/FLU/RSV plus assay is intended as an aid ?in the diagnosis of influenza from Nasopharyngeal swab specimens and ?should not be used as a sole basis for treatment. Nasal washings and ?aspirates are unacceptable for Xpert Xpress SARS-CoV-2/FLU/RSV ?testing. ? ?Fact Sheet for Patients: ?EntrepreneurPulse.com.au ? ?Fact  Sheet for Healthcare Providers: ?IncredibleEmployment.be ? ?This test is not yet approved or cleared by the [place] FDA and ?has been authorized for detection and/or diagnosis of SARS-CoV-2 by ?FDA under an Emergency Use Authorization (EUA). This EUA will remain ?in effect (meaning this test can be used) for the duration of the ?COVID-19 declaration under Section 564(b)(1) of the Act, 21 U.S.C. ?section 360bbb-3(b)(1), unless the authorization is terminated or ?revoked. ? ?Performed at [facility], [address], [place], [place] [address] ?  ?Culture, blood (routine x 2)     Status: None (Preliminary result)  ? Collection Time: 03/11/22  7:31 PM  ? Specimen: BLOOD RIGHT HAND  ?Result Value Ref Range Status  ? Specimen Description BLOOD RIGHT HAND  Final  ? Special Requests   Final  ?  BOTTLES DRAWN AEROBIC AND ANAEROBIC Blood Culture adequate volume  ? Culture   Final  ?  NO GROWTH 4 DAYS ?Performed at [facility], [address], [place],  [address] ?  ? Report Status PENDING  Incomplete  ?Culture, blood (routine x 2)     Status: None (Preliminary result)  ? Collection Time: 03/11/22  8:18 PM  ? Specimen: BLOOD RIGHT ARM  ?Result Value Ref Range Status  ? Specimen Description BLOOD RIGHT ARM  Final  ? Special Requests   Final  ?  BOTTLES DRAWN AEROBIC AND ANAEROBIC Blood Culture adequate volume  ? Culture   Final  ?  NO GROWTH 4 DAYS ?Performed at [facility], [address], [place],  [address] ?  ? Report Status PENDING  Incomplete  ?  Urine Culture     Status: Abnormal  ? Collection Time: 03/11/22 10:18 PM  ? Specimen: Urine, Clean Catch  ?Result Value Ref Range Status  ? Specimen Description   Final  ?  URINE, CLEAN CATCH ?Performed at [facility], [address], [place], [place] [address] ?  ? Special Requests   Final  ?  NONE ?Performed at [facility], [address], [place], [place] [address] ?  ? Culture (A)  Final  ?  30,000 COLONIES/mL METHICILLIN RESISTANT  STAPHYLOCOCCUS AUREUS CORRECTED ON 05/13 AT 0855: PREVIOUSLY REPORTED AS 30,000 COLONIES/mL STAPHYLOCOCCUS AUREUS  ? Report Status 03/14/2022 FINAL  Final  ? Organism ID, Bacteria METHICILLIN RESISTANT STAPHYLOCO

## 2022-03-15 NOTE — Progress Notes (Signed)
Pharmacy Antibiotic Note ? ?[name] is a 88 y.o. female admitted on 03/11/2022 with cellulitis and UTI.  Pharmacy has been consulted for dalbavancin dosing. ? ?Plan: ?Dalbavancin 1.125gm iv x 1 dose 5/13. Proceed despite inpatient per Dr Earlene Plater and Dr Rito Ehrlich.  ? ?  ? ?Temp (24hrs), Avg:97.4 ?F (36.3 ?C), Min:97.1 ?F (36.2 ?C), Max:97.5 ?F (36.4 ?C) ? ?Recent Labs  ?Lab 03/11/22 ?1931 03/11/22 ?2018 03/11/22 ?2208 03/12/22 ?0430 03/13/22 ?5284 03/14/22 ?0500 03/15/22 ?1324  ?WBC 10.9*  --   --  13.1* 11.0* 9.7 9.9  ?CREATININE 0.76  --   --  0.76 0.64 0.67 0.64  ?LATICACIDVEN  --  1.2 1.1  --   --   --   --   ?  ?CrCl cannot be calculated (Unknown ideal weight.).   ? ?Allergies  ?Allergen Reactions  ? Iohexol Itching, Swelling and Rash  ?  pt allergic to prednisone she needs IV premeds  ? Strawberry (Diagnostic)   ? Sulfa Antibiotics   ? Travatan [Travoprost] Itching  ? Xalatan [Latanoprost] Itching  ? Azithromycin Rash  ? Doxycycline Rash  ? Gentamicin Rash  ? Ibandronic Acid Rash  ? Nitrofurantoin Rash  ? Penicillins Rash  ? Prednisone Rash  ? ? ?Antimicrobials this admission: ?5/10 ceftriaxone >> 5/12 ?5/13 dalbavancin x 1 ? ?Microbiology results: ?5/10 BCx: NGTD ?5/10 UCx: 30k MRSA, sens vancomycin  ?5/10 MRSA PCR: positive ? ?Thank you for allowing pharmacy to be a part of this patient?s care. ? ?Gerre Pebbles Maisley Hainsworth ?03/15/2022 8:56 AM ? ?

## 2022-03-16 ENCOUNTER — Encounter (HOSPITAL_COMMUNITY): Payer: Self-pay | Admitting: Urology

## 2022-03-16 DIAGNOSIS — N200 Calculus of kidney: Secondary | ICD-10-CM | POA: Diagnosis not present

## 2022-03-16 DIAGNOSIS — N1330 Unspecified hydronephrosis: Secondary | ICD-10-CM | POA: Diagnosis not present

## 2022-03-16 DIAGNOSIS — G9341 Metabolic encephalopathy: Secondary | ICD-10-CM | POA: Diagnosis not present

## 2022-03-16 LAB — CULTURE, BLOOD (ROUTINE X 2)
Culture: NO GROWTH
Culture: NO GROWTH
Special Requests: ADEQUATE
Special Requests: ADEQUATE

## 2022-03-17 DIAGNOSIS — J449 Chronic obstructive pulmonary disease, unspecified: Secondary | ICD-10-CM | POA: Diagnosis not present

## 2022-03-17 DIAGNOSIS — G9341 Metabolic encephalopathy: Secondary | ICD-10-CM | POA: Diagnosis not present

## 2022-03-17 DIAGNOSIS — N200 Calculus of kidney: Secondary | ICD-10-CM | POA: Diagnosis not present

## 2022-03-17 DIAGNOSIS — N1330 Unspecified hydronephrosis: Secondary | ICD-10-CM | POA: Diagnosis not present

## 2022-03-18 DIAGNOSIS — D649 Anemia, unspecified: Secondary | ICD-10-CM | POA: Diagnosis not present

## 2022-03-18 DIAGNOSIS — H547 Unspecified visual loss: Secondary | ICD-10-CM | POA: Diagnosis not present

## 2022-03-18 DIAGNOSIS — H409 Unspecified glaucoma: Secondary | ICD-10-CM | POA: Diagnosis not present

## 2022-03-18 DIAGNOSIS — I10 Essential (primary) hypertension: Secondary | ICD-10-CM | POA: Diagnosis not present

## 2022-03-18 LAB — CALCULI, WITH PHOTOGRAPH (CLINICAL LAB)
Calcium Oxalate Dihydrate: 50 %
Calcium Oxalate Monohydrate: 45 %
Hydroxyapatite: 5 %
Weight Calculi: 122 mg

## 2022-03-19 DIAGNOSIS — K219 Gastro-esophageal reflux disease without esophagitis: Secondary | ICD-10-CM | POA: Diagnosis not present

## 2022-03-19 DIAGNOSIS — I480 Paroxysmal atrial fibrillation: Secondary | ICD-10-CM | POA: Diagnosis not present

## 2022-03-19 DIAGNOSIS — D649 Anemia, unspecified: Secondary | ICD-10-CM | POA: Diagnosis not present

## 2022-03-19 DIAGNOSIS — G9341 Metabolic encephalopathy: Secondary | ICD-10-CM | POA: Diagnosis not present

## 2022-03-24 DIAGNOSIS — L89152 Pressure ulcer of sacral region, stage 2: Secondary | ICD-10-CM | POA: Diagnosis not present

## 2022-03-24 DIAGNOSIS — R52 Pain, unspecified: Secondary | ICD-10-CM | POA: Diagnosis not present

## 2022-03-24 DIAGNOSIS — L89103 Pressure ulcer of unspecified part of back, stage 3: Secondary | ICD-10-CM | POA: Diagnosis not present

## 2022-03-27 ENCOUNTER — Ambulatory Visit: Payer: Medicare Other | Admitting: Internal Medicine

## 2022-03-27 ENCOUNTER — Ambulatory Visit: Payer: Medicare Other | Admitting: Urology

## 2022-04-01 ENCOUNTER — Ambulatory Visit: Payer: Medicare Other | Admitting: Urology

## 2022-04-07 DIAGNOSIS — H547 Unspecified visual loss: Secondary | ICD-10-CM | POA: Diagnosis not present

## 2022-04-07 DIAGNOSIS — Z79899 Other long term (current) drug therapy: Secondary | ICD-10-CM | POA: Diagnosis not present

## 2022-04-29 DIAGNOSIS — N200 Calculus of kidney: Secondary | ICD-10-CM | POA: Diagnosis not present

## 2022-04-29 DIAGNOSIS — I10 Essential (primary) hypertension: Secondary | ICD-10-CM | POA: Diagnosis not present

## 2022-04-29 DIAGNOSIS — N1330 Unspecified hydronephrosis: Secondary | ICD-10-CM | POA: Diagnosis not present

## 2022-04-29 DIAGNOSIS — J449 Chronic obstructive pulmonary disease, unspecified: Secondary | ICD-10-CM | POA: Diagnosis not present

## 2022-06-02 DEATH — deceased

## 2022-11-18 IMAGING — CT CT RENAL STONE PROTOCOL
2 of 7 series · 15 of 46 positions shown, 17 images · non-contrast
Comparison: 03/29/2008.

CLINICAL DATA: Patient brought in by [facility] for left sided back
pain/flank pain that started last night. EMS states that the family
said patient has a hx of kidney stones. Patient given 30mg Toradol
with some relief.



[Series 2: axial st · axial · 0.80mm/px · z∈[-250,+60]mm · 12 of 72 slices shown, 14 images]
[im 5/72  soft-tissue]
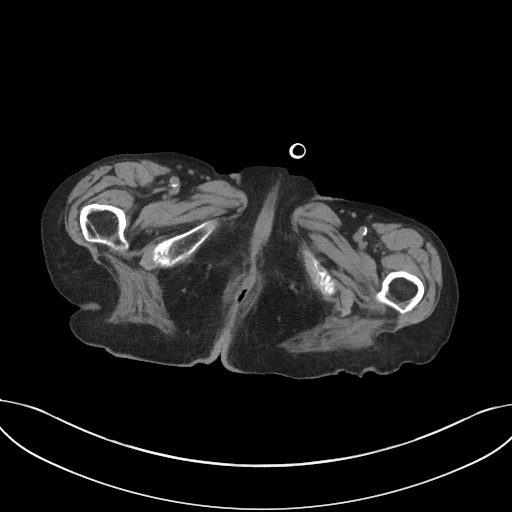
[im 5/72  bone]
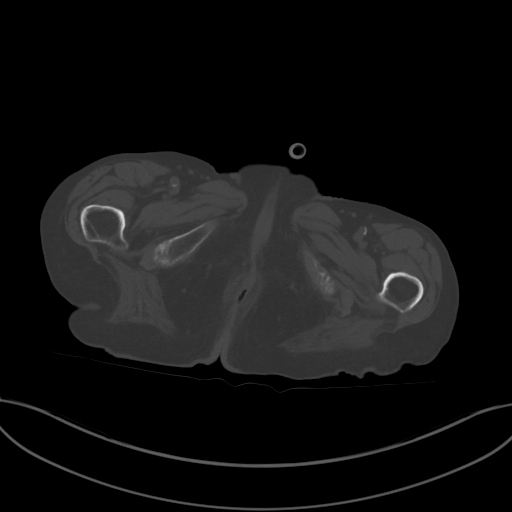
[im 9/72  soft-tissue]
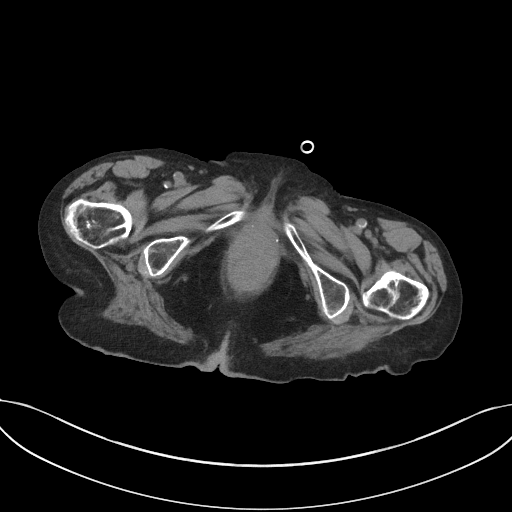
[im 18/72  soft-tissue]
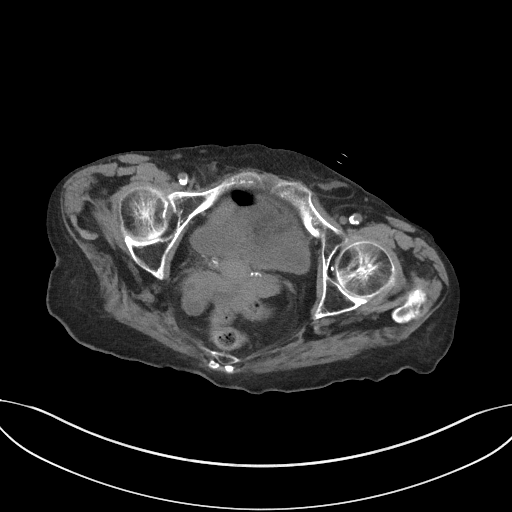
[im 23/72  soft-tissue]
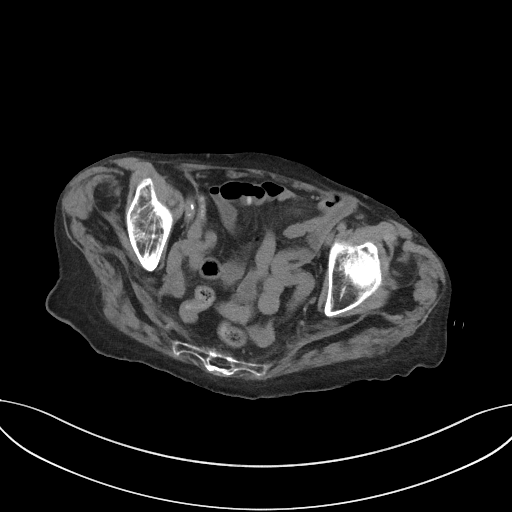
[im 27/72  soft-tissue]
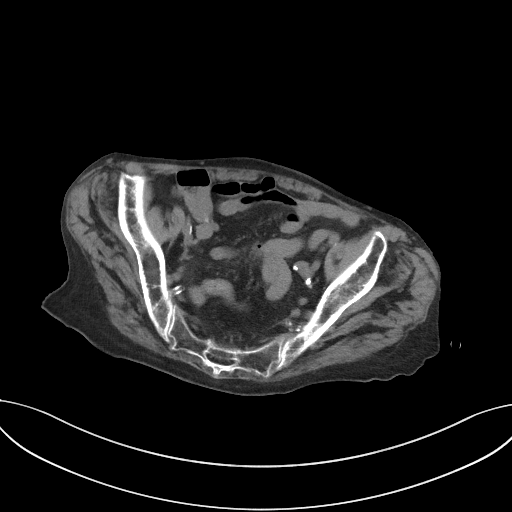
[im 32/72  soft-tissue]
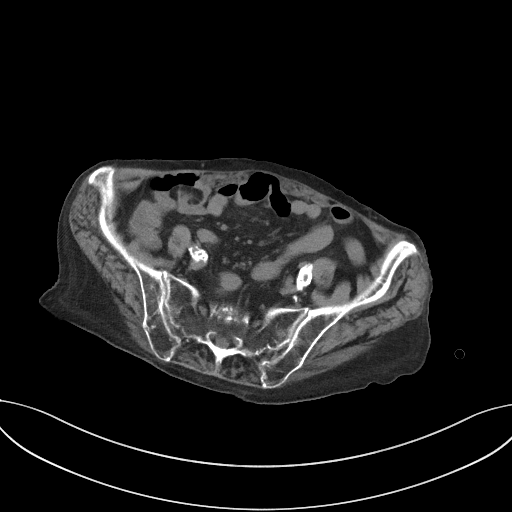
[im 40/72  soft-tissue]
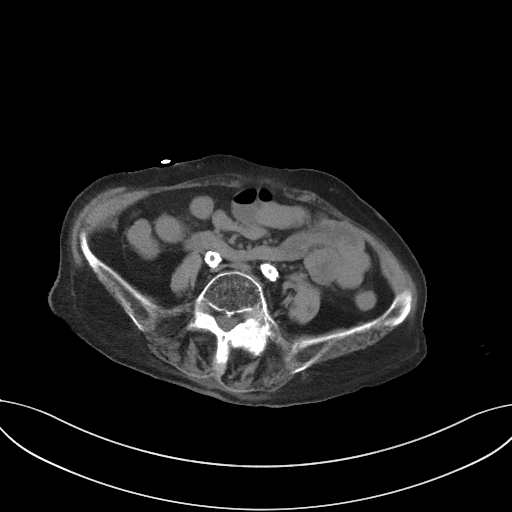
[im 45/72  soft-tissue]
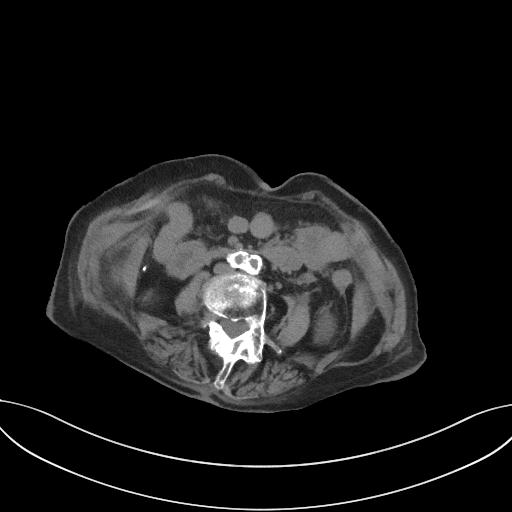
[im 49/72  soft-tissue]
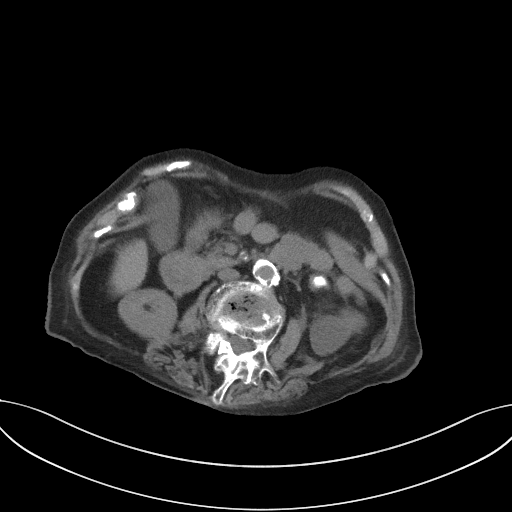
[im 49/72  bone]
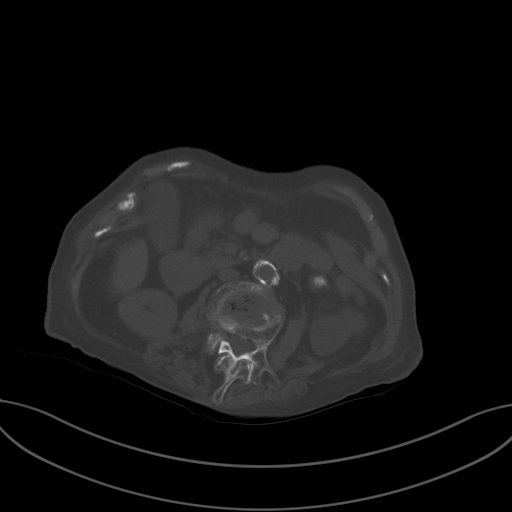
[im 54/72  soft-tissue]
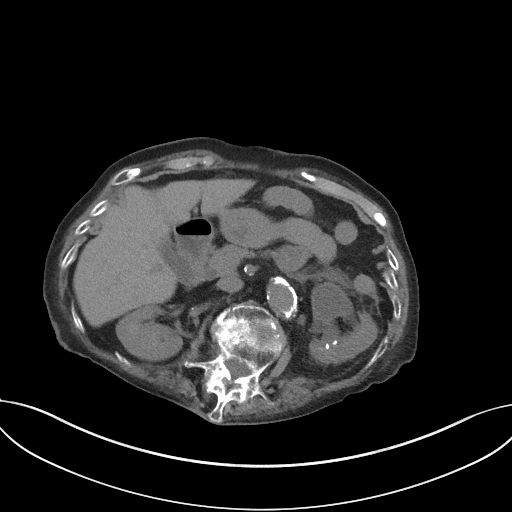
[im 63/72  soft-tissue]
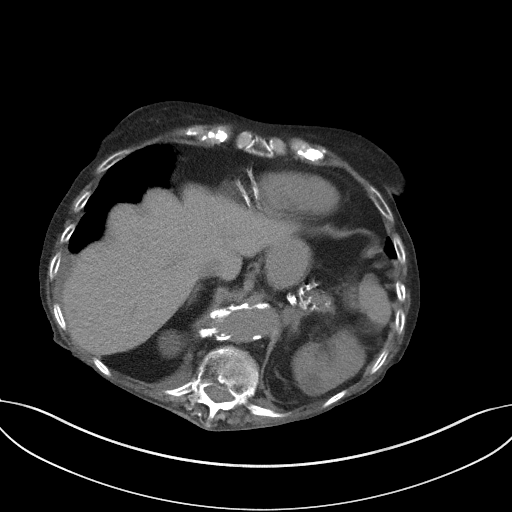
[im 67/72  soft-tissue]
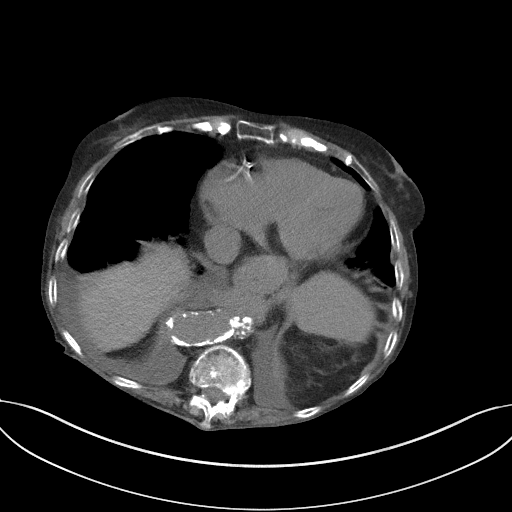

[Series 5: coronal st · coronal · 0.68mm/px · 3 of 97 slices shown]
[im 25/97  soft-tissue]
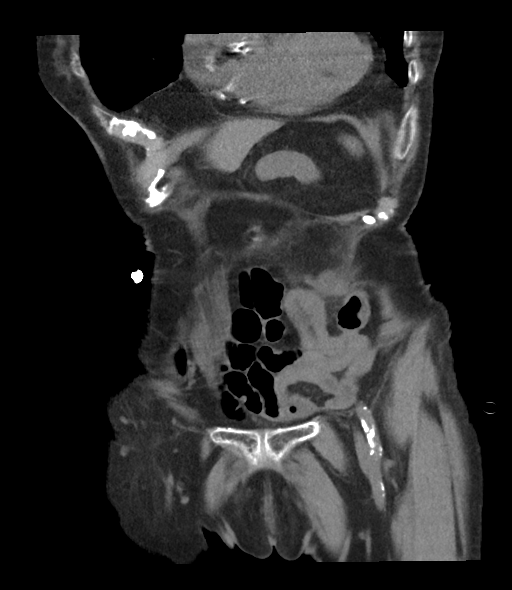
[im 49/97  soft-tissue]
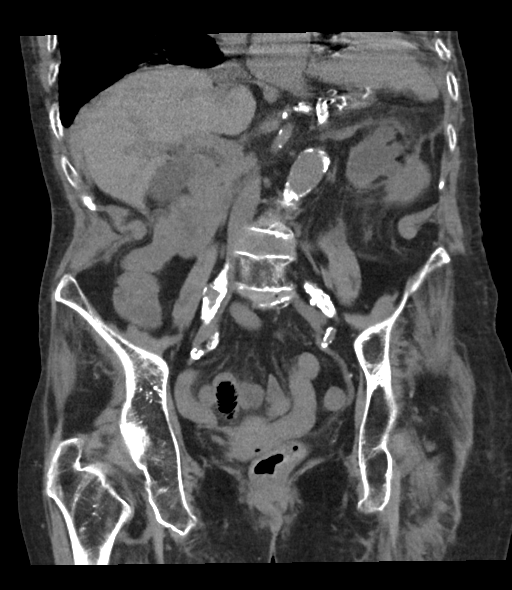
[im 73/97  soft-tissue]
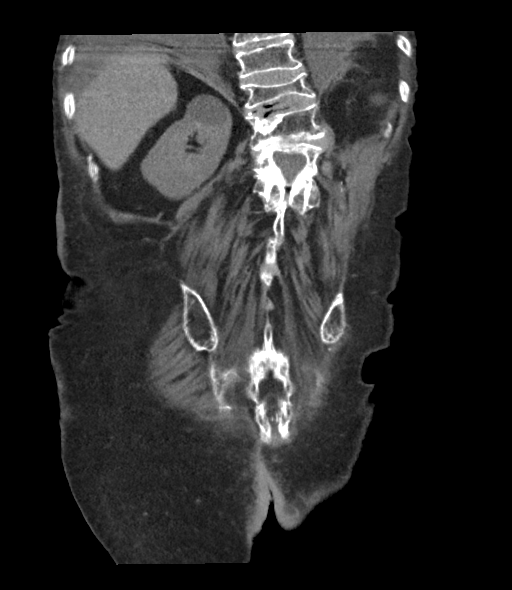

[15 of 46 positions shown; findings below may reference images not displayed]

FINDINGS: Lower chest: Small to moderate right and small left pleural
effusions associated with dependent lower lobe opacity consistent
with atelectasis.

Hepatobiliary: No focal liver abnormality is seen. No gallstones,
gallbladder wall thickening, or biliary dilatation.

Pancreas: Unremarkable. No pancreatic ductal dilatation or
surrounding inflammatory changes.

Spleen: Normal in size without focal abnormality.

Adrenals/Urinary Tract: No adrenal masses.

Large stone, 2 cm in size, extends from the inferior left renal
pelvis to the ureteropelvic junction. There is associated moderate
left hydronephrosis and left perinephric stranding.

Small nonobstructing stones in the posterior lower pole of the left
kidney. No right intrarenal stones or hydronephrosis. Low-density
mass arises from the upper pole the right kidney measuring 2.8 cm.
There is a 1.4 cm low-density mass arising from the upper pole of
the left kidney and a 3 cm low-density mass arising from the lower
pole of the left kidney. These are consistent with cysts.

Left ureter below the ureteropelvic stone is normal in course and in
caliber. No additional stones. Normal right ureter.

Bladder is unremarkable.

Stomach/Bowel: Moderate hiatal hernia, incompletely imaged. Stomach
otherwise unremarkable. Small-bowel is unremarkable. Colon is mostly
decompressed. No bowel wall thickening or inflammatory changes. No
evidence of appendicitis.

Vascular/Lymphatic: Dense aortic and branch vessel atherosclerotic
calcifications. No aneurysm. No enlarged lymph nodes.

Reproductive: Uterus and bilateral adnexa are unremarkable.

Other: No hernia or ascites.

Musculoskeletal: Loss vertebral body height throughout the
visualized spine consistent with chronic fractures, most
significantly of T12 and L1. L1 fracture is increased from the prior
CT and the remaining fractures are new, but none appear acute. No
bone lesions.
IMPRESSION: 1. 2 cm stone at the left ureteropelvic junction causes moderate
left hydronephrosis.
2. No other acute abnormality within the abdomen or pelvis.
3. Small nonobstructing stones in the lower pole of the left kidney.
4. Multiple vertebral fractures from T11-L5, all presumed old,
however most new since the prior CT.
5. Extensive aortic and branch vessel atherosclerotic
calcifications.

## 2023-02-18 IMAGING — CT CT RENAL STONE PROTOCOL
2 of 5 series · 14 of 46 positions shown, 16 images · non-contrast
Comparison: November 29, 2021.

CLINICAL DATA: [AGE] female presents for evaluation of
nephrolithiasis.



[Series 3: thins · axial · 0.76mm/px · z∈[+889,+1219]mm · 11 of 364 slices shown, 13 images]
[im 17/364  soft-tissue]
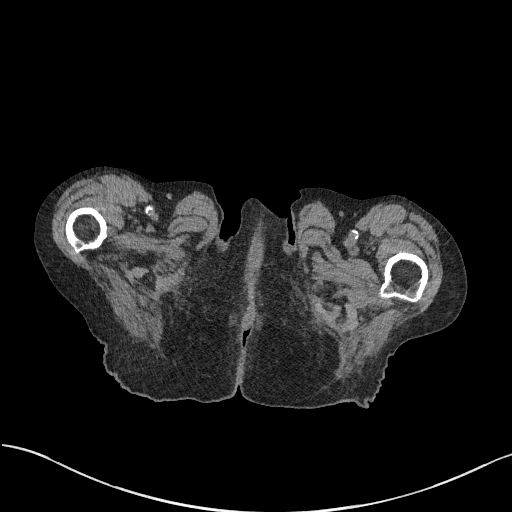
[im 17/364  bone]
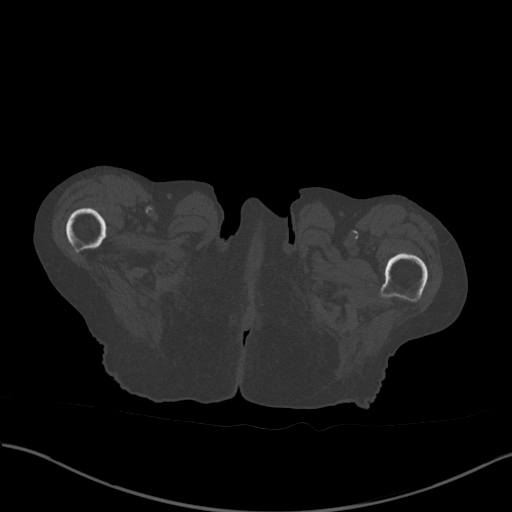
[im 50/364  soft-tissue]
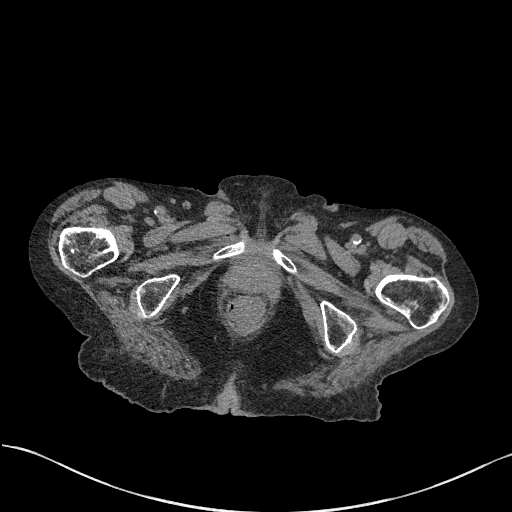
[im 83/364  soft-tissue]
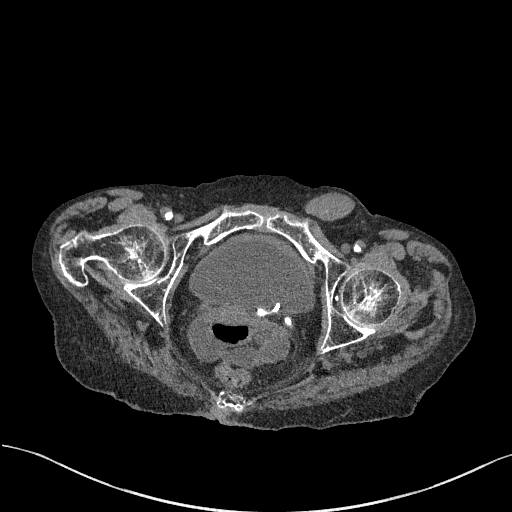
[im 116/364  soft-tissue]
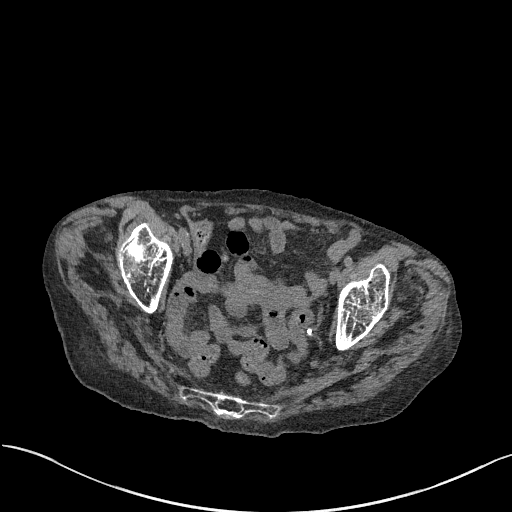
[im 149/364  soft-tissue]
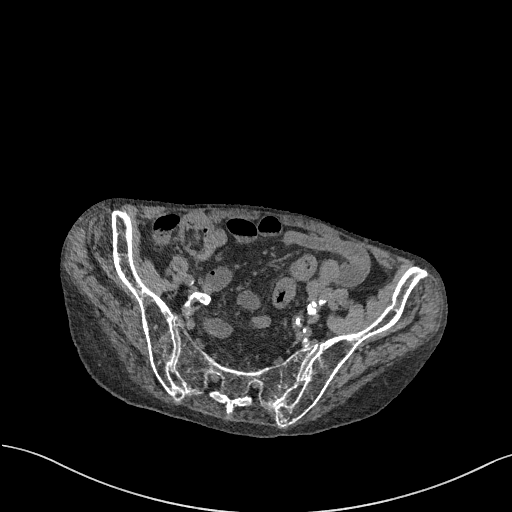
[im 182/364  soft-tissue]
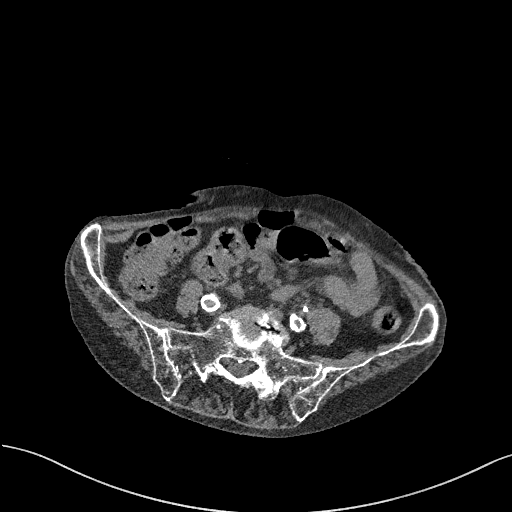
[im 215/364  soft-tissue]
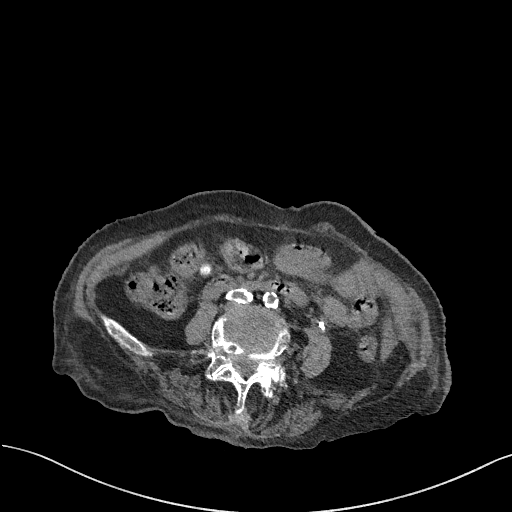
[im 248/364  soft-tissue]
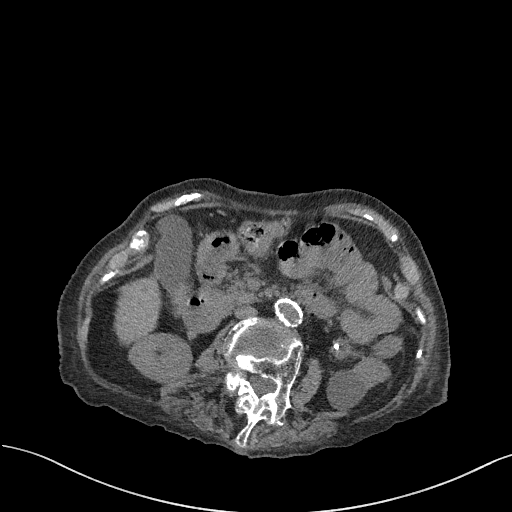
[im 281/364  soft-tissue]
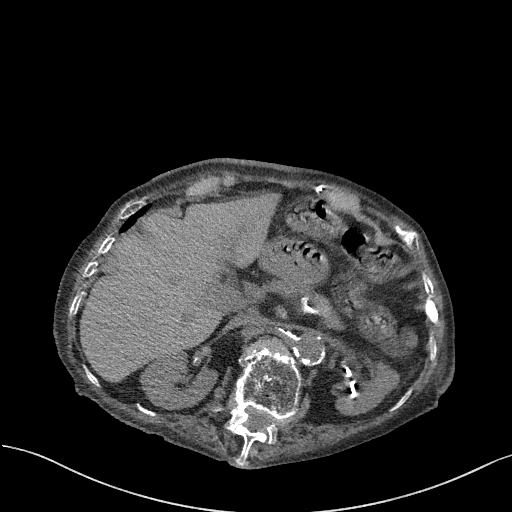
[im 281/364  bone]
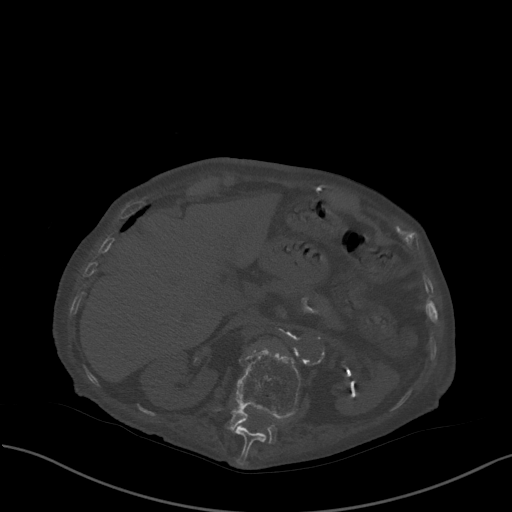
[im 314/364  soft-tissue]
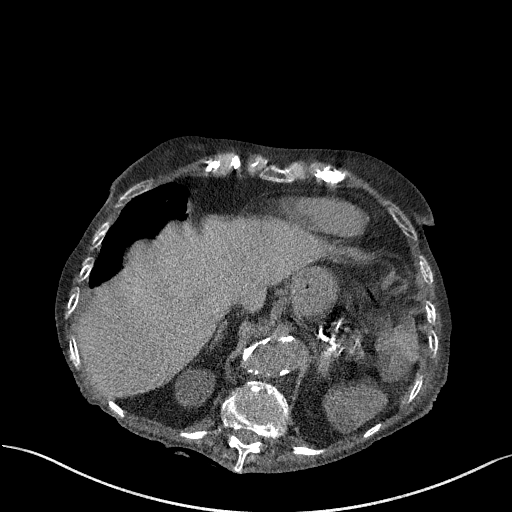
[im 347/364  soft-tissue]
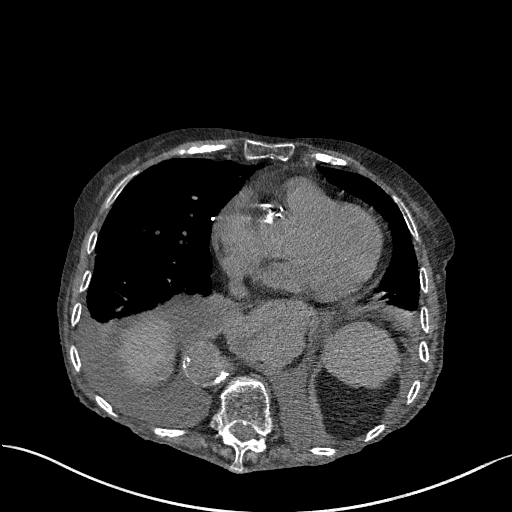

[Series 5: coronal st · coronal · 0.67mm/px · 3 of 93 slices shown]
[im 31/93  soft-tissue]
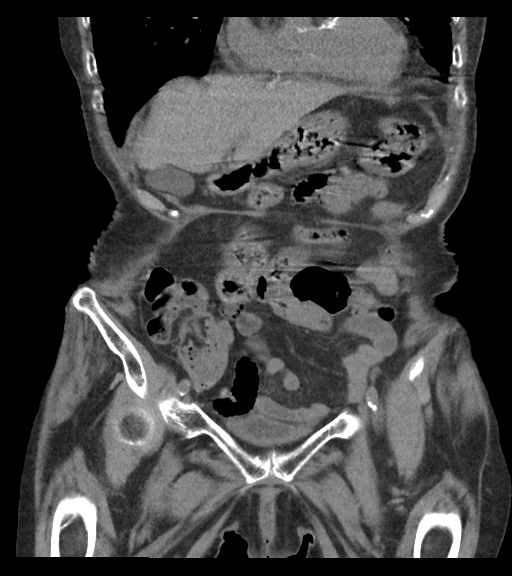
[im 41/93  soft-tissue]
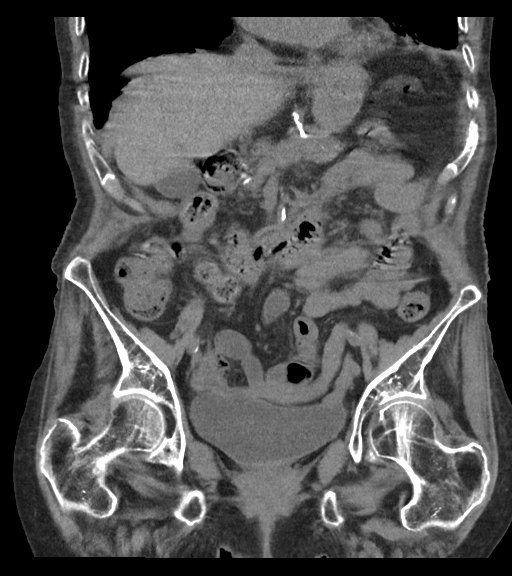
[im 52/93  soft-tissue]
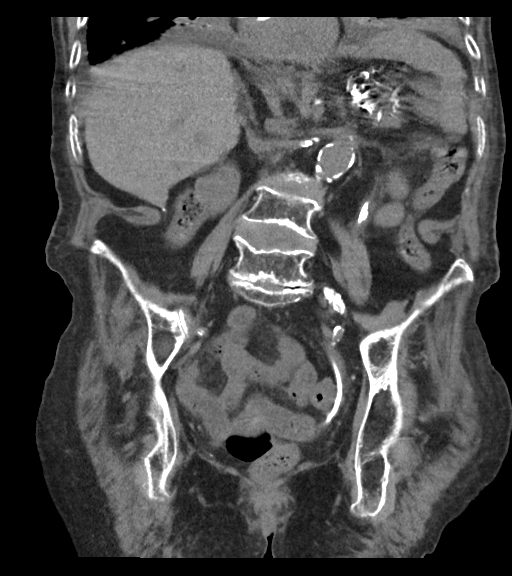

[14 of 46 positions shown; findings below may reference images not displayed]

FINDINGS: Lower chest: Bilateral pleural effusions are similar to previous
imaging and associated with basilar airspace disease as on prior
studies. Calcified coronary artery disease. No pericardial effusion.
Heart is incompletely imaged.

Hepatobiliary: Liver with smooth contours. No visible lesion on
noncontrast imaging. No pericholecystic stranding. No gross biliary
duct distension.

Pancreas: Mild pancreatic atrophy without signs of inflammation.
Normal pancreatic contour.

Spleen: Mildly lobulated contour as on previous imaging. Limited
assessment due to respiratory motion.

Adrenals/Urinary Tract: Mild adrenal thickening bilaterally is
stable with benign appearance.

Interval insertion of a LEFT-sided nephroureteral stent. Radiodense
material with in the dependent collecting systems of the LEFT kidney
with stone fragments. The dominant calculus appears to been
fragmented. Largest collection of calcific change seen in the renal
pelvis approximately 7 x 4 mm with increased density tracking into
collecting system elements in the interpolar and lower pole aspect
of the LEFT kidney. There is no hydronephrosis but with mild
distension of the renal pelvis and also of the ureter. Multiple
calcific fragments are seen along the course of the LEFT
nephroureteral stent. Urinary bladder with smooth contours.

RIGHT kidney with upper pole cyst that is unchanged and not
requiring follow-up. No RIGHT ureteral calculi.

Stomach/Bowel: Hiatal hernia, small hiatal hernia as before. No
acute gastrointestinal findings. Normal appendix. Stool throughout
much of the colon without signs of adjacent stranding.

LEFT inguinal hernia now contains small bowel without signs of
obstruction. There was a trace amount of fluid in this hernia
previously and this is similar. No stranding about the hernia site.

Vascular/Lymphatic:

Aortic atherosclerosis. No sign of aneurysm. Smooth contour of the
IVC. There is no gastrohepatic or hepatoduodenal ligament
lymphadenopathy. No retroperitoneal or mesenteric lymphadenopathy.

No pelvic sidewall lymphadenopathy.

Atherosclerotic changes are marked as before.

Reproductive: Unremarkable by CT.

Other: No ascites.

Musculoskeletal: Osteopenia. Multilevel spinal compression fractures
with marked loss of height of vertebral bodies in the lower thoracic
and in the lumbar spine show no change since previous imaging.
IMPRESSION: 1. Interval fragmentation of the large renal pelvic calculus seen on
the prior study with multiple small stone fragments in the renal
pelvis and along the course of the LEFT nephroureteral stent which
has been inserted in the interval.
2. Mildly patulous appearance of the proximal LEFT ureter and renal
pelvis. Not currently frankly hydronephrotic. Some of this may be
due to longstanding stone in the renal pelvis on the LEFT. Correlate
with worsening symptoms and consider close follow-up to assure
nephroureteral stent function.
3. Interval herniation of small bowel into the LEFT inguinal canal
associated with a small amount of fluid but no stranding. Fluid was
present previously. No signs of obstruction or acute small bowel
process. Correlate physical exam.
4. Bilateral pleural effusions are similar to previous imaging and
associated with basilar airspace disease as on prior studies.
5. Multilevel spinal compression fractures with marked loss of
height of vertebral bodies in the lower thoracic and lumbar spine
show no change since previous imaging.

Aortic Atherosclerosis ([id number]).
